# Patient Record
Sex: Female | Born: 1945 | Race: White | Hispanic: No | Smoking: Current every day smoker
Health system: Southern US, Community
[De-identification: ages and names within clinical notes are randomized; demographics above are authoritative.]

## PROBLEM LIST (undated history)

## (undated) DIAGNOSIS — I10 Essential (primary) hypertension: Secondary | ICD-10-CM

## (undated) DIAGNOSIS — Z860100 Personal history of colon polyps, unspecified: Secondary | ICD-10-CM

## (undated) DIAGNOSIS — F32A Depression, unspecified: Secondary | ICD-10-CM

## (undated) DIAGNOSIS — K219 Gastro-esophageal reflux disease without esophagitis: Secondary | ICD-10-CM

## (undated) DIAGNOSIS — K760 Fatty (change of) liver, not elsewhere classified: Secondary | ICD-10-CM

## (undated) DIAGNOSIS — J449 Chronic obstructive pulmonary disease, unspecified: Secondary | ICD-10-CM

## (undated) DIAGNOSIS — M069 Rheumatoid arthritis, unspecified: Secondary | ICD-10-CM

## (undated) DIAGNOSIS — K52832 Lymphocytic colitis: Secondary | ICD-10-CM

## (undated) DIAGNOSIS — E785 Hyperlipidemia, unspecified: Secondary | ICD-10-CM

## (undated) DIAGNOSIS — F329 Major depressive disorder, single episode, unspecified: Secondary | ICD-10-CM

## (undated) DIAGNOSIS — E039 Hypothyroidism, unspecified: Secondary | ICD-10-CM

## (undated) DIAGNOSIS — T8859XA Other complications of anesthesia, initial encounter: Secondary | ICD-10-CM

## (undated) HISTORY — PX: LEG SURGERY: SHX1003

## (undated) HISTORY — DX: Personal history of colonic polyps: Z86.010

## (undated) HISTORY — PX: GALLBLADDER SURGERY: SHX652

## (undated) HISTORY — PX: COLONOSCOPY: SHX174

## (undated) HISTORY — DX: Major depressive disorder, single episode, unspecified: F32.9

## (undated) HISTORY — DX: Hyperlipidemia, unspecified: E78.5

## (undated) HISTORY — DX: Lymphocytic colitis: K52.832

## (undated) HISTORY — DX: Depression, unspecified: F32.A

## (undated) HISTORY — PX: PARTIAL HYSTERECTOMY: SHX80

## (undated) HISTORY — DX: Gastro-esophageal reflux disease without esophagitis: K21.9

## (undated) HISTORY — DX: Hypothyroidism, unspecified: E03.9

## (undated) HISTORY — PX: OTHER SURGICAL HISTORY: SHX169

## (undated) HISTORY — DX: Personal history of colon polyps, unspecified: Z86.0100

## (undated) HISTORY — DX: Rheumatoid arthritis, unspecified: M06.9

---

## 2014-06-16 ENCOUNTER — Encounter: Payer: Self-pay | Admitting: Internal Medicine

## 2014-07-14 ENCOUNTER — Encounter: Payer: Self-pay | Admitting: Gastroenterology

## 2014-07-14 ENCOUNTER — Ambulatory Visit (INDEPENDENT_AMBULATORY_CARE_PROVIDER_SITE_OTHER): Payer: Medicare Other | Admitting: Gastroenterology

## 2014-07-14 VITALS — BP 116/70 | HR 73 | Temp 97.4°F | Ht 64.0 in | Wt 137.8 lb

## 2014-07-14 DIAGNOSIS — R1013 Epigastric pain: Secondary | ICD-10-CM

## 2014-07-14 DIAGNOSIS — R197 Diarrhea, unspecified: Secondary | ICD-10-CM | POA: Insufficient documentation

## 2014-07-14 DIAGNOSIS — R159 Full incontinence of feces: Secondary | ICD-10-CM | POA: Insufficient documentation

## 2014-07-14 DIAGNOSIS — R1030 Lower abdominal pain, unspecified: Secondary | ICD-10-CM

## 2014-07-14 DIAGNOSIS — K219 Gastro-esophageal reflux disease without esophagitis: Secondary | ICD-10-CM

## 2014-07-14 MED ORDER — COLESTIPOL HCL 1 G PO TABS: 1.0000 g | ORAL_TABLET | Freq: Two times a day (BID) | ORAL | Status: DC

## 2014-07-14 NOTE — Progress Notes (Signed)
Primary Care Physician:  HALL, ZACH, MD  Primary Gastroenterologist:  Michael Rourk, MD   Chief Complaint  Patient presents with  . Encopresis    HPI:  Jennifer [name] is a 68 y.o. female here at the request of Dr. Zach Hall for further evaluation of fecal incontinence and diarrhea. Patient recently had worsening of her baseline symptoms and therefore wanted to be evaluated again. She states she was given the diagnosis of chronic diarrhea but no etiology ever determined. Symptoms began back in 1989 around the time of her gallbladder surgery. States she had her first fecal incontinence episode while she was bathing in a bathtub. During her evaluation at that time she was found to have a gallstone and underwent cholecystectomy. 2 years after her gallbladder was removed she developed chronic diarrhea. At the worst time she was having up to 30 stools per day. Symptoms also associated with fecal incontinence. She reports being evaluated by Dr. Buccini, Dr. McKee, and someone at [facility] over the past couple of decades but not in the last 14 years. Reports having at least 6 colonoscopies and one endoscopy or possibly anorectal manometry versus biofeedback at [facility]. Seen only once at [facility] for the test. About 3 years ago she had her last colonoscopy in [place] but does not remember the name of the physician. She is going to try to get those records. States she has had colon polyps previously. States she's never had a CT scan. Denies any back injury or rectal surgery. No prior small bowel capsule study.   Patient reports having episodes of fecal incontinence while asleep. She will have the urge to have a bowel movement in before she even move she has stool without warning. She ended up going on permanent disability because of this. Wears depends at all times. She has tried multiple over-the-counter agents without relief. However if she takes decongestant Contact she gets constipation.    Currently the patient is having about 12 watery stools daily. Denies any blood in the stool. Continues to have nocturnal diarrhea. Symptoms seem to get worse when she fell out of a truck and landed on her right side. Reports that her heartburn is well-controlled. Denies dysphagia. No unintentional weight loss. No history of urinary incontinence. She has discomfort and the epigastrium and lower abdominal area. Seems to be worse with bowel movements.  Current Outpatient Prescriptions  Medication Sig Dispense Refill  . citalopram (CELEXA) 20 MG tablet Take 20 mg by mouth daily.     . levothyroxine (SYNTHROID, LEVOTHROID) 88 MCG tablet Take 88 mcg by mouth daily before breakfast.     . omeprazole (PRILOSEC) 40 MG capsule Take 40 mg by mouth daily.     . simvastatin (ZOCOR) 20 MG tablet Take 20 mg by mouth daily at 6 PM.      No current facility-administered medications for this visit.    Allergies as of 07/14/2014 - Review Complete 07/14/2014  Allergen Reaction Noted  . Desyrel [trazodone]  07/14/2014  . Keflex [cephalexin]  07/14/2014  . Klonopin [clonazepam]  07/14/2014  . Minocin [minocycline hcl]  07/14/2014  . Stelazine [trifluoperazine]  07/14/2014    Past Medical History  Diagnosis Date  . GERD (gastroesophageal reflux disease)   . Hyperlipidemia   . Hypothyroidism   . Depression     Past Surgical History  Procedure Laterality Date  . Gallbladder surgery    . Partial hysterectomy    . Leg surgery      right  .   Colonoscopy      last one was about 3 yrs in [place]    Family History  Problem Relation Age of Onset  . Colon cancer Neg Hx    patient does not know her family history very well. Not in contact with her family.  History   Social History  . Marital Status: Unknown    Spouse Name: N/A    Number of Children: 2  . Years of Education: N/A   Occupational History  . disability    Social History Main Topics  . Smoking status: Current Some Day Smoker  -- 0.50 packs/day    Types: Cigarettes  . Smokeless tobacco: Not on file  . Alcohol Use: No  . Drug Use: No  . Sexual Activity: Not on file   Other Topics Concern  . Not on file   Social History Narrative  . No narrative on file      ROS:  General: Negative for anorexia, weight loss, fever, chills, fatigue, weakness. Eyes: Negative for vision changes.  ENT: Negative for hoarseness, difficulty swallowing , nasal congestion. CV: Negative for chest pain, angina, palpitations, dyspnea on exertion, peripheral edema.  Respiratory: Negative for dyspnea at rest, dyspnea on exertion, cough, sputum, wheezing.  GI: See history of present illness. GU:  Negative for dysuria, hematuria, urinary incontinence, urinary frequency, nocturnal urination.  MS: Negative for joint pain, low back pain.  Derm: Negative for rash or itching.  Neuro: Negative for weakness, abnormal sensation, seizure, frequent headaches, memory loss, confusion.  Psych: Negative for anxiety, depression, suicidal ideation, hallucinations.  Endo: Negative for unusual weight change.  Heme: Negative for bruising or bleeding. Allergy: Negative for rash or hives.    Physical Examination:  BP 116/70 mmHg  Pulse 73  Temp(Src) 97.4 F (36.3 C) (Oral)  Ht 5' 4" (1.626 m)  Wt 137 lb 12.8 oz (62.506 kg)  BMI 23.64 kg/m2   General: Well-nourished, well-developed in no acute distress.  Head: Normocephalic, atraumatic.   Eyes: Conjunctiva pink, no icterus. Mouth: Oropharyngeal mucosa moist and pink , no lesions erythema or exudate. Neck: Supple without thyromegaly, masses, or lymphadenopathy.  Lungs: Clear to auscultation bilaterally.  Heart: Regular rate and rhythm, no murmurs rubs or gallops.  Abdomen: Bowel sounds are normal, mild tenderness in the epigastrium, lower abdomen, nondistended, no hepatosplenomegaly or masses, no abdominal bruits or    hernia , no rebound or guarding.   Rectal: Multiple skin tags noted  externally. Brown soft stool in the rectal vault, Hemoccult negative. Exam nontender. Anal rectal tone appears normal with digital insertion however when patient was asked to bear down or squeeze finger there was no notable change in squeeze Extremities: No lower extremity edema. No clubbing or deformities.  Neuro: Alert and oriented x 4 , grossly normal neurologically.  Skin: Warm and dry, no rash or jaundice.   Psych: Alert and cooperative, normal mood and affect.  

## 2014-07-14 NOTE — Assessment & Plan Note (Signed)
Chronic diarrhea associated with fecal incontinence dating back to 1989 with extensive evaluation as outlined. We have requested records although most occurred greater than 10 years ago and may not be available. She will get [place] a copy of her most recent colonoscopy which was done 3 years ago in [place], or at least get [place] the name of the physician. Differential quite broad including bile acid diarrhea, IBS, celiac, IBD, microscopic colitis, mesenteric ischemia, exocrine pancreatic insufficiency.  Labs and stool to be obtained. Retrieve records for review. Trial of Colestid 1g BID not to be taken within 2 hours of other medications. She may require endoscopic evaluation or CT based on labs and records.

## 2014-07-14 NOTE — Assessment & Plan Note (Signed)
Currently well controlled. Patient has had previous upper endoscopy. We have requested records although favor or older than 10 years they may not be available.

## 2014-07-14 NOTE — Progress Notes (Signed)
cc'ed to pcp °

## 2014-07-14 NOTE — Patient Instructions (Signed)
1. Please have your labs and stool test done. 2. Once you have submitted your stool test, you may start Colestid one capsule twice daily for diarrhea. DO NOT TAKE WITHIN 2 HOURS OF YOUR OTHER MEDICATIONS.  3. I will attempt to get your records. We may need to perform Cat scan of your abdomen or repeat a colonoscopy but we will wait for records, labs, stool test before deciding.

## 2014-07-15 LAB — COMPREHENSIVE METABOLIC PANEL
ALBUMIN: 3.7 g/dL (ref 3.5–5.2)
ALK PHOS: 100 U/L (ref 39–117)
ALT: 9 U/L (ref 0–35)
AST: 15 U/L (ref 0–37)
BUN: 8 mg/dL (ref 6–23)
CO2: 32 mEq/L (ref 19–32)
Calcium: 9.2 mg/dL (ref 8.4–10.5)
Chloride: 106 mEq/L (ref 96–112)
Creat: 0.7 mg/dL (ref 0.50–1.10)
GLUCOSE: 81 mg/dL (ref 70–99)
Potassium: 3.8 mEq/L (ref 3.5–5.3)
Sodium: 144 mEq/L (ref 135–145)
Total Bilirubin: 0.3 mg/dL (ref 0.2–1.2)
Total Protein: 6 g/dL (ref 6.0–8.3)

## 2014-07-15 LAB — CBC WITH DIFFERENTIAL/PLATELET
Basophils Absolute: 0 10*3/uL (ref 0.0–0.1)
Basophils Relative: 0 % (ref 0–1)
Eosinophils Absolute: 0.2 10*3/uL (ref 0.0–0.7)
Eosinophils Relative: 2 % (ref 0–5)
HCT: 43.1 % (ref 36.0–46.0)
HEMOGLOBIN: 14.4 g/dL (ref 12.0–15.0)
Lymphocytes Relative: 30 % (ref 12–46)
Lymphs Abs: 2.5 10*3/uL (ref 0.7–4.0)
MCH: 29.6 pg (ref 26.0–34.0)
MCHC: 33.4 g/dL (ref 30.0–36.0)
MCV: 88.7 fL (ref 78.0–100.0)
MONOS PCT: 8 % (ref 3–12)
MPV: 9.8 fL (ref 9.4–12.4)
Monocytes Absolute: 0.7 10*3/uL (ref 0.1–1.0)
NEUTROS PCT: 60 % (ref 43–77)
Neutro Abs: 5 10*3/uL (ref 1.7–7.7)
Platelets: 237 10*3/uL (ref 150–400)
RBC: 4.86 MIL/uL (ref 3.87–5.11)
RDW: 13.4 % (ref 11.5–15.5)
WBC: 8.3 10*3/uL (ref 4.0–10.5)

## 2014-07-15 LAB — IGA: IgA: 243 mg/dL (ref 69–380)

## 2014-07-15 LAB — PANCREATIC ELASTASE, FECAL

## 2014-07-15 LAB — TSH: TSH: 0.569 u[IU]/mL (ref 0.350–4.500)

## 2014-07-15 LAB — LIPASE: LIPASE: 31 U/L (ref 0–75)

## 2014-07-16 LAB — TISSUE TRANSGLUTAMINASE, IGA: TISSUE TRANSGLUTAMINASE AB, IGA: 6 U/mL — AB (ref <4)

## 2014-07-28 NOTE — Progress Notes (Signed)
Quick Note:  Please let the patient know her labs are all ok EXCEPT her celiac screen is positive. She needs EGD with RMR for SB biopsy. Encourage her to complete stool test. ______

## 2014-07-29 NOTE — Progress Notes (Signed)
Quick Note:  I called and informed pt. OK to schedule the EGD. She said she is having difficulty trying to get the stool sample. When she has go go she can hardly get to the bathroom in time . She is also living with another family and it makes it a little more difficult. Please advise! ______

## 2014-07-30 ENCOUNTER — Other Ambulatory Visit: Payer: Self-pay

## 2014-07-30 DIAGNOSIS — R109 Unspecified abdominal pain: Secondary | ICD-10-CM

## 2014-07-30 DIAGNOSIS — K900 Celiac disease: Secondary | ICD-10-CM

## 2014-07-30 DIAGNOSIS — R197 Diarrhea, unspecified: Secondary | ICD-10-CM

## 2014-08-06 NOTE — Progress Notes (Signed)
Quick Note:  Give her some recommendations for collection, ie using disposable plastic container to catch specimen, will make it quicker and easier. ______

## 2014-08-07 NOTE — Progress Notes (Signed)
Quick Note:  Patient unwilling/unable to obtain stool specimen. EGD as planned. ______

## 2014-08-07 NOTE — Progress Notes (Signed)
Quick Note:  I called pt to tell her to get the disposable container to catch the stool sample. She said that she has one but it is still not working. She said she always does it in her underwear before she makes it to the toiler. ______

## 2014-08-11 ENCOUNTER — Encounter (HOSPITAL_COMMUNITY)
Admission: RE | Disposition: A | Discharge status: Home / Self Care | Payer: Self-pay | Source: Ambulatory Visit | Attending: Internal Medicine

## 2014-08-11 ENCOUNTER — Ambulatory Visit (HOSPITAL_COMMUNITY)
Admission: RE | Admit: 2014-08-11 | Discharge: 2014-08-11 | Disposition: A | Discharge status: Home / Self Care | Payer: Medicare Other | Source: Ambulatory Visit | Attending: Internal Medicine | Admitting: Internal Medicine

## 2014-08-11 ENCOUNTER — Encounter (HOSPITAL_COMMUNITY): Payer: Self-pay | Admitting: *Deleted

## 2014-08-11 DIAGNOSIS — F329 Major depressive disorder, single episode, unspecified: Secondary | ICD-10-CM | POA: Diagnosis not present

## 2014-08-11 DIAGNOSIS — F17210 Nicotine dependence, cigarettes, uncomplicated: Secondary | ICD-10-CM | POA: Insufficient documentation

## 2014-08-11 DIAGNOSIS — Z800 Family history of malignant neoplasm of digestive organs: Secondary | ICD-10-CM | POA: Insufficient documentation

## 2014-08-11 DIAGNOSIS — R197 Diarrhea, unspecified: Secondary | ICD-10-CM

## 2014-08-11 DIAGNOSIS — Z86010 Personal history of colonic polyps: Secondary | ICD-10-CM | POA: Diagnosis not present

## 2014-08-11 DIAGNOSIS — K210 Gastro-esophageal reflux disease with esophagitis: Secondary | ICD-10-CM | POA: Insufficient documentation

## 2014-08-11 DIAGNOSIS — E039 Hypothyroidism, unspecified: Secondary | ICD-10-CM | POA: Diagnosis not present

## 2014-08-11 DIAGNOSIS — E785 Hyperlipidemia, unspecified: Secondary | ICD-10-CM | POA: Insufficient documentation

## 2014-08-11 DIAGNOSIS — K3189 Other diseases of stomach and duodenum: Secondary | ICD-10-CM | POA: Insufficient documentation

## 2014-08-11 DIAGNOSIS — K319 Disease of stomach and duodenum, unspecified: Secondary | ICD-10-CM | POA: Diagnosis not present

## 2014-08-11 DIAGNOSIS — K529 Noninfective gastroenteritis and colitis, unspecified: Secondary | ICD-10-CM | POA: Diagnosis present

## 2014-08-11 DIAGNOSIS — K449 Diaphragmatic hernia without obstruction or gangrene: Secondary | ICD-10-CM | POA: Insufficient documentation

## 2014-08-11 DIAGNOSIS — R109 Unspecified abdominal pain: Secondary | ICD-10-CM

## 2014-08-11 HISTORY — PX: ESOPHAGOGASTRODUODENOSCOPY: SHX5428

## 2014-08-11 SURGERY — EGD (ESOPHAGOGASTRODUODENOSCOPY)
Anesthesia: Moderate Sedation

## 2014-08-11 MED ORDER — MEPERIDINE HCL 100 MG/ML IJ SOLN
INTRAMUSCULAR | Status: AC
  Filled 2014-08-11: qty 2

## 2014-08-11 MED ORDER — LIDOCAINE VISCOUS 2 % MT SOLN
OROMUCOSAL | Status: AC
  Filled 2014-08-11: qty 15

## 2014-08-11 MED ORDER — MIDAZOLAM HCL 5 MG/5ML IJ SOLN
INTRAMUSCULAR | Status: AC
  Filled 2014-08-11: qty 10

## 2014-08-11 MED ORDER — MEPERIDINE HCL 100 MG/ML IJ SOLN
INTRAMUSCULAR | Status: DC | PRN
  Administered 2014-08-11: 50 mg via INTRAVENOUS
  Administered 2014-08-11: 25 mg via INTRAVENOUS

## 2014-08-11 MED ORDER — SODIUM CHLORIDE 0.9 % IV SOLN
INTRAVENOUS | Status: DC
  Administered 2014-08-11: 12:00:00 via INTRAVENOUS

## 2014-08-11 MED ORDER — ONDANSETRON HCL 4 MG/2ML IJ SOLN
INTRAMUSCULAR | Status: DC | PRN
  Administered 2014-08-11: 4 mg via INTRAVENOUS

## 2014-08-11 MED ORDER — MIDAZOLAM HCL 5 MG/5ML IJ SOLN
INTRAMUSCULAR | Status: DC | PRN
  Administered 2014-08-11 (×2): 2 mg via INTRAVENOUS

## 2014-08-11 MED ORDER — ONDANSETRON HCL 4 MG/2ML IJ SOLN
INTRAMUSCULAR | Status: AC
  Filled 2014-08-11: qty 2

## 2014-08-11 MED ORDER — STERILE WATER FOR IRRIGATION IR SOLN
Status: DC | PRN
  Administered 2014-08-11: 14:00:00

## 2014-08-11 MED ORDER — LIDOCAINE VISCOUS 2 % MT SOLN
OROMUCOSAL | Status: DC | PRN
  Administered 2014-08-11: 3 mL via OROMUCOSAL

## 2014-08-11 NOTE — Discharge Instructions (Signed)
EGD Discharge instructions Please read the instructions outlined below and refer to this sheet in the next few weeks. These discharge instructions provide you with general information on caring for yourself after you leave the hospital. Your doctor may also give you specific instructions. While your treatment has been planned according to the most current medical practices available, unavoidable complications occasionally occur. If you have any problems or questions after discharge, please call your doctor. ACTIVITY  You may resume your regular activity but move at a slower pace for the next 24 hours.   Take frequent rest periods for the next 24 hours.   Walking will help expel (get rid of) the air and reduce the bloated feeling in your abdomen.   No driving for 24 hours (because of the anesthesia (medicine) used during the test).   You may shower.   Do not sign any important legal documents or operate any machinery for 24 hours (because of the anesthesia used during the test).  NUTRITION  Drink plenty of fluids.   You may resume your normal diet.   Begin with a light meal and progress to your normal diet.   Avoid alcoholic beverages for 24 hours or as instructed by your caregiver.  MEDICATIONS  You may resume your normal medications unless your caregiver tells you otherwise.  WHAT YOU CAN EXPECT TODAY  You may experience abdominal discomfort such as a feeling of fullness or gas pains.  FOLLOW-UP  Your doctor will discuss the results of your test with you.  SEEK IMMEDIATE MEDICAL ATTENTION IF ANY OF THE FOLLOWING OCCUR:  Excessive nausea (feeling sick to your stomach) and/or vomiting.   Severe abdominal pain and distention (swelling).   Trouble swallowing.   Temperature over 101 F (37.8 C).   Rectal bleeding or vomiting of blood.     GERD information provided  Continue Nexium 40 mg daily  Further recommendations to follow pending review of pathology  report   Gastroesophageal Reflux Disease, Adult Gastroesophageal reflux disease (GERD) happens when acid from your stomach flows up into the esophagus. When acid comes in contact with the esophagus, the acid causes soreness (inflammation) in the esophagus. Over time, GERD may create small holes (ulcers) in the lining of the esophagus. CAUSES   Increased body weight. This puts pressure on the stomach, making acid rise from the stomach into the esophagus.  Smoking. This increases acid production in the stomach.  Drinking alcohol. This causes decreased pressure in the lower esophageal sphincter (valve or ring of muscle between the esophagus and stomach), allowing acid from the stomach into the esophagus.  Late evening meals and a full stomach. This increases pressure and acid production in the stomach.  A malformed lower esophageal sphincter. Sometimes, no cause is found. SYMPTOMS   Burning pain in the lower part of the mid-chest behind the breastbone and in the mid-stomach area. This may occur twice a week or more often.  Trouble swallowing.  Sore throat.  Dry cough.  Asthma-like symptoms including chest tightness, shortness of breath, or wheezing. DIAGNOSIS  Your caregiver may be able to diagnose GERD based on your symptoms. In some cases, X-rays and other tests may be done to check for complications or to check the condition of your stomach and esophagus. TREATMENT  Your caregiver may recommend over-the-counter or prescription medicines to help decrease acid production. Ask your caregiver before starting or adding any new medicines.  HOME CARE INSTRUCTIONS   Change the factors that you can control. Ask your  caregiver for guidance concerning weight loss, quitting smoking, and alcohol consumption.  Avoid foods and drinks that make your symptoms worse, such as:  Caffeine or alcoholic drinks.  Chocolate.  Peppermint or mint flavorings.  Garlic and onions.  Spicy  foods.  Citrus fruits, such as oranges, lemons, or limes.  Tomato-based foods such as sauce, chili, salsa, and pizza.  Fried and fatty foods.  Avoid lying down for the 3 hours prior to your bedtime or prior to taking a nap.  Eat small, frequent meals instead of large meals.  Wear loose-fitting clothing. Do not wear anything tight around your waist that causes pressure on your stomach.  Raise the head of your bed 6 to 8 inches with wood blocks to help you sleep. Extra pillows will not help.  Only take over-the-counter or prescription medicines for pain, discomfort, or fever as directed by your caregiver.  Do not take aspirin, ibuprofen, or other nonsteroidal anti-inflammatory drugs (NSAIDs). SEEK IMMEDIATE MEDICAL CARE IF:   You have pain in your arms, neck, jaw, teeth, or back.  Your pain increases or changes in intensity or duration.  You develop nausea, vomiting, or sweating (diaphoresis).  You develop shortness of breath, or you faint.  Your vomit is green, yellow, black, or looks like coffee grounds or blood.  Your stool is red, bloody, or black. These symptoms could be signs of other problems, such as heart disease, gastric bleeding, or esophageal bleeding. MAKE SURE YOU:   Understand these instructions.  Will watch your condition.  Will get help right away if you are not doing well or get worse. Document Released: 04/27/2005 Document Revised: 10/10/2011 Document Reviewed: 02/04/2011 [facility] Patient Information 2015 [place], [place]. This information is not intended to replace advice given to you by your health care provider. Make sure you discuss any questions you have with your health care provider.

## 2014-08-11 NOTE — Interval H&P Note (Signed)
History and Physical Interval Note:  08/11/2014 1:23 PM  Jennifer [name]  has presented today for surgery, with the diagnosis of diarrhea, abd pain, celiac screen postive  The various methods of treatment have been discussed with the patient and family. After consideration of risks, benefits and other options for treatment, the patient has consented to  Procedure(s): ESOPHAGOGASTRODUODENOSCOPY (EGD) (N/A) as a surgical intervention .  The patient's history has been reviewed, patient examined, no change in status, stable for surgery.  I have reviewed the patient's chart and labs.  Questions were answered to the patient's satisfaction.     Zetha Kuhar  Positive transglutaminase antibody.  No change.   Plan for EGD with duodenal biopsy. The risks, benefits, limitations, alternatives and imponderables have been reviewed with the patient. Potential for esophageal dilation, biopsy, etc. have also been reviewed.  Questions have been answered. All parties agreeable.

## 2014-08-11 NOTE — Op Note (Signed)
[facility] [address] [place], [address]   ENDOSCOPY PROCEDURE REPORT  PATIENT: Jennifer, [name]  MR#: [id number] BIRTHDATE: 05/02/1946 , 59  yrs. old GENDER: female ENDOSCOPIST: R.  Garfield Cornea, MD FACP FACG REFERRED BY:  Delphina Cahill, M.D. PROCEDURE DATE:  08/28/2014 PROCEDURE:  EGD w/ biopsy INDICATIONS:  Chronic diarrhea; positive TTG antibody. MEDICATIONS: Versed 4 mg IV and Demerol 75 mg IV in divided doses Xylocaine gel orally.  Zofran 4 mg IV. ASA CLASS:      Class II  CONSENT: The risks, benefits, limitations, alternatives and imponderables have been discussed.  The potential for biopsy, esophogeal dilation, etc. have also been reviewed.  Questions have been answered.  All parties agreeable.  Please see the history and physical in the medical record for more information.  DESCRIPTION OF PROCEDURE: After the risks benefits and alternatives of the procedure were thoroughly explained, informed consent was obtained.  The [id number] ([id number]) endoscope was introduced through the mouth and advanced to the second portion of the duodenum , limited by Without limitations. The instrument was slowly withdrawn as the mucosa was fully examined.    Couple of tiny distal esophageal erosions.  No Barrett's esophagus or other abnormality.  Stomach - intense patchy erythema in the antrum of uncertain significance.  No ulcer or infiltrating process.  Small hiatal hernia.  Patent pylorus.  Second and third portion of the duodenum appear normal.  Bulb appeared to be somewhat granular.  The abnormal antrum was biopsied.  Also, biopsies of bulb second and third portion of the duodenum were taken for histologic study. Retroflexed views revealed as previously described.     The scope was then withdrawn from the patient and the procedure completed.  COMPLICATIONS: There were no immediate complications.  ENDOSCOPIC IMPRESSION: Mild erosive reflux esophagitis. Small hiatal  hernia. Abnormal gastric mucosa uncertain significance. Subtly abnormal duodenal (bulbar) mucosa  -  status post multiple biopsies.  RECOMMENDATIONS: Continue Nexium 40 mg daily. Follow-up pathology.  REPEAT EXAM:  eSigned:  R. Garfield Cornea, MD Rosalita Chessman [facility] 08-28-2014 1:58 PM    CC:  CPT CODES: ICD CODES:  The ICD and CPT codes recommended by this software are interpretations from the data that the clinical staff has captured with the software.  The verification of the translation of this report to the ICD and CPT codes and modifiers is the sole responsibility of the health care institution and practicing physician where this report was generated.  [employer]. will not be held responsible for the validity of the ICD and CPT codes included on this report.  AMA assumes no liability for data contained or not contained herein. CPT is a Designer, television/film set of the [employer].  PATIENT NAME:  Jennifer, [name] MR#: [id number]

## 2014-08-11 NOTE — H&P (View-Only) (Signed)
Primary Care Physician:  Delphina Cahill, MD  Primary Gastroenterologist:  Garfield Cornea, MD   Chief Complaint  Patient presents with  . Encopresis    HPI:  Jennifer [name] is a 69 y.o. female here at the request of Dr. Delphina Cahill for further evaluation of fecal incontinence and diarrhea. Patient recently had worsening of her baseline symptoms and therefore wanted to be evaluated again. She states she was given the diagnosis of chronic diarrhea but no etiology ever determined. Symptoms began back in 1989 around the time of her gallbladder surgery. States she had her first fecal incontinence episode while she was bathing in a bathtub. During her evaluation at that time she was found to have a gallstone and underwent cholecystectomy. 2 years after her gallbladder was removed she developed chronic diarrhea. At the worst time she was having up to 30 stools per day. Symptoms also associated with fecal incontinence. She reports being evaluated by Dr. Cristina Gong, Dr. Glory Buff, and someone at [facility] over the past couple of decades but not in the last 14 years. Reports having at least 6 colonoscopies and one endoscopy or possibly anorectal manometry versus biofeedback at [facility]. Seen only once at [facility] for the test. About 3 years ago she had her last colonoscopy in [facility] but does not remember the name of the physician. She is going to try to get those records. States she has had colon polyps previously. States she's never had a CT scan. Denies any back injury or rectal surgery. No prior small bowel capsule study.   Patient reports having episodes of fecal incontinence while asleep. She will have the urge to have a bowel movement in before she even move she has stool without warning. She ended up going on permanent disability because of this. Wears depends at all times. She has tried multiple over-the-counter agents without relief. However if she takes decongestant Contact she gets constipation.    Currently the patient is having about 12 watery stools daily. Denies any blood in the stool. Continues to have nocturnal diarrhea. Symptoms seem to get worse when she fell out of a truck and landed on her right side. Reports that her heartburn is well-controlled. Denies dysphagia. No unintentional weight loss. No history of urinary incontinence. She has discomfort and the epigastrium and lower abdominal area. Seems to be worse with bowel movements.  Current Outpatient Prescriptions  Medication Sig Dispense Refill  . citalopram (CELEXA) 20 MG tablet Take 20 mg by mouth daily.     Marland Kitchen levothyroxine (SYNTHROID, LEVOTHROID) 88 MCG tablet Take 88 mcg by mouth daily before breakfast.     . omeprazole (PRILOSEC) 40 MG capsule Take 40 mg by mouth daily.     . simvastatin (ZOCOR) 20 MG tablet Take 20 mg by mouth daily at 6 PM.      No current facility-administered medications for this visit.    Allergies as of 07/14/2014 - Review Complete 07/14/2014  Allergen Reaction Noted  . Desyrel [trazodone]  07/14/2014  . Keflex [cephalexin]  07/14/2014  . Klonopin [clonazepam]  07/14/2014  . Minocin [minocycline hcl]  07/14/2014  . Stelazine [trifluoperazine]  07/14/2014    Past Medical History  Diagnosis Date  . GERD (gastroesophageal reflux disease)   . Hyperlipidemia   . Hypothyroidism   . Depression     Past Surgical History  Procedure Laterality Date  . Gallbladder surgery    . Partial hysterectomy    . Leg surgery      right  .  Colonoscopy      last one was about 3 yrs in [facility]    Family History  Problem Relation Age of Onset  . Colon cancer Neg Hx    patient does not know her family history very well. Not in contact with her family.  History   Social History  . Marital Status: Unknown    Spouse Name: N/A    Number of Children: 2  . Years of Education: N/A   Occupational History  . disability    Social History Main Topics  . Smoking status: Current Some Day Smoker  -- 0.50 packs/day    Types: Cigarettes  . Smokeless tobacco: Not on file  . Alcohol Use: No  . Drug Use: No  . Sexual Activity: Not on file   Other Topics Concern  . Not on file   Social History Narrative  . No narrative on file      ROS:  General: Negative for anorexia, weight loss, fever, chills, fatigue, weakness. Eyes: Negative for vision changes.  ENT: Negative for hoarseness, difficulty swallowing , nasal congestion. CV: Negative for chest pain, angina, palpitations, dyspnea on exertion, peripheral edema.  Respiratory: Negative for dyspnea at rest, dyspnea on exertion, cough, sputum, wheezing.  GI: See history of present illness. GU:  Negative for dysuria, hematuria, urinary incontinence, urinary frequency, nocturnal urination.  MS: Negative for joint pain, low back pain.  Derm: Negative for rash or itching.  Neuro: Negative for weakness, abnormal sensation, seizure, frequent headaches, memory loss, confusion.  Psych: Negative for anxiety, depression, suicidal ideation, hallucinations.  Endo: Negative for unusual weight change.  Heme: Negative for bruising or bleeding. Allergy: Negative for rash or hives.    Physical Examination:  BP 116/70 mmHg  Pulse 73  Temp(Src) 97.4 F (36.3 C) (Oral)  Ht 5\' 4"  (1.626 m)  Wt 137 lb 12.8 oz (62.506 kg)  BMI 23.64 kg/m2   General: Well-nourished, well-developed in no acute distress.  Head: Normocephalic, atraumatic.   Eyes: Conjunctiva pink, no icterus. Mouth: Oropharyngeal mucosa moist and pink , no lesions erythema or exudate. Neck: Supple without thyromegaly, masses, or lymphadenopathy.  Lungs: Clear to auscultation bilaterally.  Heart: Regular rate and rhythm, no murmurs rubs or gallops.  Abdomen: Bowel sounds are normal, mild tenderness in the epigastrium, lower abdomen, nondistended, no hepatosplenomegaly or masses, no abdominal bruits or    hernia , no rebound or guarding.   Rectal: Multiple skin tags noted  externally. Brown soft stool in the rectal vault, Hemoccult negative. Exam nontender. Anal rectal tone appears normal with digital insertion however when patient was asked to bear down or squeeze finger there was no notable change in squeeze Extremities: No lower extremity edema. No clubbing or deformities.  Neuro: Alert and oriented x 4 , grossly normal neurologically.  Skin: Warm and dry, no rash or jaundice.   Psych: Alert and cooperative, normal mood and affect.

## 2014-08-12 ENCOUNTER — Encounter (HOSPITAL_COMMUNITY): Payer: Self-pay | Admitting: Internal Medicine

## 2014-08-14 LAB — GIARDIA/CRYPTOSPORIDIUM (EIA)
CRYPTOSPORIDIUM SCREEN (EIA) (SOL): NEGATIVE
Giardia Screen (EIA): NEGATIVE

## 2014-08-14 LAB — CLOSTRIDIUM DIFFICILE BY PCR: CDIFFPCR: NOT DETECTED

## 2014-08-15 ENCOUNTER — Encounter: Payer: Self-pay | Admitting: Internal Medicine

## 2014-08-15 NOTE — Progress Notes (Signed)
Quick Note:  C. difficile, Giardia/Cryptosporidium negative.  Stool culture and stool elastase are pending.  ______

## 2014-08-17 LAB — STOOL CULTURE

## 2014-08-20 LAB — PANCREATIC ELASTASE, FECAL: Pancreatic Elastase-1, Stool: >500 mcg/g

## 2014-08-27 ENCOUNTER — Telehealth: Payer: Self-pay | Admitting: Internal Medicine

## 2014-08-27 MED ORDER — COLESTIPOL HCL 1 G PO TABS: 1.0000 g | ORAL_TABLET | Freq: Two times a day (BID) | ORAL | Status: DC

## 2014-08-27 NOTE — Telephone Encounter (Signed)
Noted  

## 2014-08-27 NOTE — Progress Notes (Signed)
Quick Note:  Where is the pancreatic elastase results? ______

## 2014-08-27 NOTE — Telephone Encounter (Signed)
rx re-sent to [facility]. Cancelled rx at [facility].

## 2014-08-27 NOTE — Telephone Encounter (Signed)
Patient has wanted to get her binding prescription called into [facility] in [place] and it was called into [facility] by mistake  Please advise

## 2014-11-14 ENCOUNTER — Ambulatory Visit: Payer: Medicare Other | Admitting: Gastroenterology

## 2014-12-02 ENCOUNTER — Ambulatory Visit (INDEPENDENT_AMBULATORY_CARE_PROVIDER_SITE_OTHER): Payer: Medicare Other | Admitting: Nurse Practitioner

## 2014-12-02 ENCOUNTER — Encounter: Payer: Self-pay | Admitting: Nurse Practitioner

## 2014-12-02 VITALS — BP 112/76 | HR 88 | Temp 97.6°F | Ht 64.0 in | Wt 149.4 lb

## 2014-12-02 DIAGNOSIS — R1013 Epigastric pain: Secondary | ICD-10-CM | POA: Diagnosis not present

## 2014-12-02 DIAGNOSIS — R197 Diarrhea, unspecified: Secondary | ICD-10-CM

## 2014-12-02 MED ORDER — ELUXADOLINE 75 MG PO TABS: 75.0000 mg | ORAL_TABLET | Freq: Two times a day (BID) | ORAL | Status: DC

## 2014-12-02 NOTE — Assessment & Plan Note (Addendum)
69 year old female comes in for follow-up on abdominal pain and diarrhea. Her symptoms have been long-standing for a couple decades, however she has had a worsening of her symptoms in the past 6 months. Still waiting for results of last colonoscopy 3 years ago in [facility], however the patient states it was normal when she was recommended for a 10 year follow-up colonoscopy. Her symptoms include abdominal pain which is described as crampy and lower abdomen which is relieved with a bowel movement. Has been under increased stress that she is staying with friends, sleeping on a sofa, and often awaken multiple times during night. She does note when she was separating from her husband N/A argued she would we have to use the bathroom pretty soon thereafter. Stool cultures negative, intestinal biopsy negative for celiac disease. Gluten-free diet has not helped. Is already avoiding dairy. At this point her symptoms could be attributable to irritable bowel syndrome. I will trial her on Viberzi for 2 weeks to see if a call has an improvement in her symptoms as Colestid has not improved her situation at all. We will give her 75 mg twice a day, she does not have a gallbladder. No history of pancreatitis or alcohol use/abuse. Return for follow-up in 3 months.

## 2014-12-02 NOTE — Assessment & Plan Note (Addendum)
69 year old female comes in for follow-up on abdominal pain and diarrhea. Her symptoms have been long-standing for a couple decades, however she has had a worsening of her symptoms in the past 6 months. Still waiting for results of last colonoscopy 3 years ago in [facility], however the patient states it was normal when she was recommended for a 10 year follow-up colonoscopy. Her symptoms include abdominal pain which is described as crampy and lower abdomen which is relieved with a bowel movement. Has been under increased stress that she is staying with friends, sleeping on a sofa, and often awaken multiple times during night. She does note when she was separating from her husband N/A argued she would we have to use the bathroom pretty soon thereafter. Stool cultures negative, intestinal biopsy negative for celiac disease. Gluten-free diet has not helped. Is already avoiding dairy. At this point her symptoms could be attributable to irritable bowel syndrome. I will trial her on Viberzi for 2 weeks to see if a call has an improvement in her symptoms as Colestid has not improved her situation at all. We will give her 75 mg twice a day, she does not have a gallbladder. No history of pancreatitis or alcohol use/abuse. Return for follow-up in 3 months.

## 2014-12-02 NOTE — Progress Notes (Signed)
cc'ed to pcp °

## 2014-12-02 NOTE — Patient Instructions (Signed)
1. We will give the samples of Viberzi the last 2 weeks. Take 75 mg twice a day. 2. Call us in 1-2 weeks and let us know how this is working for you. 3. If it is effective we can call in a longer-term prescription. 4. Return for follow-up in 3 months.

## 2014-12-02 NOTE — Progress Notes (Signed)
Referring Provider: Delphina Cahill, MD Primary Care Physician:  Delphina Cahill, MD Primary GI: Dr. Gala Romney  Chief Complaint  Patient presents with  . Follow-up    HPI:   69 year old female presents for follow-up on EGD, diarrhea, and fecal incontinence. She has a history of chronic diarrhea beginning back in 1989 and we saw her on 07/14/2014 she began to have worsening of her baseline symptoms. Has had symptoms ever since her cholecystectomy. At last visit she was having up to 12 watery stools daily without blood. This included nocturnal diarrhea. At that time she was set up for an EGD, trial of Colestid 1 g twice a day. Tissue transglutaminase IgA came back positive the patient was subsequently sent for EGD which found mild erosive reflux esophagitis, small hiatal hernia, abnormal gastric mucosa of uncertain significance, subtly abnormal duodenal bulbar mucosa status post multiple biopsies. Duodenum biopsy showed benign small bowel mucosa without villous blunting or increase in intraepithelial lymphocytes, stomach biopsy showed mild reactive gastropathy negative for H. pylori. The patient was recommended to continue Nexium 40 mg daily.  Today she states the Colestid has helped a little but not substantially. States she had a large nocturnal bowel movement after eating ice cream. Has a lot of sas after eating milk. Has since switched to lactose free mild. States gluten free diet did not help and subsequently stopped this. At this point the most concerning symptom is daytime fecal incontinence. Last colonoscopy was 3 years ago in [place] and no polyps found (per patient) and recommended 10 year repeat. Denies hematochezia and melena. Has lower abdominal pain which is described as crampy and is typically relieved after a bowel movement. States she feels a "knott" in her gluteal area which she can only feel when using the bathroom. Patient has noticed worsening symptoms with increased stress lately. Also  notes when she was separating from her husband, any time they argued she would have to go to the bathroom shortly thereafter. Denies any other upper or lower GI symptoms.   Past Medical History  Diagnosis Date  . GERD (gastroesophageal reflux disease)   . Hyperlipidemia   . Hypothyroidism   . Depression     Past Surgical History  Procedure Laterality Date  . Gallbladder surgery    . Partial hysterectomy    . Leg surgery      right  . Colonoscopy      last one was about 3 yrs in [place]  . Esophagogastroduodenoscopy N/A 08/11/2014    RMR: MIld erosive  reflux esophagitis. small hiatal hernia. Abnormal gastric muocsa uncertain significance. Subtly abnormal duodeanl (bulbar) mucosa- status post multiple biopsies.     Current Outpatient Prescriptions  Medication Sig Dispense Refill  . albuterol (PROVENTIL HFA;VENTOLIN HFA) 108 (90 BASE) MCG/ACT inhaler Inhale 1-2 puffs into the lungs every 6 (six) hours as needed for wheezing or shortness of breath.    Marland Kitchen aspirin 325 MG tablet Take 325 mg by mouth daily.    . cholecalciferol (VITAMIN D) 1000 UNITS tablet Take 1,000 Units by mouth daily.    . citalopram (CELEXA) 20 MG tablet Take 20 mg by mouth daily.     . colestipol (COLESTID) 1 G tablet Take 1 tablet (1 g total) by mouth 2 (two) times daily. Do not take within 2 hours of other medications. 60 tablet 3  . Fish Oil-Krill Oil (KRILL OIL PLUS PO) Take by mouth.    . fluticasone (FLONASE) 50 MCG/ACT nasal spray Place 2 sprays into both  nostrils 2 (two) times daily as needed.  1  . levothyroxine (SYNTHROID, LEVOTHROID) 88 MCG tablet Take 88 mcg by mouth daily before breakfast.     . omeprazole (PRILOSEC) 40 MG capsule Take 40 mg by mouth daily.     . simvastatin (ZOCOR) 20 MG tablet Take 20 mg by mouth daily at 6 PM.      No current facility-administered medications for this visit.    Allergies as of 12/02/2014 - Review Complete 08/11/2014  Allergen Reaction Noted  . Desyrel  [trazodone]  07/14/2014  . Keflex [cephalexin]  07/14/2014  . Klonopin [clonazepam]  07/14/2014  . Minocin [minocycline hcl]  07/14/2014  . Stelazine [trifluoperazine]  07/14/2014    Family History  Problem Relation Age of Onset  . Colon cancer Neg Hx     History   Social History  . Marital Status: Legally Separated    Spouse Name: N/A  . Number of Children: 2  . Years of Education: N/A   Occupational History  . disability    Social History Main Topics  . Smoking status: Current Some Day Smoker -- 0.50 packs/day    Types: Cigarettes  . Smokeless tobacco: Not on file  . Alcohol Use: No  . Drug Use: No  . Sexual Activity: Not on file   Other Topics Concern  . None   Social History Narrative    Review of Systems: General: Negative for anorexia, weight loss, fatigue, weakness. Eyes: Negative for vision changes.  ENT: Negative for hoarseness, difficulty swallowing. CV: Negative for chest pain, angina, palpitations, peripheral edema.  Respiratory: Negative for dyspnea at rest, cough, sputum, wheezing.  GI: See history of present illness. MS: Negative for joint pain, low back pain.  Derm: Negative for rash or itching.  Neuro: Negative for weakness, seizure, memory loss, confusion.  Psych: Negative for anxiety. Endo: Denies usual weight change.  Heme: Negative for bruising or bleeding. Allergy: Negative for rash or hives.   Physical Exam: BP 112/76 mmHg  Pulse 88  Temp(Src) 97.6 F (36.4 C) (Oral)  Ht 5\' 4"  (1.626 m)  Wt 149 lb 6.4 oz (67.767 kg)  BMI 25.63 kg/m2 General:   Alert and oriented. No distress noted. Pleasant and cooperative.  Head:  Normocephalic and atraumatic. Eyes:  Conjuctiva clear without scleral icterus. Lungs:  Clear to auscultation bilaterally. No wheezes, rales, or rhonchi. No distress.  Heart:  S1, S2 present without murmurs, rubs, or gallops. Regular rate and rhythm. Abdomen:  +BS, soft, and non-distended. Patient with TTP lower  abdomen. No rebound or guarding. No HSM or masses noted. Msk:  Symmetrical without gross deformities. Normal posture. Extremities:  Without edema. Neurologic:  Alert and  oriented x4;  grossly normal neurologically. Skin:  Intact without significant lesions or rashes. Psych:  Alert and cooperative. Normal mood and affect.    12/02/2014 9:17 AM

## 2014-12-03 ENCOUNTER — Telehealth: Payer: Self-pay | Admitting: Internal Medicine

## 2014-12-03 NOTE — Telephone Encounter (Signed)
Pt was told to not take anymore of the viberzi

## 2014-12-03 NOTE — Telephone Encounter (Signed)
Noted and agree. 

## 2014-12-03 NOTE — Telephone Encounter (Signed)
Pt called first thing this morning to let us know that she had seen EG in the office yesterday and she took one pill (Viberzi) at 9pm and had her doubled over in pain and made her sick. She said she can not take this prescription and would need something else. Please advise and call her at [phone]

## 2014-12-03 NOTE — Telephone Encounter (Signed)
I spoke with the pt. She said she took one pill at 9:00 last night and by 9:30 she started having abd pain that started under her ribs and moved to her back and then she had pain all over. She said her face went numb and she finally was able to get some sleep last night. This morning she vomited x1. No fever. She said her mouth is dry, her nose is dry, she has only urinated 1 time today, but no diarrhea. She feels like it dried up her entire body. She wants to know if there is something else she can try.

## 2014-12-04 ENCOUNTER — Telehealth: Payer: Self-pay | Admitting: Internal Medicine

## 2014-12-04 NOTE — Telephone Encounter (Signed)
PATIENT URINE STARTED LOOKING ORANGE AND WANTS TO KNOW IF VIBERZI CAN CAUSE THAT PLEASE ADVISE [phone]

## 2014-12-04 NOTE — Telephone Encounter (Signed)
Randall Hiss, do you know if this is a possible side effect? I looked it up and I didn't see it on the viberzi website.   Pt also wanted to know if there was anything else she could take for the diarrhea?

## 2014-12-19 NOTE — Telephone Encounter (Signed)
Let's get an update on patient. I don't foresee that Viberzi would cause this. Route back to [place] once we get update.

## 2014-12-22 NOTE — Telephone Encounter (Signed)
Spoke with the pt- she is doing ok but she still has diarrhea 5+ times a day. Very watery and unable to hold it most of the time. She is wearing depends.

## 2014-12-26 ENCOUNTER — Other Ambulatory Visit: Payer: Self-pay | Admitting: Nurse Practitioner

## 2014-12-26 MED ORDER — DICYCLOMINE HCL 10 MG PO CAPS: 10.0000 mg | ORAL_CAPSULE | Freq: Three times a day (TID) | ORAL | Status: DC | PRN

## 2014-12-26 NOTE — Telephone Encounter (Signed)
We can try bentyl to help her symptoms until her follow-up at which point we can explore further options. Have her call us with an update in a couple weeks.

## 2014-12-30 NOTE — Telephone Encounter (Signed)
Pt is aware. She will call in a couple of weeks and let us know how she is doing.

## 2015-03-09 ENCOUNTER — Encounter: Payer: Self-pay | Admitting: Nurse Practitioner

## 2015-03-09 ENCOUNTER — Ambulatory Visit: Payer: Medicare Other | Admitting: Nurse Practitioner

## 2015-03-09 ENCOUNTER — Telehealth: Payer: Self-pay | Admitting: Internal Medicine

## 2015-03-09 NOTE — Telephone Encounter (Signed)
PATIENT WAS A NO SHOW AND LETTER SENT  °

## 2015-03-10 NOTE — Telephone Encounter (Signed)
Noted  

## 2015-03-31 ENCOUNTER — Encounter: Payer: Self-pay | Admitting: Nurse Practitioner

## 2015-03-31 ENCOUNTER — Ambulatory Visit (INDEPENDENT_AMBULATORY_CARE_PROVIDER_SITE_OTHER): Payer: Medicare Other | Admitting: Nurse Practitioner

## 2015-03-31 VITALS — BP 117/76 | HR 75 | Temp 97.3°F | Ht 64.0 in | Wt 157.2 lb

## 2015-03-31 DIAGNOSIS — R1030 Lower abdominal pain, unspecified: Secondary | ICD-10-CM

## 2015-03-31 DIAGNOSIS — R197 Diarrhea, unspecified: Secondary | ICD-10-CM | POA: Diagnosis not present

## 2015-03-31 NOTE — Patient Instructions (Signed)
1. We will have he signed a release so we can request labs from Dr. Nevada Crane. 2. We will also have he signed a release so we can request her colonoscopy report from [facility] 3. Return for follow-up in 3 months. 4. Continue taking your medication.

## 2015-03-31 NOTE — Progress Notes (Signed)
Referring Provider: Delphina Cahill, MD Primary Care Physician:  Delphina Cahill, MD Primary GI:  Dr. Gala Romney  Chief Complaint  Patient presents with  . Follow-up    HPI:   69 year old female presents for follow-up on abdominal pain and diarrhea. On her previous note on 12/02/2014 it was noted Colestid has not improved, gluten-free diet has not improved her symptoms. She is already avoiding dairy. Stool cultures were negative, intestinal biopsy negative for celiac disease. We have yet to receive the results of her previous colonoscopy despite multiple requests per patient it was normal and recommended 10 year repeat follow-up (2023). At last visit she was trialed on Viberzi which she had immediate side effects of "doubled over" severe abdominal pain, nausea, facial numbness. She was advised to stop taking the medication and was given a trial of Bentyl until follow-up visit.  Today she states bentyl which was sent to her pharmacy did not help her symptoms. Her PCP started her on Ibuprofen and Dicyclomine and told her "I'm low on protein which is causing my symptoms." Her pain is improved somewhat, but still present. Still having diarrhea as well. Is having 5+ bowel movements daily. Denies hematochezia, melena. Her abdominal pain is currently limited to her groin/leg area. Previously was generalized and much more severe. Denies N/V, unintentional weight loss, unexplained fevers. Denies chest pain, worsening dyspnea (has baseline COPD), dizziness, lightheadedness, syncope, near syncope. Denies any other upper or lower GI symptoms. She states the last GI she saw in 1990s told her to eat as much fatty food as possible because at the time she was malnourished. Has continued this since then, is s/p cholecystectomy.  Past Medical History  Diagnosis Date  . GERD (gastroesophageal reflux disease)   . Hyperlipidemia   . Hypothyroidism   . Depression     Past Surgical History  Procedure Laterality Date  .  Gallbladder surgery    . Partial hysterectomy    . Leg surgery      right  . Colonoscopy      last one was about 3 yrs in [place]  . Esophagogastroduodenoscopy N/A 08/11/2014    RMR: MIld erosive  reflux esophagitis. small hiatal hernia. Abnormal gastric muocsa uncertain significance. Subtly abnormal duodeanl (bulbar) mucosa- status post multiple biopsies.     Current Outpatient Prescriptions  Medication Sig Dispense Refill  . albuterol (PROVENTIL HFA;VENTOLIN HFA) 108 (90 BASE) MCG/ACT inhaler Inhale 1-2 puffs into the lungs every 6 (six) hours as needed for wheezing or shortness of breath.    Marland Kitchen aspirin 325 MG tablet Take 325 mg by mouth daily.    . cholecalciferol (VITAMIN D) 1000 UNITS tablet Take 1,000 Units by mouth daily.    . citalopram (CELEXA) 20 MG tablet Take 20 mg by mouth daily.     Marland Kitchen dicyclomine (BENTYL) 10 MG capsule Take 1 capsule (10 mg total) by mouth 3 (three) times daily as needed. 90 capsule 0  . dicyclomine (BENTYL) 20 MG tablet   0  . Fish Oil-Krill Oil (KRILL OIL PLUS PO) Take by mouth.    . fluticasone (FLONASE) 50 MCG/ACT nasal spray Place 2 sprays into both nostrils 2 (two) times daily as needed.  1  . ibuprofen (ADVIL,MOTRIN) 800 MG tablet   0  . levothyroxine (SYNTHROID, LEVOTHROID) 88 MCG tablet Take 88 mcg by mouth daily before breakfast.     . omeprazole (PRILOSEC) 40 MG capsule Take 40 mg by mouth daily.     . simvastatin (ZOCOR) 20 MG  tablet Take 20 mg by mouth daily at 6 PM.     . colestipol (COLESTID) 1 G tablet Take 1 tablet (1 g total) by mouth 2 (two) times daily. Do not take within 2 hours of other medications. (Patient not taking: Reported on 03/31/2015) 60 tablet 3  . Eluxadoline (VIBERZI) 75 MG TABS Take 75 mg by mouth 2 (two) times daily. (Patient not taking: Reported on 03/31/2015) 28 tablet 0   No current facility-administered medications for this visit.    Allergies as of 03/31/2015 - Review Complete 03/31/2015  Allergen Reaction Noted    . Desyrel [trazodone]  07/14/2014  . Keflex [cephalexin]  07/14/2014  . Klonopin [clonazepam]  07/14/2014  . Minocin [minocycline hcl]  07/14/2014  . Stelazine [trifluoperazine]  07/14/2014  . Viberzi [eluxadoline]  03/31/2015    Family History  Problem Relation Age of Onset  . Colon cancer Neg Hx     Social History   Social History  . Marital Status: Legally Separated    Spouse Name: N/A  . Number of Children: 2  . Years of Education: N/A   Occupational History  . disability    Social History Main Topics  . Smoking status: Current Some Day Smoker -- 0.50 packs/day    Types: Cigarettes  . Smokeless tobacco: None  . Alcohol Use: No  . Drug Use: No  . Sexual Activity: Not Asked   Other Topics Concern  . None   Social History Narrative    Review of Systems: General: Negative for anorexia, weight loss, fever, chills, fatigue, weakness. CV: Negative for chest pain, angina, palpitations, dyspnea on exertion, peripheral edema.  Respiratory: Negative for worsening dyspnea, cough, sputum, wheezing.  GI: See history of present illness. Derm: Negative for rash or itching.  Endo: Negative for unusual weight change.  Heme: Negative for bruising or bleeding.   Physical Exam: BP 117/76 mmHg  Pulse 75  Temp(Src) 97.3 F (36.3 C)  Ht 5\' 4"  (1.626 m)  Wt 157 lb 3.2 oz (71.305 kg)  BMI 26.97 kg/m2 General:   Alert and oriented. Pleasant and cooperative. Well-nourished and well-developed.  Head:  Normocephalic and atraumatic. Cardiovascular:  S1, S2 present without murmurs appreciated. Normal pulses noted. Extremities without clubbing or edema. Respiratory:  Clear to auscultation bilaterally. No wheezes, rales, or rhonchi. No distress.  Gastrointestinal:  +BS, soft, non-tender and non-distended. No HSM noted. No guarding or rebound. No masses appreciated.  Rectal:  Deferred  Psych:  Alert and cooperative. Normal mood and affect. Heme/Lymph/Immune: No excessive bruising  noted.    03/31/2015 9:46 AM

## 2015-04-02 NOTE — Progress Notes (Signed)
CC'ED TO PCP 

## 2015-04-02 NOTE — Assessment & Plan Note (Signed)
Patient continues with diarrhea including 5 to stools a day. Viberzi had severe abdominal pain side effect and was stopped. She has been trialed on Bentyl. Workup thus far has been negative. We are re-requesting her colonoscopy report from [facility] which she states was normal and recommended 10 year follow-up. She has had her gallbladder out and continues to eat greasy high-fat foods that she was advised in the 80s. It is very possible her persistent diarrhea is related to high fat diet without gallbladder. We will also request labs which Dr. Nevada Crane drew approximately 2 weeks ago. Return for follow-up in 3 months.

## 2015-04-02 NOTE — Assessment & Plan Note (Addendum)
Patient abdominal pain has improved with dicyclomine. Also with persistent diarrhea as per above. We will re-request her colonoscopy from [facility], and her labs drawn by her PCP 2 weeks ago. Return for follow-up in 3 months. Workup thus far has been negative. Given the fact she has had her gallbladder removed recommend she decrease the fat content of her diet as this may be causing some portion of her symptoms.

## 2015-05-18 ENCOUNTER — Encounter: Payer: Self-pay | Admitting: Internal Medicine

## 2015-06-30 ENCOUNTER — Telehealth: Payer: Self-pay | Admitting: Internal Medicine

## 2015-06-30 ENCOUNTER — Ambulatory Visit: Payer: Medicare Other | Admitting: Internal Medicine

## 2015-06-30 ENCOUNTER — Encounter: Payer: Self-pay | Admitting: Internal Medicine

## 2015-06-30 NOTE — Telephone Encounter (Signed)
PATIENT WAS A NO SHOW AND LETTER SENT  °

## 2015-08-04 DIAGNOSIS — J209 Acute bronchitis, unspecified: Secondary | ICD-10-CM | POA: Diagnosis not present

## 2015-08-04 DIAGNOSIS — J019 Acute sinusitis, unspecified: Secondary | ICD-10-CM | POA: Diagnosis not present

## 2015-09-02 DIAGNOSIS — E039 Hypothyroidism, unspecified: Secondary | ICD-10-CM | POA: Diagnosis not present

## 2015-09-02 DIAGNOSIS — E782 Mixed hyperlipidemia: Secondary | ICD-10-CM | POA: Diagnosis not present

## 2015-09-04 DIAGNOSIS — J449 Chronic obstructive pulmonary disease, unspecified: Secondary | ICD-10-CM | POA: Diagnosis not present

## 2015-09-04 DIAGNOSIS — R69 Illness, unspecified: Secondary | ICD-10-CM | POA: Diagnosis not present

## 2015-09-04 DIAGNOSIS — E782 Mixed hyperlipidemia: Secondary | ICD-10-CM | POA: Diagnosis not present

## 2015-09-04 DIAGNOSIS — E46 Unspecified protein-calorie malnutrition: Secondary | ICD-10-CM | POA: Diagnosis not present

## 2015-09-04 DIAGNOSIS — K591 Functional diarrhea: Secondary | ICD-10-CM | POA: Diagnosis not present

## 2015-09-04 DIAGNOSIS — Z23 Encounter for immunization: Secondary | ICD-10-CM | POA: Diagnosis not present

## 2015-09-04 DIAGNOSIS — E039 Hypothyroidism, unspecified: Secondary | ICD-10-CM | POA: Diagnosis not present

## 2015-09-18 DIAGNOSIS — Z0100 Encounter for examination of eyes and vision without abnormal findings: Secondary | ICD-10-CM | POA: Diagnosis not present

## 2015-09-18 DIAGNOSIS — H524 Presbyopia: Secondary | ICD-10-CM | POA: Diagnosis not present

## 2015-09-28 ENCOUNTER — Ambulatory Visit (INDEPENDENT_AMBULATORY_CARE_PROVIDER_SITE_OTHER): Payer: Medicare HMO | Admitting: Gastroenterology

## 2015-09-28 ENCOUNTER — Other Ambulatory Visit: Payer: Self-pay

## 2015-09-28 ENCOUNTER — Encounter: Payer: Self-pay | Admitting: Gastroenterology

## 2015-09-28 VITALS — BP 124/72 | HR 88 | Temp 97.5°F | Ht 64.0 in | Wt 154.2 lb

## 2015-09-28 DIAGNOSIS — Z1211 Encounter for screening for malignant neoplasm of colon: Secondary | ICD-10-CM | POA: Diagnosis not present

## 2015-09-28 DIAGNOSIS — R197 Diarrhea, unspecified: Secondary | ICD-10-CM

## 2015-09-28 MED ORDER — PEG 3350-KCL-NA BICARB-NACL 420 G PO SOLR: 4000.0000 mL | ORAL | Status: DC

## 2015-09-28 NOTE — Progress Notes (Signed)
Referring Provider: Celene Squibb, MD Primary Care Physician:  Wende Neighbors, MD  Primary GI: Dr. Gala Romney   Chief Complaint  Patient presents with  . set up TCS    HPI:   Jennifer [name] is a 70 y.o. female presenting today with a history of abdominal pain and chronic diarrhea.  Diarrhea is chronic since 1989. States she sees her medication in her stool. Bentyl without improvement. Had significant pain with Viberzi. Abdomen is always uncomfortable in the upper region, cramping. Doubles her over times. Not related to eating/drinking. Just pops up whenever. Unsure what triggers it. Protein drinks tend to bind her up. Loose stools 5+ per day. Sometimes can't make it to the bathroom. Has tried multiple over the counter agents. Sometimes will happen in her sleep. No weight loss. Drinks lots of sprite. Fiber worsens symptoms. Not many good food choices due to where she lives. Her last colonoscopy was in [place], [place], at least 3 years ago. We have tried to no avail to obtain these records. She has a positive TTg, IgA antibody, but an EGD in Jan 2016 revealed on evidence of celiac disease.   Past Medical History  Diagnosis Date  . GERD (gastroesophageal reflux disease)   . Hyperlipidemia   . Hypothyroidism   . Depression     Past Surgical History  Procedure Laterality Date  . Gallbladder surgery    . Partial hysterectomy    . Leg surgery      right  . Colonoscopy      last one was about 3 yrs in [place]  . Esophagogastroduodenoscopy N/A 08/11/2014    RMR: MIld erosive  reflux esophagitis. small hiatal hernia. Abnormal gastric muocsa uncertain significance. Subtly abnormal duodeanl (bulbar) mucosa- status post multiple biopsies. BENIGN small bowel mucosa, no evidence of villous blulnting, mild reactive gastropathy on stomach biopsy     Current Outpatient Prescriptions  Medication Sig Dispense Refill  . albuterol (PROVENTIL HFA;VENTOLIN HFA) 108 (90 BASE) MCG/ACT inhaler Inhale 1-2 puffs  into the lungs every 6 (six) hours as needed for wheezing or shortness of breath.    Marland Kitchen aspirin 325 MG tablet Take 325 mg by mouth daily.    . cholecalciferol (VITAMIN D) 1000 UNITS tablet Take 1,000 Units by mouth daily.    . citalopram (CELEXA) 20 MG tablet Take 20 mg by mouth daily.     . Coenzyme Q10 (CO Q 10 PO) Take by mouth daily.    . fluticasone (FLONASE) 50 MCG/ACT nasal spray Place 2 sprays into both nostrils 2 (two) times daily as needed.  1  . levothyroxine (SYNTHROID, LEVOTHROID) 88 MCG tablet Take 88 mcg by mouth daily before breakfast.     . LOTEMAX 0.5 % GEL     . omeprazole (PRILOSEC) 40 MG capsule Take 40 mg by mouth daily.     . simvastatin (ZOCOR) 20 MG tablet Take 20 mg by mouth daily at 6 PM.     . triamcinolone cream (KENALOG) 0.1 %     . dicyclomine (BENTYL) 20 MG tablet Reported on 09/28/2015  0  . Fish Oil-Krill Oil (KRILL OIL PLUS PO) Take by mouth. Reported on 09/28/2015    . ibuprofen (ADVIL,MOTRIN) 800 MG tablet Reported on 09/28/2015  0   No current facility-administered medications for this visit.    Allergies as of 09/28/2015 - Review Complete 09/28/2015  Allergen Reaction Noted  . Desyrel [trazodone]  07/14/2014  . Keflex [cephalexin]  07/14/2014  . Klonopin [clonazepam]  07/14/2014  .  Minocin [minocycline hcl]  07/14/2014  . Stelazine [trifluoperazine]  07/14/2014  . Viberzi [eluxadoline]  03/31/2015    Family History  Problem Relation Age of Onset  . Colon cancer Neg Hx     Social History   Social History  . Marital Status: Legally Separated    Spouse Name: N/A  . Number of Children: 2  . Years of Education: N/A   Occupational History  . disability    Social History Main Topics  . Smoking status: Current Some Day Smoker -- 0.50 packs/day    Types: Cigarettes  . Smokeless tobacco: Never Used  . Alcohol Use: No  . Drug Use: No  . Sexual Activity: Not Asked   Other Topics Concern  . None   Social History Narrative    Review of  Systems: Negative unless mentioned in HPI   Physical Exam: BP 124/72 mmHg  Pulse 88  Temp(Src) 97.5 F (36.4 C)  Ht 5\' 4"  (1.626 m)  Wt 154 lb 3.2 oz (69.945 kg)  BMI 26.46 kg/m2 General:   Alert and oriented. No distress noted. Pleasant and cooperative.  Head:  Normocephalic and atraumatic. Eyes:  Conjuctiva clear without scleral icterus. Mouth:  Oral mucosa pink and moist.  Heart:  S1, S2 present without murmurs, rubs, or gallops. Regular rate and rhythm. Abdomen:  +BS, soft, non-tender and non-distended. No rebound or guarding. No HSM or masses noted. Msk:  Symmetrical without gross deformities. Normal posture. Extremities:  Without edema. Neurologic:  Alert and  oriented x4;  grossly normal neurologically. Psych:  Alert and cooperative. Normal mood and affect.

## 2015-09-28 NOTE — Patient Instructions (Signed)
I have given you samples of pancreatic enzymes. Take 1 capsule with meals (3 a day). If you notice improvement in this, then we can send in a prescription.  I have also scheduled you for a screening colonoscopy. Further recommendations to follow!

## 2015-10-07 NOTE — Progress Notes (Signed)
CC'D TO PCP °

## 2015-10-07 NOTE — Assessment & Plan Note (Signed)
70 year old female with chronic diarrhea, noting no improvement with Bentyl and had severe abdominal pain with Viberzi. Last colonoscopy in [place] per patient a few years ago, but we have been unable to retrieve these procedure notes. Differentials including IBS, bile salt diarrhea, pancreatic insufficiency, microscopic colitis, less likely malignancy. Celiac serologies positive in 2016 but EGD without small bowel biopsy negative. Favor proceeding with colonoscopy now with random colonic biopsies and adding Zenpep 40,000 units with each meal to see if she notes any improvement. She is to call for a prescription if this is helpful; samples provided.   Proceed with TCS with Dr. Gala Romney in near future: the risks, benefits, and alternatives have been discussed with the patient in detail. The patient states understanding and desires to proceed.

## 2015-10-14 ENCOUNTER — Telehealth: Payer: Self-pay

## 2015-10-14 NOTE — Telephone Encounter (Signed)
Pt is calling to cancel her TCS because she feels like she can not do it right now. I told her that she would have to come back into the office before we could reschedule her and she understood that.

## 2015-10-15 NOTE — Telephone Encounter (Signed)
Noted  

## 2015-10-22 DIAGNOSIS — Z961 Presence of intraocular lens: Secondary | ICD-10-CM | POA: Diagnosis not present

## 2015-10-22 DIAGNOSIS — H26491 Other secondary cataract, right eye: Secondary | ICD-10-CM | POA: Diagnosis not present

## 2015-10-22 DIAGNOSIS — H26493 Other secondary cataract, bilateral: Secondary | ICD-10-CM | POA: Diagnosis not present

## 2015-10-28 ENCOUNTER — Other Ambulatory Visit: Payer: Self-pay

## 2015-10-28 ENCOUNTER — Ambulatory Visit (INDEPENDENT_AMBULATORY_CARE_PROVIDER_SITE_OTHER): Payer: Medicare HMO | Admitting: Gastroenterology

## 2015-10-28 ENCOUNTER — Encounter: Payer: Self-pay | Admitting: Gastroenterology

## 2015-10-28 VITALS — BP 115/73 | HR 75 | Temp 98.1°F | Ht 64.0 in | Wt 156.0 lb

## 2015-10-28 DIAGNOSIS — R197 Diarrhea, unspecified: Secondary | ICD-10-CM

## 2015-10-28 MED ORDER — COLESTIPOL HCL 1 G PO TABS: 2.0000 g | ORAL_TABLET | Freq: Two times a day (BID) | ORAL | Status: DC

## 2015-10-28 NOTE — Progress Notes (Signed)
Referring Provider: Celene Squibb, MD Primary Care Physician:  Wende Neighbors, MD  Primary GI: Dr. Gala Romney   Chief Complaint  Patient presents with  . Colonoscopy  . Diarrhea    since 1989    HPI:   Jennifer [name] is a 70 y.o. female presenting today with a history of abdominal pain and chronic diarrhea.  Diarrhea is chronic since 1989. States she sees her medication in her stool. Bentyl without improvement. Had significant pain with Viberzi. Abdomen is always uncomfortable in the upper region, cramping. Doubles her over times. Not related to eating/drinking. Just pops up whenever. Unsure what triggers it. Protein drinks tend to bind her up. Loose stools 5+ per day. Her last colonoscopy was in [place], [place], at least 3 years ago. We have tried to no avail to obtain these records. She has a positive TTg, IgA antibody, but an EGD in Jan 2016 revealed no evidence of celiac disease. Was scheduled for colonoscopy but had to cancel due to scheduling reasons. She was prescribed Zenpep 40,000 units with each meal at last visit to see if any improvement.   Zenpep worsened acid reflux. Still had diarrhea. Has had no improvement with gluten-free diet historically. If diarrhea is "super bad" then will have abdominal discomfort but this is improved now.   Past Medical History  Diagnosis Date  . GERD (gastroesophageal reflux disease)   . Hyperlipidemia   . Hypothyroidism   . Depression     Past Surgical History  Procedure Laterality Date  . Gallbladder surgery    . Partial hysterectomy    . Leg surgery      right  . Colonoscopy      last one was about 3 yrs in [place]  . Esophagogastroduodenoscopy N/A 08/11/2014    RMR: MIld erosive  reflux esophagitis. small hiatal hernia. Abnormal gastric muocsa uncertain significance. Subtly abnormal duodeanl (bulbar) mucosa- status post multiple biopsies. BENIGN small bowel mucosa, no evidence of villous blulnting, mild reactive gastropathy on stomach  biopsy     Current Outpatient Prescriptions  Medication Sig Dispense Refill  . albuterol (PROVENTIL HFA;VENTOLIN HFA) 108 (90 BASE) MCG/ACT inhaler Inhale 1-2 puffs into the lungs every 6 (six) hours as needed for wheezing or shortness of breath.    Marland Kitchen aspirin 325 MG tablet Take 325 mg by mouth daily.    . cholecalciferol (VITAMIN D) 1000 UNITS tablet Take 1,000 Units by mouth daily.    . citalopram (CELEXA) 20 MG tablet Take 20 mg by mouth daily.     . Coenzyme Q10 (CO Q 10 PO) Take by mouth daily.    . fluticasone (FLONASE) 50 MCG/ACT nasal spray Place 2 sprays into both nostrils 2 (two) times daily as needed.  1  . levothyroxine (SYNTHROID, LEVOTHROID) 88 MCG tablet Take 88 mcg by mouth daily before breakfast.     . LOTEMAX 0.5 % GEL     . omeprazole (PRILOSEC) 40 MG capsule Take 40 mg by mouth daily.     . simvastatin (ZOCOR) 20 MG tablet Take 20 mg by mouth daily at 6 PM.     . triamcinolone cream (KENALOG) 0.1 %      No current facility-administered medications for this visit.    Allergies as of 10/28/2015 - Review Complete 10/28/2015  Allergen Reaction Noted  . Desyrel [trazodone]  07/14/2014  . Keflex [cephalexin]  07/14/2014  . Klonopin [clonazepam]  07/14/2014  . Minocin [minocycline hcl]  07/14/2014  . Stelazine [trifluoperazine]  07/14/2014  .  Viberzi [eluxadoline]  03/31/2015    Family History  Problem Relation Age of Onset  . Colon cancer Neg Hx     Social History   Social History  . Marital Status: Legally Separated    Spouse Name: N/A  . Number of Children: 2  . Years of Education: N/A   Occupational History  . disability    Social History Main Topics  . Smoking status: Current Some Day Smoker -- 0.50 packs/day    Types: Cigarettes  . Smokeless tobacco: Never Used  . Alcohol Use: No  . Drug Use: No  . Sexual Activity: Not Asked   Other Topics Concern  . None   Social History Narrative    Review of Systems: Negative unless mentioned in HPI.     Physical Exam: BP 115/73 mmHg  Pulse 75  Temp(Src) 98.1 F (36.7 C) (Oral)  Ht 5\' 4"  (1.626 m)  Wt 156 lb (70.761 kg)  BMI 26.76 kg/m2 General:   Alert and oriented. No distress noted. Pleasant and cooperative.  Head:  Normocephalic and atraumatic. Eyes:  Conjuctiva clear without scleral icterus. Mouth:  Oral mucosa pink and moist. Good dentition. No lesions. Heart:  S1, S2 present without murmurs, rubs, or gallops. Regular rate and rhythm. Abdomen:  +BS, soft, non-tender and non-distended. No rebound or guarding. No HSM or masses noted. Msk:  Symmetrical without gross deformities. Normal posture. Extremities:  Without edema. Neurologic:  Alert and  oriented x4;  grossly normal neurologically. Psych:  Alert and cooperative. Normal mood and affect.   TSH normal 07/2015

## 2015-10-28 NOTE — Patient Instructions (Signed)
I would like to recheck the celiac panel by blood work.   I have started you on Colestid to see if this helps your diarrhea. Take 2 tablets twice a day. Do not take other medications with this. You will need to take other medications an hour before or 4 hours after taking Colestid.   We have scheduled you for a colonoscopy with Dr. Gala Romney. If your blood work is significantly abnormal, we may want to do a repeat look at your upper GI tract with biopsies of small intestine.

## 2015-10-28 NOTE — Assessment & Plan Note (Signed)
70 year old female with chronic diarrhea dating back to the 80s/90s, without improvement with Bentyl or Viberzi (actually having abdominal pain with Viberzi). Last colonoscopy in [place] several years ago but unable to obtain these reports despite multiple attempts. Positive TTg, IgA antibody last year but EGD noting negative small bowel biopsy. Could have been a false positive, which is rare but could happen. To be thorough, I'm ordering a complete celiac panel. Differentials including bile salt diarrhea, pancreatic insufficiency, microscopic colitis, IBS, gluten sensitivity, less likely malignancy. She had no improvement with pancreatic enzymes.   Proceed with TCS and random colonic biopsies with Dr. Gala Romney in near future: the risks, benefits, and alternatives have been discussed with the patient in detail. The patient states understanding and desires to proceed.  Possible EGD if grossly abnormal celiac panel Empiric trial of Colestid 2 g po BID in interim Try to obtain outside labs from Dr. Nevada Crane

## 2015-10-29 ENCOUNTER — Encounter (HOSPITAL_COMMUNITY): Admission: RE | Payer: Self-pay | Source: Ambulatory Visit

## 2015-10-29 ENCOUNTER — Ambulatory Visit (HOSPITAL_COMMUNITY): Admission: RE | Admit: 2015-10-29 | Payer: Medicare HMO | Source: Ambulatory Visit | Admitting: Internal Medicine

## 2015-10-29 LAB — TISSUE TRANSGLUTAMINASE, IGA: TISSUE TRANSGLUTAMINASE AB, IGA: 1 U/mL (ref <4)

## 2015-10-29 LAB — GLIADIN ANTIBODIES, SERUM
GLIADIN IGA: 7 U (ref <20)
GLIADIN IGG: 2 U (ref <20)

## 2015-10-29 SURGERY — COLONOSCOPY
Anesthesia: Moderate Sedation

## 2015-10-29 NOTE — Progress Notes (Signed)
CC'D TO PCP °

## 2015-10-30 LAB — RETICULIN ANTIBODIES, IGA W TITER: Reticulin Ab, IgA: NEGATIVE

## 2015-10-30 NOTE — Progress Notes (Signed)
Outside labs obtained from Feb 2017. This notes BUN 6, Cr 0.84, Tbili 0.5, Alk Phos 97, AST 19, ALT 13, Albumin low at 3.3, Hgb 14.5,

## 2015-11-03 NOTE — Progress Notes (Signed)
Quick Note:  Celiac serologies negative. Proceed with colonoscopy as planned. ______

## 2015-11-09 ENCOUNTER — Ambulatory Visit (HOSPITAL_COMMUNITY): Admission: RE | Admit: 2015-11-09 | Payer: Medicare HMO | Source: Ambulatory Visit | Admitting: Internal Medicine

## 2015-11-09 ENCOUNTER — Encounter (HOSPITAL_COMMUNITY): Admission: RE | Payer: Self-pay | Source: Ambulatory Visit

## 2015-11-09 ENCOUNTER — Telehealth: Payer: Self-pay | Admitting: Internal Medicine

## 2015-11-09 SURGERY — COLONOSCOPY
Anesthesia: Moderate Sedation

## 2015-11-09 NOTE — Telephone Encounter (Signed)
Pt called this morning to let us know that she has been sick all weekend and needs to cancel her procedure today.

## 2015-11-09 NOTE — Telephone Encounter (Signed)
Noted pt is taken off schedule

## 2015-11-09 NOTE — Telephone Encounter (Signed)
Noted  

## 2015-11-09 NOTE — Telephone Encounter (Signed)
Routing to RMR to make him aware

## 2015-11-11 DIAGNOSIS — J0100 Acute maxillary sinusitis, unspecified: Secondary | ICD-10-CM | POA: Diagnosis not present

## 2015-11-11 DIAGNOSIS — R05 Cough: Secondary | ICD-10-CM | POA: Diagnosis not present

## 2015-11-11 DIAGNOSIS — R69 Illness, unspecified: Secondary | ICD-10-CM | POA: Diagnosis not present

## 2015-11-18 ENCOUNTER — Encounter: Payer: Self-pay | Admitting: Internal Medicine

## 2015-12-24 DIAGNOSIS — H26492 Other secondary cataract, left eye: Secondary | ICD-10-CM | POA: Diagnosis not present

## 2015-12-25 ENCOUNTER — Ambulatory Visit (INDEPENDENT_AMBULATORY_CARE_PROVIDER_SITE_OTHER): Payer: Medicare HMO | Admitting: Gastroenterology

## 2015-12-25 ENCOUNTER — Encounter: Payer: Self-pay | Admitting: Gastroenterology

## 2015-12-25 VITALS — BP 114/72 | HR 78 | Temp 97.5°F | Ht 64.0 in | Wt 155.0 lb

## 2015-12-25 DIAGNOSIS — R197 Diarrhea, unspecified: Secondary | ICD-10-CM

## 2015-12-25 MED ORDER — HYDROCORTISONE 2.5 % RE CREA: 1.0000 "application " | TOPICAL_CREAM | Freq: Two times a day (BID) | RECTAL | Status: DC

## 2015-12-25 NOTE — Assessment & Plan Note (Addendum)
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 She has noted improvement with Colestid 1 g po BID. Last colonoscopy in [place] close to 4 years, but we have had difficulty in attempting these reports. Most recent celiac serologies negative. EGD last year negative small biopsy. Discussion of colonoscopy raised, which I feel would be most prudent as we have never completed one here. She would like to hold off for a few weeks/months, as she is doing better. Will obtain an ifobt, and we discussed need for colonoscopy at some point in the near future but not urgently. Her sensation of fullness in rectum likely secondary to multiple hemorrhoid tags/non-thrombosed external hemorrhoids. Anusol cream provided. Internal rectal exam declined today, with last completed in Dec 2015. Return in 4 weeks for close follow-up, review hemoccult status, and likely arrange colonoscopy.   As of note, she is NOT taking Viberzi, and she understands that she is not to take this at all in the future due to post-cholecystectomy state and recent FDA cautions/contraindication warnings.

## 2015-12-25 NOTE — Progress Notes (Signed)
Referring Provider: Celene Squibb, MD Primary Care Physician:  Wende Neighbors, MD  Chief Complaint  Patient presents with  . Follow-up    viberzi    HPI:   Jennifer [name] is a 70 y.o. female presenting today with a history of abdominal pain and chronic diarrhea.  Diarrhea is chronic since 1989. States she sees her medication in her stool. Bentyl without improvement. Had significant pain with Viberzi. Abdomen is always uncomfortable in the upper region, cramping. Doubles her over times. Not related to eating/drinking. Just pops up whenever. Unsure what triggers it. Protein drinks tend to bind her up. Historically has had loose stools 5+ per day. Her last colonoscopy was in [place], [place], at least 3 years ago. We have tried to no avail to obtain these records. She has a positive TTg, IgA antibody, but an EGD in Jan 2016 revealed no evidence of celiac disease. Was scheduled for colonoscopy but had to cancel due to scheduling reasons. She was also scheduled again in April 2017 but cancelled again due to a severe sinus infection. She was prescribed Zenpep 40,000 units with each meal to see if any improvement, but this worsened reflux per her report. Recent celiac serologies rechecked, which were negative. I prescribed Colestid 2 g po BID at last visit for empiric trial. States she feels like her stool comes out more on the right side than the left.   She is here today, as the FDA recently warned of increased risk of pancreatitis with Viberzi in patients post-cholecystectomy; however, she has not taken this since prior to March. Yet, she received a letter to return to discuss management at no charge.   Here to discuss need for repeat colonoscopy for colonic biopsies. Since starting the Colestid, noted some improvement. Had a stressful moment and noted worsening of diarrhea this morning. Since starting the Colestid, almost feeling more constipated. States she feels a bulging in her rectum with stool passage,  then resolves. Last complete rectal exam in 07/2014. At that time, no mass noted. Multiple external hemorrhoid tags appreciated. She desires to wait on internal rectal exam today but is agreeable to an external exam, due to fear of precipitating diarrhea with rectal stimulation.   Past Medical History  Diagnosis Date  . GERD (gastroesophageal reflux disease)   . Hyperlipidemia   . Hypothyroidism   . Depression     Past Surgical History  Procedure Laterality Date  . Gallbladder surgery    . Partial hysterectomy    . Leg surgery      right  . Colonoscopy      last one was about 3 yrs in [place]  . Esophagogastroduodenoscopy N/A 08/11/2014    RMR: MIld erosive  reflux esophagitis. small hiatal hernia. Abnormal gastric muocsa uncertain significance. Subtly abnormal duodeanl (bulbar) mucosa- status post multiple biopsies. BENIGN small bowel mucosa, no evidence of villous blulnting, mild reactive gastropathy on stomach biopsy     Current Outpatient Prescriptions  Medication Sig Dispense Refill  . albuterol (PROVENTIL HFA;VENTOLIN HFA) 108 (90 BASE) MCG/ACT inhaler Inhale 1-2 puffs into the lungs every 6 (six) hours as needed for wheezing or shortness of breath.    Marland Kitchen aspirin 325 MG tablet Take 325 mg by mouth daily.    . citalopram (CELEXA) 20 MG tablet Take 20 mg by mouth daily.     . Coenzyme Q10 (CO Q 10 PO) Take by mouth daily.    . colestipol (COLESTID) 1 g tablet Take 2 tablets (2 g  total) by mouth 2 (two) times daily. 120 tablet 3  . fluticasone (FLONASE) 50 MCG/ACT nasal spray Place 2 sprays into both nostrils 2 (two) times daily as needed.  1  . hydrochlorothiazide (HYDRODIURIL) 25 MG tablet Take 25 mg by mouth daily.    Marland Kitchen KRILL OIL OMEGA-3 PO Take by mouth.    . levothyroxine (SYNTHROID, LEVOTHROID) 88 MCG tablet Take 88 mcg by mouth daily before breakfast.     . LOTEMAX 0.5 % GEL     . omeprazole (PRILOSEC) 40 MG capsule Take 40 mg by mouth daily.     . simvastatin (ZOCOR)  20 MG tablet Take 20 mg by mouth daily at 6 PM.     . triamcinolone cream (KENALOG) 0.1 %     . cholecalciferol (VITAMIN D) 1000 UNITS tablet Take 1,000 Units by mouth daily. Reported on 12/25/2015     No current facility-administered medications for this visit.    Allergies as of 12/25/2015 - Review Complete 12/25/2015  Allergen Reaction Noted  . Desyrel [trazodone]  07/14/2014  . Keflex [cephalexin]  07/14/2014  . Klonopin [clonazepam]  07/14/2014  . Minocin [minocycline hcl]  07/14/2014  . Stelazine [trifluoperazine]  07/14/2014  . Viberzi [eluxadoline]  03/31/2015    Family History  Problem Relation Age of Onset  . Colon cancer Neg Hx     Social History   Social History  . Marital Status: Legally Separated    Spouse Name: N/A  . Number of Children: 2  . Years of Education: N/A   Occupational History  . disability    Social History Main Topics  . Smoking status: Current Some Day Smoker -- 0.50 packs/day    Types: Cigarettes  . Smokeless tobacco: Never Used  . Alcohol Use: No  . Drug Use: No  . Sexual Activity: Not Asked   Other Topics Concern  . None   Social History Narrative    Review of Systems: As mentioned in HPI.   Physical Exam: BP 114/72 mmHg  Pulse 78  Temp(Src) 97.5 F (36.4 C) (Oral)  Ht 5\' 4"  (1.626 m)  Wt 155 lb (70.308 kg)  BMI 26.59 kg/m2 General:   Alert and oriented. No distress noted. Pleasant and cooperative.  Head:  Normocephalic and atraumatic. Eyes:  Conjuctiva clear without scleral icterus. Abdomen:  +BS, soft, non-tender and non-distended. No rebound or guarding. No HSM or masses noted Rectal: EXTERNAL EXAM ONLY per patient's request. Multiple external hemorrhoid tags with non-thrombosed external hemorrhoids appreciated. No obvious fissure or concerning abnormalities.  Msk:  Symmetrical without gross deformities. Normal posture. Extremities:  Without edema. Neurologic:  Alert and  oriented x4;  grossly normal  neurologically. Psych:  Alert and cooperative. Normal mood and affect.

## 2015-12-25 NOTE — Patient Instructions (Addendum)
Decrease Colestid to 1 tablet twice a day. I have have sent in a cream to use per your rectum twice a day for 7 days.   Please complete the stool sample right before you come back in 4 weeks, so we can result this in the office.   We will decide at that time if you need a colonoscopy.  I am glad you are doing better!

## 2015-12-29 NOTE — Progress Notes (Signed)
cc'ed to pcp °

## 2016-01-05 ENCOUNTER — Ambulatory Visit (INDEPENDENT_AMBULATORY_CARE_PROVIDER_SITE_OTHER): Payer: Medicare HMO

## 2016-01-05 DIAGNOSIS — R197 Diarrhea, unspecified: Secondary | ICD-10-CM | POA: Diagnosis not present

## 2016-01-05 LAB — IFOBT (OCCULT BLOOD): IMMUNOLOGICAL FECAL OCCULT BLOOD TEST: NEGATIVE

## 2016-01-05 NOTE — Progress Notes (Signed)
IFOBT negative 

## 2016-01-13 NOTE — Progress Notes (Signed)
Quick Note:  Heme negative. Will discuss +/- colonoscopy at next office visit. ______

## 2016-01-28 ENCOUNTER — Encounter: Payer: Self-pay | Admitting: Gastroenterology

## 2016-01-28 ENCOUNTER — Ambulatory Visit (INDEPENDENT_AMBULATORY_CARE_PROVIDER_SITE_OTHER): Payer: Medicare HMO | Admitting: Gastroenterology

## 2016-01-28 ENCOUNTER — Other Ambulatory Visit: Payer: Self-pay

## 2016-01-28 VITALS — BP 113/71 | HR 85 | Temp 98.3°F | Ht 64.0 in | Wt 155.2 lb

## 2016-01-28 DIAGNOSIS — H1011 Acute atopic conjunctivitis, right eye: Secondary | ICD-10-CM | POA: Diagnosis not present

## 2016-01-28 DIAGNOSIS — Z86010 Personal history of colonic polyps: Secondary | ICD-10-CM | POA: Diagnosis not present

## 2016-01-28 DIAGNOSIS — R197 Diarrhea, unspecified: Secondary | ICD-10-CM | POA: Diagnosis not present

## 2016-01-28 NOTE — Progress Notes (Signed)
cc'ed to pcp °

## 2016-01-28 NOTE — Patient Instructions (Signed)
We have scheduled you for a colonoscopy with Dr. Gala Romney in the near future.   I am so glad you are doing well!! Good luck on the move!

## 2016-01-28 NOTE — Progress Notes (Signed)
Referring Provider: Celene Squibb, MD Primary Care Physician:  Wende Neighbors, MD Primary GI: Dr. Gala Romney   Chief Complaint  Patient presents with  . Follow-up    Doing well    HPI:   Jennifer [name] is a 70 y.o. female presenting today with a history of chronic diarrhea dating back to the 80s/90s, with improvement taking Colestid that was started recently. Last colonoscopy in [place] close to 4 years ago, but we have had difficulty obtaining any records. EGD last year negative small bowel biopsy and celiac serologies negative. ifobt negative. I have discussed a colonoscopy as she has not had one here, has a history of polyps, and it's really unclear the date of last colonoscopy. She declined at last appt, and she is here to discuss proceeding now. Her weight is stable.   If takes 2 pills in the morning, she is fine the rest of the day. Has a normal BM once a day this way. No rectal bleeding. No abdominal pain. No sensation of fullness in rectum, with improvement in this after taking Anusol cream.   Past Medical History  Diagnosis Date  . GERD (gastroesophageal reflux disease)   . Hyperlipidemia   . Hypothyroidism   . Depression   . History of colon polyps     remote past, unable to retrieve records     Past Surgical History  Procedure Laterality Date  . Gallbladder surgery    . Partial hysterectomy    . Leg surgery      right  . Colonoscopy      last one was about 3 yrs in [place]  . Esophagogastroduodenoscopy N/A 08/11/2014    RMR: MIld erosive  reflux esophagitis. small hiatal hernia. Abnormal gastric muocsa uncertain significance. Subtly abnormal duodeanl (bulbar) mucosa- status post multiple biopsies. BENIGN small bowel mucosa, no evidence of villous blulnting, mild reactive gastropathy on stomach biopsy     Current Outpatient Prescriptions  Medication Sig Dispense Refill  . albuterol (PROVENTIL HFA;VENTOLIN HFA) 108 (90 BASE) MCG/ACT inhaler Inhale 1-2 puffs into the  lungs every 6 (six) hours as needed for wheezing or shortness of breath.    Marland Kitchen aspirin 325 MG tablet Take 325 mg by mouth daily.    . citalopram (CELEXA) 20 MG tablet Take 20 mg by mouth daily.     . Coenzyme Q10 (CO Q 10 PO) Take by mouth daily.    . colestipol (COLESTID) 1 g tablet Take 2 tablets (2 g total) by mouth 2 (two) times daily. 120 tablet 3  . fluticasone (FLONASE) 50 MCG/ACT nasal spray Place 2 sprays into both nostrils 2 (two) times daily as needed.  1  . hydrocortisone (ANUSOL-HC) 2.5 % rectal cream Place 1 application rectally 2 (two) times daily. 30 g 1  . levothyroxine (SYNTHROID, LEVOTHROID) 88 MCG tablet Take 88 mcg by mouth daily before breakfast.     . LOTEMAX 0.5 % GEL Reported on 01/28/2016    . omeprazole (PRILOSEC) 40 MG capsule Take 40 mg by mouth daily.     . simvastatin (ZOCOR) 20 MG tablet Take 20 mg by mouth daily at 6 PM.     . triamcinolone cream (KENALOG) 0.1 %      No current facility-administered medications for this visit.    Allergies as of 01/28/2016 - Review Complete 01/28/2016  Allergen Reaction Noted  . Desyrel [trazodone]  07/14/2014  . Keflex [cephalexin]  07/14/2014  . Klonopin [clonazepam]  07/14/2014  . Minocin [minocycline hcl]  07/14/2014  . Stelazine [trifluoperazine]  07/14/2014  . Viberzi [eluxadoline]  03/31/2015    Family History  Problem Relation Age of Onset  . Colon cancer Neg Hx     Social History   Social History  . Marital Status: Legally Separated    Spouse Name: N/A  . Number of Children: 2  . Years of Education: N/A   Occupational History  . disability    Social History Main Topics  . Smoking status: Current Some Day Smoker -- 0.50 packs/day    Types: Cigarettes  . Smokeless tobacco: Never Used     Comment: almost a pack a day  . Alcohol Use: No  . Drug Use: No  . Sexual Activity: Not Asked   Other Topics Concern  . None   Social History Narrative    Review of Systems: As mentioned in HPI    Physical Exam: BP 113/71 mmHg  Pulse 85  Temp(Src) 98.3 F (36.8 C) (Oral)  Ht 5\' 4"  (1.626 m)  Wt 155 lb 3.2 oz (70.398 kg)  BMI 26.63 kg/m2 General:   Alert and oriented. No distress noted. Pleasant and cooperative.  Head:  Normocephalic and atraumatic. Eyes:  Conjuctiva clear without scleral icterus. Heart:  S1, S2 present without murmurs, rubs, or gallops. Regular rate and rhythm. Abdomen:  +BS, soft, non-tender and non-distended. No rebound or guarding. No HSM or masses noted. Msk:  Symmetrical without gross deformities. Normal posture. Extremities:  Without edema. Neurologic:  Alert and  oriented x4;  grossly normal neurologically. Psych:  Alert and cooperative. Normal mood and affect.

## 2016-01-28 NOTE — Assessment & Plan Note (Signed)
Doing quite well with Colestid. Continue current plan. Colonoscopy for polyp surveillance.

## 2016-01-28 NOTE — Assessment & Plan Note (Signed)
70 year old female with a remote history of colon polyps, last colonoscopy in [place] at least 4 years ago per patient, with inability to obtain these records despite multiple requests. No rectal bleeding and recently heme negative. History of chronic diarrhea for 30+ years and symptomatic improvement with Colestid. Colonoscopy now to be arranged for surveillance purposes due to history of polyps, as we have never performed one here, and it is quite unclear how long ago her last one actually was nor what the findings were.   Proceed with TCS with Dr. Gala Romney in near future: the risks, benefits, and alternatives have been discussed with the patient in detail. The patient states understanding and desires to proceed.

## 2016-01-29 ENCOUNTER — Ambulatory Visit: Payer: Medicare HMO | Admitting: Gastroenterology

## 2016-02-03 DIAGNOSIS — L2389 Allergic contact dermatitis due to other agents: Secondary | ICD-10-CM | POA: Diagnosis not present

## 2016-02-03 DIAGNOSIS — H26492 Other secondary cataract, left eye: Secondary | ICD-10-CM | POA: Diagnosis not present

## 2016-02-13 ENCOUNTER — Other Ambulatory Visit: Payer: Self-pay | Admitting: Gastroenterology

## 2016-02-19 ENCOUNTER — Encounter (HOSPITAL_COMMUNITY)
Admission: RE | Disposition: A | Discharge status: Home / Self Care | Payer: Self-pay | Source: Ambulatory Visit | Attending: Internal Medicine

## 2016-02-19 ENCOUNTER — Encounter (HOSPITAL_COMMUNITY): Payer: Self-pay | Admitting: *Deleted

## 2016-02-19 ENCOUNTER — Ambulatory Visit (HOSPITAL_COMMUNITY)
Admission: RE | Admit: 2016-02-19 | Discharge: 2016-02-19 | Disposition: A | Discharge status: Home / Self Care | Payer: Medicare HMO | Source: Ambulatory Visit | Attending: Internal Medicine | Admitting: Internal Medicine

## 2016-02-19 DIAGNOSIS — Z1211 Encounter for screening for malignant neoplasm of colon: Secondary | ICD-10-CM | POA: Diagnosis not present

## 2016-02-19 DIAGNOSIS — Z7982 Long term (current) use of aspirin: Secondary | ICD-10-CM | POA: Insufficient documentation

## 2016-02-19 DIAGNOSIS — F17210 Nicotine dependence, cigarettes, uncomplicated: Secondary | ICD-10-CM | POA: Diagnosis not present

## 2016-02-19 DIAGNOSIS — E039 Hypothyroidism, unspecified: Secondary | ICD-10-CM | POA: Diagnosis not present

## 2016-02-19 DIAGNOSIS — R69 Illness, unspecified: Secondary | ICD-10-CM | POA: Diagnosis not present

## 2016-02-19 DIAGNOSIS — Z86010 Personal history of colonic polyps: Secondary | ICD-10-CM | POA: Diagnosis not present

## 2016-02-19 DIAGNOSIS — E785 Hyperlipidemia, unspecified: Secondary | ICD-10-CM | POA: Diagnosis not present

## 2016-02-19 DIAGNOSIS — D122 Benign neoplasm of ascending colon: Secondary | ICD-10-CM

## 2016-02-19 DIAGNOSIS — K529 Noninfective gastroenteritis and colitis, unspecified: Secondary | ICD-10-CM | POA: Diagnosis not present

## 2016-02-19 DIAGNOSIS — K5730 Diverticulosis of large intestine without perforation or abscess without bleeding: Secondary | ICD-10-CM | POA: Insufficient documentation

## 2016-02-19 HISTORY — PX: COLONOSCOPY: SHX5424

## 2016-02-19 SURGERY — COLONOSCOPY
Anesthesia: Moderate Sedation

## 2016-02-19 MED ORDER — LIDOCAINE HCL 2 % EX GEL
CUTANEOUS | Status: AC
  Filled 2016-02-19: qty 30

## 2016-02-19 MED ORDER — MEPERIDINE HCL 100 MG/ML IJ SOLN
INTRAMUSCULAR | Status: AC
  Filled 2016-02-19: qty 2

## 2016-02-19 MED ORDER — MIDAZOLAM HCL 5 MG/5ML IJ SOLN
INTRAMUSCULAR | Status: DC | PRN
  Administered 2016-02-19: 2 mg via INTRAVENOUS
  Administered 2016-02-19 (×3): 1 mg via INTRAVENOUS

## 2016-02-19 MED ORDER — SODIUM CHLORIDE 0.9 % IV SOLN
INTRAVENOUS | Status: DC
  Administered 2016-02-19: 08:00:00 via INTRAVENOUS

## 2016-02-19 MED ORDER — MIDAZOLAM HCL 5 MG/5ML IJ SOLN
INTRAMUSCULAR | Status: AC
  Filled 2016-02-19: qty 10

## 2016-02-19 MED ORDER — ONDANSETRON HCL 4 MG/2ML IJ SOLN
INTRAMUSCULAR | Status: DC | PRN
Start: 2016-02-19 — End: 2016-02-19
  Administered 2016-02-19: 4 mg via INTRAVENOUS

## 2016-02-19 MED ORDER — STERILE WATER FOR IRRIGATION IR SOLN
Status: DC | PRN
  Administered 2016-02-19: 09:00:00

## 2016-02-19 MED ORDER — ONDANSETRON HCL 4 MG/2ML IJ SOLN
INTRAMUSCULAR | Status: AC
  Filled 2016-02-19: qty 2

## 2016-02-19 MED ORDER — MEPERIDINE HCL 100 MG/ML IJ SOLN
INTRAMUSCULAR | Status: DC | PRN
  Administered 2016-02-19: 25 mg via INTRAVENOUS
  Administered 2016-02-19: 50 mg via INTRAVENOUS

## 2016-02-19 NOTE — Interval H&P Note (Signed)
History and Physical Interval Note:  02/19/2016 8:43 AM  Jennifer [name]  has presented today for surgery, with the diagnosis of colon polyps  The various methods of treatment have been discussed with the patient and family. After consideration of risks, benefits and other options for treatment, the patient has consented to  Procedure(s) with comments: COLONOSCOPY (N/A) - 845 as a surgical intervention .  The patient's history has been reviewed, patient examined, no change in status, stable for surgery.  I have reviewed the patient's chart and labs.  Questions were answered to the patient's satisfaction.     Jennifer [name]  No change. Diagnostic colonoscopy were planned.  The risks, benefits, limitations, alternatives and imponderables have been reviewed with the patient. Questions have been answered. All parties are agreeable.

## 2016-02-19 NOTE — Discharge Instructions (Signed)
°  Colonoscopy Discharge Instructions  Read the instructions outlined below and refer to this sheet in the next few weeks. These discharge instructions provide you with general information on caring for yourself after you leave the hospital. Your doctor may also give you specific instructions. While your treatment has been planned according to the most current medical practices available, unavoidable complications occasionally occur. If you have any problems or questions after discharge, call Dr. Gala Romney at [phone]. ACTIVITY  You may resume your regular activity, but move at a slower pace for the next 24 hours.   Take frequent rest periods for the next 24 hours.   Walking will help get rid of the air and reduce the bloated feeling in your belly (abdomen).   No driving for 24 hours (because of the medicine (anesthesia) used during the test).    Do not sign any important legal documents or operate any machinery for 24 hours (because of the anesthesia used during the test).  NUTRITION  Drink plenty of fluids.   You may resume your normal diet as instructed by your doctor.   Begin with a light meal and progress to your normal diet. Heavy or fried foods are harder to digest and may make you feel sick to your stomach (nauseated).   Avoid alcoholic beverages for 24 hours or as instructed.  MEDICATIONS  You may resume your normal medications unless your doctor tells you otherwise.  WHAT YOU CAN EXPECT TODAY  Some feelings of bloating in the abdomen.   Passage of more gas than usual.   Spotting of blood in your stool or on the toilet paper.  IF YOU HAD POLYPS REMOVED DURING THE COLONOSCOPY:  No aspirin products for 7 days or as instructed.   No alcohol for 7 days or as instructed.   Eat a soft diet for the next 24 hours.  FINDING OUT THE RESULTS OF YOUR TEST Not all test results are available during your visit. If your test results are not back during the visit, make an appointment  with your caregiver to find out the results. Do not assume everything is normal if you have not heard from your caregiver or the medical facility. It is important for you to follow up on all of your test results.  SEEK IMMEDIATE MEDICAL ATTENTION IF:  You have more than a spotting of blood in your stool.   Your belly is swollen (abdominal distention).   You are nauseated or vomiting.   You have a temperature over 101.   You have abdominal pain or discomfort that is severe or gets worse throughout the day.    Diverticulosis of colon polyp information provided  Further recommendations to follow pending review of pathology report

## 2016-02-19 NOTE — Op Note (Signed)
[facility] Patient Name: Jennifer [name] Procedure Date: 02/19/2016 8:28 AM MRN: [id number] Date of Birth: April 05, 1946 Attending MD: Norvel Richards , MD CSN: [id number] Age: 70 Admit Type: Outpatient Procedure:                Ileo-colonoscopy multiple snare polypectomies Indications:              High risk colon cancer surveillance: Personal                            history of colonic polyps Providers:                Norvel Richards, MD Referring MD:              Medicines:                Meperidine 75 mg IV, Midazolam 5 mg IV, Ondansetron                            4 mg IV Complications:            No immediate complications. Estimated Blood Loss:     Estimated blood loss was minimal. Procedure:                Pre-Anesthesia Assessment:                           - Prior to the procedure, a History and Physical                            was performed, and patient medications and                            allergies were reviewed. The patient's tolerance of                            previous anesthesia was also reviewed. The risks                            and benefits of the procedure and the sedation                            options and risks were discussed with the patient.                            All questions were answered, and informed consent                            was obtained. Prior Anticoagulants: The patient has                            taken no previous anticoagulant or antiplatelet                            agents. ASA Grade Assessment: II - A patient with  mild systemic disease. After reviewing the risks                            and benefits, the patient was deemed in                            satisfactory condition to undergo the procedure.                           - Prior to the procedure, a History and Physical                            was performed, and patient medications and   allergies were reviewed. The patient's tolerance of                            previous anesthesia was also reviewed. The risks                            and benefits of the procedure and the sedation                            options and risks were discussed with the patient.                            All questions were answered, and informed consent                            was obtained. Prior Anticoagulants: The patient has                            taken no previous anticoagulant or antiplatelet                            agents. ASA Grade Assessment: II - A patient with                            mild systemic disease. After reviewing the risks                            and benefits, the patient was deemed in                            satisfactory condition to undergo the procedure.                           After obtaining informed consent, the colonoscope                            was passed under direct vision. Throughout the                            procedure, the patient's blood pressure, pulse, and  oxygen saturations were monitored continuously. The                            colonoscopy was performed without difficulty. The                            patient tolerated the procedure well. The quality                            of the bowel preparation was adequate. The                            ileocecal valve, appendiceal orifice, and rectum                            were photographed. The patient tolerated the                            procedure well. The quality of the bowel                            preparation was adequate. The [id number])                            was introduced through the and advanced to the 5 cm                            into the ileum. Scope In: 8:58:21 AM Scope Out: 9:19:36 AM Scope Withdrawal Time: 0 hours 17 minutes 7 seconds  Total Procedure Duration: 0 hours 21 minutes 15 seconds  Findings:       The perianal and digital rectal examinations were normal.      Multiple medium-mouthed diverticula were found in the sigmoid colon.      Three semi-pedunculated polyps were found in the ascending colon. The       polyps were 3 to 6 mm in size. These polyps were removed with a cold       snare. Resection and retrieval were complete. Estimated blood loss was       minimal.      The exam was otherwise without abnormality on direct and retroflexion       views. segmental biopsies of the ascending and descending/sigmoid       segments to taken to evaluate for microscopic colitis Impression:               status post segmental biopsy.                           - Diverticulosis in the sigmoid colon.                           - Three 3 to 6 mm polyps in the ascending colon,                            removed with a cold snare. Resected and retrieved.                           -  The examination was otherwise normal on direct                            and retroflexion views. Moderate Sedation:      Moderate (conscious) sedation was administered by the endoscopy nurse       and supervised by the endoscopist. The following parameters were       monitored: oxygen saturation, heart rate, blood pressure, respiratory       rate, EKG, adequacy of pulmonary ventilation, and response to care.       Total physician intraservice time was 33 minutes. Recommendation:           - Patient has a contact number available for                            emergencies. The signs and symptoms of potential                            delayed complications were discussed with the                            patient. Return to normal activities tomorrow.                            Written discharge instructions were provided to the                            patient.                           - Advance diet as tolerated.                           - Continue present medications.                           - Repeat colonoscopy  date to be determined after                            pending pathology results are reviewed for                            surveillance based on pathology results.                           - Return to GI clinic after studies are complete. Procedure Code(s):        --- Professional ---                           [phone], Moderate sedation services provided by the                            same physician or other qualified health care                            professional performing the diagnostic or  therapeutic service that the sedation supports,                            requiring the presence of an independent trained                            observer to assist in the monitoring of the                            patient's level of consciousness and physiological                            status; initial 15 minutes of intraservice time,                            patient age 65 years or older                           [phone], Moderate sedation services; each additional                            15 minutes intraservice time Diagnosis Code(s):        --- Professional ---                           Z86.010, Personal history of colonic polyps                           D12.2, Benign neoplasm of ascending colon                           K57.30, Diverticulosis of large intestine without                            perforation or abscess without bleeding CPT copyright 2016 [employer]. All rights reserved. The codes documented in this report are preliminary and upon coder review may  be revised to meet current compliance requirements. Cristopher Estimable. [place] Pettengill, MD Norvel Richards, MD 02/19/2016 9:32:13 AM This report has been signed electronically. Number of Addenda: 0

## 2016-02-19 NOTE — H&P (View-Only) (Signed)
Referring Provider: Celene Squibb, MD Primary Care Physician:  Wende Neighbors, MD Primary GI: Dr. Gala Romney   Chief Complaint  Patient presents with  . Follow-up    Doing well    HPI:   Jennifer [name] is a 70 y.o. female presenting today with a history of chronic diarrhea dating back to the 80s/90s, with improvement taking Colestid that was started recently. Last colonoscopy in [place] close to 4 years ago, but we have had difficulty obtaining any records. EGD last year negative small bowel biopsy and celiac serologies negative. ifobt negative. I have discussed a colonoscopy as she has not had one here, has a history of polyps, and it's really unclear the date of last colonoscopy. She declined at last appt, and she is here to discuss proceeding now. Her weight is stable.   If takes 2 pills in the morning, she is fine the rest of the day. Has a normal BM once a day this way. No rectal bleeding. No abdominal pain. No sensation of fullness in rectum, with improvement in this after taking Anusol cream.   Past Medical History  Diagnosis Date  . GERD (gastroesophageal reflux disease)   . Hyperlipidemia   . Hypothyroidism   . Depression   . History of colon polyps     remote past, unable to retrieve records     Past Surgical History  Procedure Laterality Date  . Gallbladder surgery    . Partial hysterectomy    . Leg surgery      right  . Colonoscopy      last one was about 3 yrs in [place]  . Esophagogastroduodenoscopy N/A 08/11/2014    RMR: MIld erosive  reflux esophagitis. small hiatal hernia. Abnormal gastric muocsa uncertain significance. Subtly abnormal duodeanl (bulbar) mucosa- status post multiple biopsies. BENIGN small bowel mucosa, no evidence of villous blulnting, mild reactive gastropathy on stomach biopsy     Current Outpatient Prescriptions  Medication Sig Dispense Refill  . albuterol (PROVENTIL HFA;VENTOLIN HFA) 108 (90 BASE) MCG/ACT inhaler Inhale 1-2 puffs into the  lungs every 6 (six) hours as needed for wheezing or shortness of breath.    Marland Kitchen aspirin 325 MG tablet Take 325 mg by mouth daily.    . citalopram (CELEXA) 20 MG tablet Take 20 mg by mouth daily.     . Coenzyme Q10 (CO Q 10 PO) Take by mouth daily.    . colestipol (COLESTID) 1 g tablet Take 2 tablets (2 g total) by mouth 2 (two) times daily. 120 tablet 3  . fluticasone (FLONASE) 50 MCG/ACT nasal spray Place 2 sprays into both nostrils 2 (two) times daily as needed.  1  . hydrocortisone (ANUSOL-HC) 2.5 % rectal cream Place 1 application rectally 2 (two) times daily. 30 g 1  . levothyroxine (SYNTHROID, LEVOTHROID) 88 MCG tablet Take 88 mcg by mouth daily before breakfast.     . LOTEMAX 0.5 % GEL Reported on 01/28/2016    . omeprazole (PRILOSEC) 40 MG capsule Take 40 mg by mouth daily.     . simvastatin (ZOCOR) 20 MG tablet Take 20 mg by mouth daily at 6 PM.     . triamcinolone cream (KENALOG) 0.1 %      No current facility-administered medications for this visit.    Allergies as of 01/28/2016 - Review Complete 01/28/2016  Allergen Reaction Noted  . Desyrel [trazodone]  07/14/2014  . Keflex [cephalexin]  07/14/2014  . Klonopin [clonazepam]  07/14/2014  . Minocin [minocycline hcl]  07/14/2014  . Stelazine [trifluoperazine]  07/14/2014  . Viberzi [eluxadoline]  03/31/2015    Family History  Problem Relation Age of Onset  . Colon cancer Neg Hx     Social History   Social History  . Marital Status: Legally Separated    Spouse Name: N/A  . Number of Children: 2  . Years of Education: N/A   Occupational History  . disability    Social History Main Topics  . Smoking status: Current Some Day Smoker -- 0.50 packs/day    Types: Cigarettes  . Smokeless tobacco: Never Used     Comment: almost a pack a day  . Alcohol Use: No  . Drug Use: No  . Sexual Activity: Not Asked   Other Topics Concern  . None   Social History Narrative    Review of Systems: As mentioned in HPI    Physical Exam: BP 113/71 mmHg  Pulse 85  Temp(Src) 98.3 F (36.8 C) (Oral)  Ht 5\' 4"  (1.626 m)  Wt 155 lb 3.2 oz (70.398 kg)  BMI 26.63 kg/m2 General:   Alert and oriented. No distress noted. Pleasant and cooperative.  Head:  Normocephalic and atraumatic. Eyes:  Conjuctiva clear without scleral icterus. Heart:  S1, S2 present without murmurs, rubs, or gallops. Regular rate and rhythm. Abdomen:  +BS, soft, non-tender and non-distended. No rebound or guarding. No HSM or masses noted. Msk:  Symmetrical without gross deformities. Normal posture. Extremities:  Without edema. Neurologic:  Alert and  oriented x4;  grossly normal neurologically. Psych:  Alert and cooperative. Normal mood and affect.

## 2016-02-22 ENCOUNTER — Encounter: Payer: Self-pay | Admitting: Internal Medicine

## 2016-02-22 DIAGNOSIS — E785 Hyperlipidemia, unspecified: Secondary | ICD-10-CM | POA: Diagnosis not present

## 2016-02-22 DIAGNOSIS — J449 Chronic obstructive pulmonary disease, unspecified: Secondary | ICD-10-CM | POA: Diagnosis not present

## 2016-02-22 DIAGNOSIS — R69 Illness, unspecified: Secondary | ICD-10-CM | POA: Diagnosis not present

## 2016-02-22 DIAGNOSIS — Z0000 Encounter for general adult medical examination without abnormal findings: Secondary | ICD-10-CM | POA: Diagnosis not present

## 2016-02-22 DIAGNOSIS — K219 Gastro-esophageal reflux disease without esophagitis: Secondary | ICD-10-CM | POA: Diagnosis not present

## 2016-02-22 DIAGNOSIS — E039 Hypothyroidism, unspecified: Secondary | ICD-10-CM | POA: Diagnosis not present

## 2016-02-23 ENCOUNTER — Encounter: Payer: Self-pay | Admitting: Internal Medicine

## 2016-02-23 ENCOUNTER — Telehealth: Payer: Self-pay

## 2016-02-23 NOTE — Telephone Encounter (Signed)
Letter mailed to the pt. 

## 2016-02-23 NOTE — Telephone Encounter (Signed)
Per RMR- Send letter to patient.  Send copy of letter with path to referring provider and PCP. Needs RX for entocort - 9 mg daily x6 weeks ow ov w AS in 6 weeks to orchestrate taper. No refills until re-assessed

## 2016-02-23 NOTE — Telephone Encounter (Signed)
APPT MADE AND LETTER SENT  °

## 2016-02-24 ENCOUNTER — Encounter (HOSPITAL_COMMUNITY): Payer: Self-pay | Admitting: Internal Medicine

## 2016-02-25 DIAGNOSIS — H02032 Senile entropion of right lower eyelid: Secondary | ICD-10-CM | POA: Diagnosis not present

## 2016-02-29 DIAGNOSIS — E039 Hypothyroidism, unspecified: Secondary | ICD-10-CM | POA: Diagnosis not present

## 2016-02-29 DIAGNOSIS — E782 Mixed hyperlipidemia: Secondary | ICD-10-CM | POA: Diagnosis not present

## 2016-02-29 MED ORDER — BUDESONIDE 3 MG PO CPEP: 9.0000 mg | ORAL_CAPSULE | Freq: Every day | ORAL | 0 refills | Status: DC

## 2016-02-29 NOTE — Telephone Encounter (Signed)
Pt is aware. rx for entocort has been sent to the pharmacy. She is aware that she has to come in for an office visit and be tapered off the medication. Letter has been mailed to the pt about her ov.

## 2016-03-04 DIAGNOSIS — E441 Mild protein-calorie malnutrition: Secondary | ICD-10-CM | POA: Diagnosis not present

## 2016-03-04 DIAGNOSIS — Z0000 Encounter for general adult medical examination without abnormal findings: Secondary | ICD-10-CM | POA: Diagnosis not present

## 2016-03-04 DIAGNOSIS — J449 Chronic obstructive pulmonary disease, unspecified: Secondary | ICD-10-CM | POA: Diagnosis not present

## 2016-03-04 DIAGNOSIS — E782 Mixed hyperlipidemia: Secondary | ICD-10-CM | POA: Diagnosis not present

## 2016-03-04 DIAGNOSIS — K210 Gastro-esophageal reflux disease with esophagitis: Secondary | ICD-10-CM | POA: Diagnosis not present

## 2016-03-04 DIAGNOSIS — E039 Hypothyroidism, unspecified: Secondary | ICD-10-CM | POA: Diagnosis not present

## 2016-03-04 DIAGNOSIS — E785 Hyperlipidemia, unspecified: Secondary | ICD-10-CM | POA: Diagnosis not present

## 2016-03-04 DIAGNOSIS — I10 Essential (primary) hypertension: Secondary | ICD-10-CM | POA: Diagnosis not present

## 2016-03-24 DIAGNOSIS — H02032 Senile entropion of right lower eyelid: Secondary | ICD-10-CM | POA: Diagnosis not present

## 2016-04-13 ENCOUNTER — Encounter: Payer: Self-pay | Admitting: Gastroenterology

## 2016-04-13 ENCOUNTER — Ambulatory Visit (INDEPENDENT_AMBULATORY_CARE_PROVIDER_SITE_OTHER): Payer: Medicare HMO | Admitting: Gastroenterology

## 2016-04-13 DIAGNOSIS — K52832 Lymphocytic colitis: Secondary | ICD-10-CM

## 2016-04-13 MED ORDER — BUDESONIDE 3 MG PO CPEP: ORAL_CAPSULE | ORAL | 0 refills | Status: DC

## 2016-04-13 NOTE — Assessment & Plan Note (Signed)
Improved with entocort. Taper provided over next 2 weeks. Will have her return in 6 months. As of note, next colonoscopy in 3 years due to multiple polyps on recent colonoscopy.

## 2016-04-13 NOTE — Progress Notes (Signed)
Referring Provider: Celene Squibb, MD Primary Care Physician:  Wende Neighbors, MD  Primary GI: Dr. Gala Romney   Chief Complaint  Patient presents with  . Follow-up    HPI:   Jennifer [name] is a 70 y.o. 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 Here for routine follow-up. Will need surveillance colonoscopy in 3 years.   Still on entocort, has about a few days left. Diarrhea much improved. Lasagna flared up diarrhea. No rectal bleeding. No abdominal pain. Not taking Colestid but only took it when she had diarrhea with lasagna and only then took one tablet. Actually had some constipation one day.    Past Medical History:  Diagnosis Date  . Depression   . GERD (gastroesophageal reflux disease)   . History of colon polyps    remote past, unable to retrieve records   . Hyperlipidemia   . Hypothyroidism     Past Surgical History:  Procedure Laterality Date  . COLONOSCOPY     last one was about 3 yrs in [place]  . COLONOSCOPY N/A 02/19/2016   Dr. Gala Romney: three 3-6 mm polyps (tubular adenomas) in ascending colon, segmental biopsies consistent with lymphocytic colitis. Prescribed entocort.   . ESOPHAGOGASTRODUODENOSCOPY N/A 08/11/2014   RMR: MIld erosive  reflux esophagitis. small hiatal hernia. Abnormal gastric muocsa uncertain significance. Subtly abnormal duodeanl (bulbar) mucosa- status post multiple biopsies. BENIGN small bowel mucosa, no evidence of villous blulnting, mild reactive gastropathy on stomach biopsy   . GALLBLADDER SURGERY    . LEG SURGERY     right  . PARTIAL HYSTERECTOMY      Current Outpatient Prescriptions  Medication Sig Dispense Refill  . albuterol (PROVENTIL HFA;VENTOLIN HFA) 108 (90 BASE) MCG/ACT inhaler Inhale 1-2 puffs into the lungs every 6 (six) hours as needed for wheezing or shortness of breath.    Marland Kitchen  aspirin 325 MG tablet Take 325 mg by mouth at bedtime.     . budesonide (ENTOCORT EC) 3 MG 24 hr capsule Take 3 capsules (9 mg total) by mouth daily. For 6 weeks. 126 capsule 0  . citalopram (CELEXA) 20 MG tablet Take 20 mg by mouth daily.     . fluticasone (FLONASE) 50 MCG/ACT nasal spray Place 2 sprays into both nostrils 2 (two) times daily as needed for allergies.   1  . hydrocortisone (ANUSOL-HC) 2.5 % rectal cream Place 1 application rectally 2 (two) times daily. 30 g 1  . levothyroxine (SYNTHROID, LEVOTHROID) 88 MCG tablet Take 88 mcg by mouth daily before breakfast.     . omeprazole (PRILOSEC) 40 MG capsule Take 40 mg by mouth daily.     . simvastatin (ZOCOR) 20 MG tablet Take 20 mg by mouth daily at 6 PM.     . triamcinolone cream (KENALOG) 0.1 % Apply 1 application topically 2 (two) times daily as needed (rash).     . budesonide (ENTOCORT EC) 3 MG 24 hr capsule Take 2 capsules daily for 1 week then 1 capsule daily for 1 week then stop. 21 capsule 0   No current facility-administered medications for this visit.     Allergies as of 04/13/2016 - Review Complete 02/19/2016  Allergen Reaction Noted  . Desyrel [trazodone]  07/14/2014  . Keflex [cephalexin]  07/14/2014  . Klonopin [clonazepam]  07/14/2014  . Minocin [minocycline hcl]  07/14/2014  . Stelazine [trifluoperazine]  07/14/2014  . Viberzi [eluxadoline]  03/31/2015  Family History  Problem Relation Age of Onset  . Colon cancer Neg Hx     Social History   Social History  . Marital status: Legally Separated    Spouse name: N/A  . Number of children: 2  . Years of education: N/A   Occupational History  . disability    Social History Main Topics  . Smoking status: Current Some Day Smoker    Packs/day: 0.50    Types: Cigarettes  . Smokeless tobacco: Never Used     Comment: almost a pack a day  . Alcohol use No  . Drug use: No  . Sexual activity: Not Asked   Other Topics Concern  . None   Social History  Narrative  . None    Review of Systems: As mentioned in HPI   Physical Exam: BP 113/72   Pulse 90   Temp 97.8 F (36.6 C) (Oral)   Ht 5\' 3"  (1.6 m)   Wt 154 lb (69.9 kg)   BMI 27.28 kg/m  General:   Alert and oriented. No distress noted. Pleasant and cooperative.  Head:  Normocephalic and atraumatic. Eyes:  Conjuctiva clear without scleral icterus. Mouth:  Oral mucosa pink and moist. Good dentition. No lesions. Abdomen:  +BS, soft, non-tender and non-distended. No rebound or guarding. No HSM or masses noted. Msk:  Symmetrical without gross deformities. Normal posture. Extremities:  Without edema. Neurologic:  Alert and  oriented x4;  grossly normal neurologically. Psych:  Alert and cooperative. Normal mood and affect.

## 2016-04-13 NOTE — Patient Instructions (Signed)
I am glad you are doing so well!   For the entocort: continue the bottle you have, then pick up the next prescription. You will take 2 capsules daily for 1 week then 1 capsule daily for 1 week then stop.  We will see you in 6 months! Call me if you have any significant recurrent diarrhea!

## 2016-04-13 NOTE — Progress Notes (Signed)
CC'ED TO PCP 

## 2016-05-04 DIAGNOSIS — R609 Edema, unspecified: Secondary | ICD-10-CM | POA: Diagnosis not present

## 2016-05-04 DIAGNOSIS — H538 Other visual disturbances: Secondary | ICD-10-CM | POA: Diagnosis not present

## 2016-05-18 DIAGNOSIS — Z6826 Body mass index (BMI) 26.0-26.9, adult: Secondary | ICD-10-CM | POA: Diagnosis not present

## 2016-05-18 DIAGNOSIS — R197 Diarrhea, unspecified: Secondary | ICD-10-CM | POA: Diagnosis not present

## 2016-05-18 DIAGNOSIS — R600 Localized edema: Secondary | ICD-10-CM | POA: Diagnosis not present

## 2016-05-27 ENCOUNTER — Other Ambulatory Visit (HOSPITAL_COMMUNITY): Payer: Self-pay | Admitting: Internal Medicine

## 2016-05-27 DIAGNOSIS — Z1231 Encounter for screening mammogram for malignant neoplasm of breast: Secondary | ICD-10-CM

## 2016-06-03 ENCOUNTER — Ambulatory Visit (HOSPITAL_COMMUNITY)
Admission: RE | Admit: 2016-06-03 | Discharge: 2016-06-03 | Disposition: A | Discharge status: Home / Self Care | Payer: Medicare HMO | Source: Ambulatory Visit | Attending: Internal Medicine | Admitting: Internal Medicine

## 2016-06-03 DIAGNOSIS — Z1231 Encounter for screening mammogram for malignant neoplasm of breast: Secondary | ICD-10-CM | POA: Diagnosis not present

## 2016-07-05 DIAGNOSIS — Z6827 Body mass index (BMI) 27.0-27.9, adult: Secondary | ICD-10-CM | POA: Diagnosis not present

## 2016-07-05 DIAGNOSIS — J019 Acute sinusitis, unspecified: Secondary | ICD-10-CM | POA: Diagnosis not present

## 2016-07-05 DIAGNOSIS — J060 Acute laryngopharyngitis: Secondary | ICD-10-CM | POA: Diagnosis not present

## 2016-07-05 DIAGNOSIS — R05 Cough: Secondary | ICD-10-CM | POA: Diagnosis not present

## 2016-07-19 ENCOUNTER — Telehealth: Payer: Self-pay | Admitting: Internal Medicine

## 2016-07-19 DIAGNOSIS — R197 Diarrhea, unspecified: Secondary | ICD-10-CM

## 2016-07-19 NOTE — Telephone Encounter (Signed)
[phone]  PLEASE CALL PATIENT, SHE CAN NOT GET HER DIARRHEA TO STOP AND SHE IS AFRAID IT IS GOING TO MESS UP HER HOLIDAY

## 2016-07-20 ENCOUNTER — Other Ambulatory Visit: Payer: Self-pay

## 2016-07-20 DIAGNOSIS — R197 Diarrhea, unspecified: Secondary | ICD-10-CM

## 2016-07-20 NOTE — Telephone Encounter (Signed)
Pt is aware. Lab order done and container is at the front desk. She said if she can get by here, she will pick up a container at the lab.

## 2016-07-20 NOTE — Addendum Note (Signed)
Addended by: Claudina Lick on: 07/20/2016 02:58 PM   Modules accepted: Orders

## 2016-07-20 NOTE — Telephone Encounter (Signed)
Spoke with the pt, she said after she took entocort she was doing well, then she had some swelling in her feet and her pcp gave her a fluid pill and potassium and she started have diarrhea again, several times a day. She has been on an abx about a week ago for a sinus infection but she said the diarrhea started again when she started the fluid pills and potassium which was prior to the abx. Pt said she missed seeing her family at Thanksgiving d/t the diarrhea and she is afraid she will miss Christmas too.  Pt wants to know if she can take another round of entocort?

## 2016-07-20 NOTE — Telephone Encounter (Signed)
Check Cdiff PCR now, and if it's negative, I will give her another round.

## 2016-08-12 DIAGNOSIS — Z6827 Body mass index (BMI) 27.0-27.9, adult: Secondary | ICD-10-CM | POA: Diagnosis not present

## 2016-08-12 DIAGNOSIS — J449 Chronic obstructive pulmonary disease, unspecified: Secondary | ICD-10-CM | POA: Diagnosis not present

## 2016-09-02 DIAGNOSIS — E039 Hypothyroidism, unspecified: Secondary | ICD-10-CM | POA: Diagnosis not present

## 2016-09-02 DIAGNOSIS — E782 Mixed hyperlipidemia: Secondary | ICD-10-CM | POA: Diagnosis not present

## 2016-09-06 DIAGNOSIS — E46 Unspecified protein-calorie malnutrition: Secondary | ICD-10-CM | POA: Diagnosis not present

## 2016-09-06 DIAGNOSIS — R600 Localized edema: Secondary | ICD-10-CM | POA: Diagnosis not present

## 2016-09-06 DIAGNOSIS — E782 Mixed hyperlipidemia: Secondary | ICD-10-CM | POA: Diagnosis not present

## 2016-09-06 DIAGNOSIS — Z6827 Body mass index (BMI) 27.0-27.9, adult: Secondary | ICD-10-CM | POA: Diagnosis not present

## 2016-09-06 DIAGNOSIS — K219 Gastro-esophageal reflux disease without esophagitis: Secondary | ICD-10-CM | POA: Diagnosis not present

## 2016-09-06 DIAGNOSIS — R0602 Shortness of breath: Secondary | ICD-10-CM | POA: Diagnosis not present

## 2016-09-06 DIAGNOSIS — J449 Chronic obstructive pulmonary disease, unspecified: Secondary | ICD-10-CM | POA: Diagnosis not present

## 2016-09-06 DIAGNOSIS — Z0000 Encounter for general adult medical examination without abnormal findings: Secondary | ICD-10-CM | POA: Diagnosis not present

## 2016-09-06 DIAGNOSIS — E039 Hypothyroidism, unspecified: Secondary | ICD-10-CM | POA: Diagnosis not present

## 2016-09-06 DIAGNOSIS — R69 Illness, unspecified: Secondary | ICD-10-CM | POA: Diagnosis not present

## 2016-09-07 ENCOUNTER — Other Ambulatory Visit (HOSPITAL_COMMUNITY): Payer: Self-pay | Admitting: Internal Medicine

## 2016-09-07 DIAGNOSIS — I509 Heart failure, unspecified: Secondary | ICD-10-CM

## 2016-09-12 ENCOUNTER — Ambulatory Visit (HOSPITAL_COMMUNITY)
Admission: RE | Admit: 2016-09-12 | Discharge: 2016-09-12 | Disposition: A | Discharge status: Home / Self Care | Payer: Medicare HMO | Source: Ambulatory Visit | Attending: Internal Medicine | Admitting: Internal Medicine

## 2016-09-12 DIAGNOSIS — K219 Gastro-esophageal reflux disease without esophagitis: Secondary | ICD-10-CM | POA: Diagnosis not present

## 2016-09-12 DIAGNOSIS — Z8679 Personal history of other diseases of the circulatory system: Secondary | ICD-10-CM | POA: Insufficient documentation

## 2016-09-12 DIAGNOSIS — I071 Rheumatic tricuspid insufficiency: Secondary | ICD-10-CM | POA: Diagnosis not present

## 2016-09-12 DIAGNOSIS — I509 Heart failure, unspecified: Secondary | ICD-10-CM

## 2016-09-12 DIAGNOSIS — R0602 Shortness of breath: Secondary | ICD-10-CM | POA: Insufficient documentation

## 2016-09-12 DIAGNOSIS — R600 Localized edema: Secondary | ICD-10-CM | POA: Insufficient documentation

## 2016-09-12 LAB — ECHOCARDIOGRAM COMPLETE
AO mean calculated velocity dopler: 74.8 cm/s
AOPV: 0.69 m/s
AV Area VTI index: 1.09 cm2/m2
AV Mean grad: 3 mmHg
AV peak Index: 0.98
AV pk vel: 122 cm/s
AVAREAMEANV: 1.78 cm2
AVAREAMEANVIN: 1 cm2/m2
AVAREAVTI: 1.75 cm2
AVCELMEANRAT: 0.7
AVPG: 6 mmHg
CHL CUP AV VALUE AREA INDEX: 1.09
CHL CUP AV VEL: 1.94
CHL CUP MV DEC (S): 282
CHL CUP RV SYS PRESS: 16 mmHg
CHL CUP STROKE VOLUME: 26 mL
CHL CUP TV REG PEAK VELOCITY: 178 cm/s
E decel time: 282 msec
E/e' ratio: 7.85
FS: 41 % (ref 28–44)
IV/PV OW: 1.01
LA ID, A-P, ES: 25 mm
LA vol A4C: 17.2 ml
LADIAMINDEX: 1.4 cm/m2
LAVOL: 18.3 mL
LAVOLIN: 10.3 mL/m2
LDCA: 2.54 cm2
LEFT ATRIUM END SYS DIAM: 25 mm
LV E/e' medial: 7.85
LV E/e'average: 7.85
LV SIMPSON'S DISK: 60
LV TDI E'LATERAL: 6.85
LV dias vol index: 24 mL/m2
LV dias vol: 43 mL — AB (ref 46–106)
LV e' LATERAL: 6.85 cm/s
LV sys vol: 17 mL (ref 14–42)
LVOT SV: 45 mL
LVOT VTI: 17.8 cm
LVOT peak VTI: 0.76 cm
LVOT peak grad rest: 3 mmHg
LVOTD: 18 mm
LVOTPV: 84.1 cm/s
LVSYSVOLIN: 10 mL/m2
Lateral S' vel: 9.79 cm/s
MVPKAVEL: 81.9 m/s
MVPKEVEL: 53.8 m/s
PW: 9.52 mm — AB (ref 0.6–1.1)
TAPSE: 18.4 mm
TDI e' medial: 6.85
TRMAXVEL: 178 cm/s
VTI: 23.3 cm
Valve area: 1.94 cm2

## 2016-09-12 NOTE — Progress Notes (Signed)
*  PRELIMINARY RESULTS* Echocardiogram 2D Echocardiogram has been performed.  Samuel Germany 09/12/2016, 12:18 PM

## 2016-09-28 DIAGNOSIS — E785 Hyperlipidemia, unspecified: Secondary | ICD-10-CM | POA: Diagnosis not present

## 2016-09-28 DIAGNOSIS — K219 Gastro-esophageal reflux disease without esophagitis: Secondary | ICD-10-CM | POA: Diagnosis not present

## 2016-09-28 DIAGNOSIS — E039 Hypothyroidism, unspecified: Secondary | ICD-10-CM | POA: Diagnosis not present

## 2016-09-28 DIAGNOSIS — Z972 Presence of dental prosthetic device (complete) (partial): Secondary | ICD-10-CM | POA: Diagnosis not present

## 2016-09-28 DIAGNOSIS — Z0000 Encounter for general adult medical examination without abnormal findings: Secondary | ICD-10-CM | POA: Diagnosis not present

## 2016-09-28 DIAGNOSIS — R197 Diarrhea, unspecified: Secondary | ICD-10-CM | POA: Diagnosis not present

## 2016-09-28 DIAGNOSIS — J449 Chronic obstructive pulmonary disease, unspecified: Secondary | ICD-10-CM | POA: Diagnosis not present

## 2016-09-28 DIAGNOSIS — J309 Allergic rhinitis, unspecified: Secondary | ICD-10-CM | POA: Diagnosis not present

## 2016-09-28 DIAGNOSIS — E876 Hypokalemia: Secondary | ICD-10-CM | POA: Diagnosis not present

## 2016-09-28 DIAGNOSIS — R69 Illness, unspecified: Secondary | ICD-10-CM | POA: Diagnosis not present

## 2016-09-28 DIAGNOSIS — Z7951 Long term (current) use of inhaled steroids: Secondary | ICD-10-CM | POA: Diagnosis not present

## 2016-10-04 DIAGNOSIS — R0602 Shortness of breath: Secondary | ICD-10-CM | POA: Diagnosis not present

## 2016-10-04 DIAGNOSIS — R600 Localized edema: Secondary | ICD-10-CM | POA: Diagnosis not present

## 2016-10-11 ENCOUNTER — Ambulatory Visit: Payer: Medicare HMO | Admitting: Gastroenterology

## 2016-10-24 DIAGNOSIS — G459 Transient cerebral ischemic attack, unspecified: Secondary | ICD-10-CM | POA: Diagnosis not present

## 2016-10-24 DIAGNOSIS — E785 Hyperlipidemia, unspecified: Secondary | ICD-10-CM | POA: Diagnosis not present

## 2016-10-24 DIAGNOSIS — E119 Type 2 diabetes mellitus without complications: Secondary | ICD-10-CM | POA: Diagnosis not present

## 2016-10-24 DIAGNOSIS — I10 Essential (primary) hypertension: Secondary | ICD-10-CM | POA: Diagnosis not present

## 2016-10-31 DIAGNOSIS — Z6827 Body mass index (BMI) 27.0-27.9, adult: Secondary | ICD-10-CM | POA: Diagnosis not present

## 2016-10-31 DIAGNOSIS — N939 Abnormal uterine and vaginal bleeding, unspecified: Secondary | ICD-10-CM | POA: Diagnosis not present

## 2016-11-04 ENCOUNTER — Telehealth: Payer: Self-pay | Admitting: General Practice

## 2016-11-04 ENCOUNTER — Ambulatory Visit (INDEPENDENT_AMBULATORY_CARE_PROVIDER_SITE_OTHER): Payer: Medicare HMO | Admitting: Gastroenterology

## 2016-11-04 VITALS — BP 103/63 | HR 76 | Temp 97.9°F | Ht 64.0 in | Wt 155.2 lb

## 2016-11-04 DIAGNOSIS — K529 Noninfective gastroenteritis and colitis, unspecified: Secondary | ICD-10-CM | POA: Diagnosis not present

## 2016-11-04 NOTE — Patient Instructions (Signed)
I recommend stopping the 325 mg aspirin and smoking. Avoid Ibuprofen, Advil, Motrin, Aleve, etc.   Please complete the urine collection and stool. If the stool is negative, I will repeat the round of entocort.   I will see you in 2-3 months!  Call me if you need anything!

## 2016-11-04 NOTE — Assessment & Plan Note (Signed)
Lymphocytic colitis on biopsies in July 2017, improving with course of entocort. Now with recurrent diarrhea and exposure to antibiotics several months ago. Check Cdiff now. Will also check urinary 5-HIAA although I highly doubt this will be positive; however, she does have a chronic history of diarrhea and has had rather an extensive evaluation thus far. She notes facial flushing at times but attributes to housework. 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 However, will be thorough and check this to have on file. If stool studies are negative, will give another round of entocort. I have asked her to stop the 325 mg ASA (not prescribed, she was taking this on her own), and to STOP smoking due to known history of microscopic colitis. Return in 2-3 months.

## 2016-11-04 NOTE — Telephone Encounter (Signed)
I called the provider line for [facility] and was told this patient doesn't require an authorization for speciality visits.

## 2016-11-04 NOTE — Progress Notes (Signed)
Referring Provider: Celene Squibb, MD Primary Care Physician:  Wende Neighbors, MD  Primary GI: Dr. Gala Romney   Chief Complaint  Patient presents with  . Diarrhea    HPI:   Jennifer [name] is a 71 y.o. 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 Did well with entocort. Last seen in Sept 2017. Here for 6 month follow-up.   Sometimes diarrhea is really bad, sometimes constipated. Didn't take any medication and ended up being constipated. Predominantly with diarrhea. Continues to smoke. No NSAIDs. Weight is stable. Missed Thanksgiving because she had diarrhea so bad. Flared at Christmas, too. Took 8 Colestid to get things under control. Diarrhea hit her as soon as she got home. If has a good week, diarrhea will come back the next week. Occasional lower abdominal pain. Sometimes facial flushing noted, but she states it is related to exertion.  Taking ASA 325 daily. Exposure to antibiotics around Thanksgiving/Christmas, but did not complete Cdiff stool studies as requested.     Past Medical History:  Diagnosis Date  . Depression   . GERD (gastroesophageal reflux disease)   . History of colon polyps    remote past, unable to retrieve records   . Hyperlipidemia   . Hypothyroidism     Past Surgical History:  Procedure Laterality Date  . COLONOSCOPY     last one was about 3 yrs in [place]  . COLONOSCOPY N/A 02/19/2016   Dr. Gala Romney: three 3-6 mm polyps (tubular adenomas) in ascending colon, segmental biopsies consistent with lymphocytic colitis. Prescribed entocort.   . ESOPHAGOGASTRODUODENOSCOPY N/A 08/11/2014   RMR: MIld erosive  reflux esophagitis. small hiatal hernia. Abnormal gastric muocsa uncertain significance. Subtly abnormal duodeanl (bulbar) mucosa- status post multiple biopsies. BENIGN small bowel mucosa, no evidence of villous blulnting, mild reactive  gastropathy on stomach biopsy   . GALLBLADDER SURGERY    . LEG SURGERY     right  . PARTIAL HYSTERECTOMY      Current Outpatient Prescriptions  Medication Sig Dispense Refill  . albuterol (PROVENTIL HFA;VENTOLIN HFA) 108 (90 BASE) MCG/ACT inhaler Inhale 1-2 puffs into the lungs every 6 (six) hours as needed for wheezing or shortness of breath.    Marland Kitchen aspirin 325 MG tablet Take 325 mg by mouth at bedtime.     . budesonide (ENTOCORT EC) 3 MG 24 hr capsule Take 3 capsules (9 mg total) by mouth daily. For 6 weeks. 126 capsule 0  . citalopram (CELEXA) 20 MG tablet Take 20 mg by mouth daily.     . fluticasone (FLONASE) 50 MCG/ACT nasal spray Place 2 sprays into both nostrils 2 (two) times daily as needed for allergies.   1  . levothyroxine (SYNTHROID, LEVOTHROID) 88 MCG tablet Take 88 mcg by mouth daily before breakfast.     . omeprazole (PRILOSEC) 40 MG capsule Take 40 mg by mouth daily.     . simvastatin (ZOCOR) 20 MG tablet Take 20 mg by mouth daily at 6 PM.     . torsemide (DEMADEX) 20 MG tablet Take 20 mg by mouth.     . triamcinolone cream (KENALOG) 0.1 % Apply 1 application topically 2 (two) times daily as needed (rash).     . budesonide (ENTOCORT EC) 3 MG 24 hr capsule Take 2 capsules daily for 1 week then 1 capsule daily for 1 week then stop. (Patient not taking: Reported on 11/04/2016) 21 capsule 0  .  hydrocortisone (ANUSOL-HC) 2.5 % rectal cream Place 1 application rectally 2 (two) times daily. (Patient not taking: Reported on 11/04/2016) 30 g 1   No current facility-administered medications for this visit.     Allergies as of 11/04/2016 - Review Complete 11/04/2016  Allergen Reaction Noted  . Desyrel [trazodone]  07/14/2014  . Keflex [cephalexin]  07/14/2014  . Klonopin [clonazepam]  07/14/2014  . Minocin [minocycline hcl]  07/14/2014  . Stelazine [trifluoperazine]  07/14/2014  . Viberzi [eluxadoline]  03/31/2015    Family History  Problem Relation Age of Onset  . Colon cancer  Neg Hx     Social History   Social History  . Marital status: Legally Separated    Spouse name: N/A  . Number of children: 2  . Years of education: N/A   Occupational History  . disability    Social History Main Topics  . Smoking status: Current Some Day Smoker    Packs/day: 0.50    Types: Cigarettes  . Smokeless tobacco: Never Used     Comment: almost a pack a day  . Alcohol use No  . Drug use: No  . Sexual activity: Not on file   Other Topics Concern  . Not on file   Social History Narrative  . No narrative on file    Review of Systems: As mentioned in HPI   Physical Exam: BP 103/63   Pulse 76   Temp 97.9 F (36.6 C) (Oral)   Ht 5\' 4"  (1.626 m)   Wt 155 lb 3.2 oz (70.4 kg)   BMI 26.64 kg/m  General:   Alert and oriented. No distress noted. Pleasant and cooperative.  Head:  Normocephalic and atraumatic. Eyes:  Conjuctiva clear without scleral icterus. Mouth:  Oral mucosa pink and moist.  Abdomen:  +BS, soft, non-tender and non-distended. No rebound or guarding. No HSM or masses noted. Msk:  Symmetrical without gross deformities. Normal posture. Extremities:  Without edema. Neurologic:  Alert and  oriented x4;  grossly normal neurologically. Psych:  Alert and cooperative. Normal mood and affect.

## 2016-11-07 NOTE — Progress Notes (Signed)
CC'D TO PCP °

## 2016-11-16 DIAGNOSIS — H524 Presbyopia: Secondary | ICD-10-CM | POA: Diagnosis not present

## 2016-11-16 DIAGNOSIS — Z0100 Encounter for examination of eyes and vision without abnormal findings: Secondary | ICD-10-CM | POA: Diagnosis not present

## 2016-12-05 ENCOUNTER — Encounter: Payer: Self-pay | Admitting: Gastroenterology

## 2016-12-09 DIAGNOSIS — Z720 Tobacco use: Secondary | ICD-10-CM | POA: Diagnosis not present

## 2016-12-09 DIAGNOSIS — R609 Edema, unspecified: Secondary | ICD-10-CM | POA: Diagnosis not present

## 2016-12-09 DIAGNOSIS — R0602 Shortness of breath: Secondary | ICD-10-CM | POA: Diagnosis not present

## 2016-12-09 DIAGNOSIS — J449 Chronic obstructive pulmonary disease, unspecified: Secondary | ICD-10-CM | POA: Diagnosis not present

## 2016-12-09 DIAGNOSIS — Z6827 Body mass index (BMI) 27.0-27.9, adult: Secondary | ICD-10-CM | POA: Diagnosis not present

## 2016-12-15 DIAGNOSIS — R609 Edema, unspecified: Secondary | ICD-10-CM | POA: Diagnosis not present

## 2016-12-15 DIAGNOSIS — R69 Illness, unspecified: Secondary | ICD-10-CM | POA: Diagnosis not present

## 2016-12-15 DIAGNOSIS — M25569 Pain in unspecified knee: Secondary | ICD-10-CM | POA: Diagnosis not present

## 2016-12-15 DIAGNOSIS — J449 Chronic obstructive pulmonary disease, unspecified: Secondary | ICD-10-CM | POA: Diagnosis not present

## 2016-12-15 DIAGNOSIS — R0602 Shortness of breath: Secondary | ICD-10-CM | POA: Diagnosis not present

## 2016-12-15 DIAGNOSIS — R600 Localized edema: Secondary | ICD-10-CM | POA: Diagnosis not present

## 2016-12-21 DIAGNOSIS — K529 Noninfective gastroenteritis and colitis, unspecified: Secondary | ICD-10-CM | POA: Diagnosis not present

## 2016-12-22 LAB — C. DIFFICILE GDH AND TOXIN A/B
C. DIFF TOXIN A/B: NOT DETECTED
C. difficile GDH: NOT DETECTED

## 2016-12-23 LAB — CLOSTRIDIUM DIFFICILE BY PCR: CDIFFPCR: NOT DETECTED

## 2016-12-30 ENCOUNTER — Telehealth: Payer: Self-pay | Admitting: Gastroenterology

## 2016-12-30 MED ORDER — BUDESONIDE 3 MG PO CPEP: 9.0000 mg | ORAL_CAPSULE | Freq: Every day | ORAL | 3 refills | Status: DC

## 2016-12-30 NOTE — Progress Notes (Signed)
Cdiff negative. Urinary results from 5-HIAA still pending. Will treat with another round of entocort X 6 weeks. Stacey: Please have her come and see me in the next 4-6 weeks. Will need taper.

## 2016-12-30 NOTE — Progress Notes (Signed)
We can keep that appt.

## 2016-12-30 NOTE — Telephone Encounter (Signed)
Entocort sent in to pharmacy.

## 2016-12-31 LAB — 5 HIAA, QUANTITATIVE, URINE, 24 HOUR: 5-HIAA, 24 Hr Urine: 0.9 mg/24 h (ref <=6.0)

## 2017-01-18 ENCOUNTER — Ambulatory Visit: Payer: Medicare HMO | Admitting: Gastroenterology

## 2017-01-19 DIAGNOSIS — R252 Cramp and spasm: Secondary | ICD-10-CM | POA: Diagnosis not present

## 2017-01-23 ENCOUNTER — Encounter: Payer: Self-pay | Admitting: Gastroenterology

## 2017-01-23 ENCOUNTER — Ambulatory Visit (INDEPENDENT_AMBULATORY_CARE_PROVIDER_SITE_OTHER): Payer: Medicare HMO | Admitting: Gastroenterology

## 2017-01-23 VITALS — BP 119/75 | HR 76 | Temp 97.4°F | Ht 65.0 in | Wt 153.6 lb

## 2017-01-23 DIAGNOSIS — K52832 Lymphocytic colitis: Secondary | ICD-10-CM

## 2017-01-23 MED ORDER — BUDESONIDE 3 MG PO CPEP: ORAL_CAPSULE | ORAL | 0 refills | Status: DC

## 2017-01-23 NOTE — Progress Notes (Signed)
Referring Provider: Celene Squibb, MD Primary Care Physician:  Celene Squibb, MD Primary GI: Dr. Gala Romney   Chief Complaint  Patient presents with  . Diarrhea    HPI:   Jennifer [name] is a 71 y.o. female presenting today with a history of chronic diarrhea, colonoscopy in July 2017 with multiple tubular adenomas and segmental biopsies revealing lymphocytic colitis. She did well with entocort at that time. Returned in April 2018 with recurrent predominant diarrhea. Stool studies negative. Was also taking 325 mg aspirin, which she was asked to stop. She was restarted on another round of entocort June 1st for 6 weeks. Needs taper in near future.  Had a bout with cramping awhile ago that went from her feet to her back. Finally calmed down. She states they believe her potassium dropped. Dr. [place] Crane will be checking her potassium this Friday. States she had pedal edema during this time. Followed by PCP. Has a BM about twice per day, formed. Symptoms improved on second dose of entocort. No rectal bleeding.   Past Medical History:  Diagnosis Date  . Depression   . GERD (gastroesophageal reflux disease)   . History of colon polyps    remote past, unable to retrieve records   . Hyperlipidemia   . Hypothyroidism     Past Surgical History:  Procedure Laterality Date  . COLONOSCOPY     last one was about 3 yrs in [place]  . COLONOSCOPY N/A 02/19/2016   Dr. Gala Romney: three 3-6 mm polyps (tubular adenomas) in ascending colon, segmental biopsies consistent with lymphocytic colitis. Prescribed entocort.   . ESOPHAGOGASTRODUODENOSCOPY N/A 08/11/2014   RMR: MIld erosive  reflux esophagitis. small hiatal hernia. Abnormal gastric muocsa uncertain significance. Subtly abnormal duodeanl (bulbar) mucosa- status post multiple biopsies. BENIGN small bowel mucosa, no evidence of villous blulnting, mild reactive gastropathy on stomach biopsy   . GALLBLADDER SURGERY    . LEG SURGERY     right  . PARTIAL  HYSTERECTOMY      Current Outpatient Prescriptions  Medication Sig Dispense Refill  . albuterol (PROVENTIL HFA;VENTOLIN HFA) 108 (90 BASE) MCG/ACT inhaler Inhale 1-2 puffs into the lungs every 6 (six) hours as needed for wheezing or shortness of breath.    . budesonide (ENTOCORT EC) 3 MG 24 hr capsule Take 3 capsules (9 mg total) by mouth daily. 90 capsule 3  . citalopram (CELEXA) 20 MG tablet Take 20 mg by mouth daily.     . fluticasone (FLONASE) 50 MCG/ACT nasal spray Place 2 sprays into both nostrils 2 (two) times daily as needed for allergies.   1  . levothyroxine (SYNTHROID, LEVOTHROID) 88 MCG tablet Take 88 mcg by mouth daily before breakfast.     . omeprazole (PRILOSEC) 40 MG capsule Take 40 mg by mouth daily.     . simvastatin (ZOCOR) 20 MG tablet Take 20 mg by mouth daily at 6 PM.     . torsemide (DEMADEX) 20 MG tablet Take 20 mg by mouth.     . triamcinolone cream (KENALOG) 0.1 % Apply 1 application topically 2 (two) times daily as needed (rash).     . budesonide (ENTOCORT EC) 3 MG 24 hr capsule For taper: take 2 capsules daily for 2 weeks then 1 capsule daily for 2 weeks then stop. 42 capsule 0   No current facility-administered medications for this visit.     Allergies as of 01/23/2017 - Review Complete 01/23/2017  Allergen Reaction Noted  . Desyrel [trazodone]  07/14/2014  .  Keflex [cephalexin]  07/14/2014  . Klonopin [clonazepam]  07/14/2014  . Minocin [minocycline hcl]  07/14/2014  . Stelazine [trifluoperazine]  07/14/2014  . Viberzi [eluxadoline]  03/31/2015    Family History  Problem Relation Age of Onset  . Colon cancer Neg Hx     Social History   Social History  . Marital status: Legally Separated    Spouse name: N/A  . Number of children: 2  . Years of education: N/A   Occupational History  . disability    Social History Main Topics  . Smoking status: Current Some Day Smoker    Packs/day: 0.50    Types: Cigarettes  . Smokeless tobacco: Never Used       Comment: almost a pack a day  . Alcohol use No  . Drug use: No  . Sexual activity: Not Asked   Other Topics Concern  . None   Social History Narrative  . None    Review of Systems: As mentioned in HPI    Physical Exam: BP 119/75   Pulse 76   Temp 97.4 F (36.3 C) (Oral)   Ht 5\' 5"  (1.651 m)   Wt 153 lb 9.6 oz (69.7 kg)   BMI 25.56 kg/m  General:   Alert and oriented. No distress noted. Pleasant and cooperative.  Head:  Normocephalic and atraumatic. Eyes:  Conjuctiva clear without scleral icterus. Mouth:  Oral mucosa pink and moist. Good dentition. No lesions. Abdomen:  +BS, soft, non-tender and non-distended. No rebound or guarding. No HSM or masses noted. Msk:  Symmetrical without gross deformities. Normal posture. Extremities:  Without edema. Neurologic:  Alert and  oriented x4;  grossly normal neurologically. Psych:  Alert and cooperative. Normal mood and affect.

## 2017-01-23 NOTE — Patient Instructions (Signed)
Complete the bottle of entocort that you have.   Then, fill the next prescription. You will take 2 capsules a day for 2 weeks then 1 capsule a day for 2 weeks then stop.  I will see you in 3 months. Let me know if diarrhea recurs during the taper time!

## 2017-01-24 NOTE — Assessment & Plan Note (Signed)
Doing well on additional round of entocort; will provide taper now over next 4 weeks. Patient to call if recurrence of symptoms. Return in 3 months. Next colonoscopy in 2020.

## 2017-01-25 NOTE — Progress Notes (Signed)
CC'D TO PCP °

## 2017-01-30 ENCOUNTER — Telehealth: Payer: Self-pay | Admitting: Internal Medicine

## 2017-01-30 DIAGNOSIS — E039 Hypothyroidism, unspecified: Secondary | ICD-10-CM | POA: Diagnosis not present

## 2017-01-30 DIAGNOSIS — J449 Chronic obstructive pulmonary disease, unspecified: Secondary | ICD-10-CM | POA: Diagnosis not present

## 2017-01-30 DIAGNOSIS — R7301 Impaired fasting glucose: Secondary | ICD-10-CM | POA: Diagnosis not present

## 2017-01-30 DIAGNOSIS — R600 Localized edema: Secondary | ICD-10-CM | POA: Diagnosis not present

## 2017-01-30 DIAGNOSIS — M62838 Other muscle spasm: Secondary | ICD-10-CM | POA: Diagnosis not present

## 2017-01-30 DIAGNOSIS — E782 Mixed hyperlipidemia: Secondary | ICD-10-CM | POA: Diagnosis not present

## 2017-01-30 MED ORDER — BUDESONIDE 3 MG PO CPEP: ORAL_CAPSULE | ORAL | 0 refills | Status: DC

## 2017-01-30 NOTE — Telephone Encounter (Signed)
I have cancelled the first entocort and I have sent in a new rx to [facility] per rx that AB sent in.

## 2017-01-30 NOTE — Telephone Encounter (Signed)
Thank you :)

## 2017-01-30 NOTE — Telephone Encounter (Signed)
Pt called to say that she had seen AB on 6/25 and her prescription of Entocort needed to be called in [place] and not [employer]

## 2017-02-02 DIAGNOSIS — E039 Hypothyroidism, unspecified: Secondary | ICD-10-CM | POA: Diagnosis not present

## 2017-02-02 DIAGNOSIS — R7301 Impaired fasting glucose: Secondary | ICD-10-CM | POA: Diagnosis not present

## 2017-02-06 DIAGNOSIS — J449 Chronic obstructive pulmonary disease, unspecified: Secondary | ICD-10-CM | POA: Diagnosis not present

## 2017-02-06 DIAGNOSIS — M62838 Other muscle spasm: Secondary | ICD-10-CM | POA: Diagnosis not present

## 2017-02-06 DIAGNOSIS — R609 Edema, unspecified: Secondary | ICD-10-CM | POA: Diagnosis not present

## 2017-02-06 DIAGNOSIS — E039 Hypothyroidism, unspecified: Secondary | ICD-10-CM | POA: Diagnosis not present

## 2017-02-14 ENCOUNTER — Telehealth: Payer: Self-pay | Admitting: Internal Medicine

## 2017-02-14 MED ORDER — NYSTATIN 100000 UNIT/ML MT SUSP: OROMUCOSAL | 0 refills | Status: DC

## 2017-02-14 NOTE — Telephone Encounter (Signed)
Pt called to say that she thinks the medicine she is taking has given her thrust and she wants to speak to nurse. Please call [phone]

## 2017-02-14 NOTE — Telephone Encounter (Signed)
Sounds like she has developed oral candidiasis. I would recommend trying to complete the entocort unless she sees tongue or mouth swelling.   Let's try nystatin for the oral candidiasis (thrush). rx sent.

## 2017-02-14 NOTE — Telephone Encounter (Signed)
LMOM to call.

## 2017-02-14 NOTE — Telephone Encounter (Signed)
PT is aware.

## 2017-02-14 NOTE — Addendum Note (Signed)
Addended by: Mahala Menghini on: 02/14/2017 01:30 PM   Modules accepted: Orders

## 2017-02-14 NOTE — Telephone Encounter (Signed)
Pt said her throat was a little sore over the week-end with white bumps on the side. Then yesterday her tongue was white, a little swollen and had cracks. She was wondering if it could be coming from the Entocort. Please advise! Forwarding to Neil Crouch, PA in Anna's absence from the office.

## 2017-03-03 DIAGNOSIS — M79672 Pain in left foot: Secondary | ICD-10-CM | POA: Diagnosis not present

## 2017-03-03 DIAGNOSIS — M216X2 Other acquired deformities of left foot: Secondary | ICD-10-CM | POA: Diagnosis not present

## 2017-03-03 DIAGNOSIS — M2042 Other hammer toe(s) (acquired), left foot: Secondary | ICD-10-CM | POA: Diagnosis not present

## 2017-03-03 DIAGNOSIS — M2012 Hallux valgus (acquired), left foot: Secondary | ICD-10-CM | POA: Diagnosis not present

## 2017-03-03 DIAGNOSIS — L851 Acquired keratosis [keratoderma] palmaris et plantaris: Secondary | ICD-10-CM | POA: Diagnosis not present

## 2017-04-06 DIAGNOSIS — J0100 Acute maxillary sinusitis, unspecified: Secondary | ICD-10-CM | POA: Diagnosis not present

## 2017-04-06 DIAGNOSIS — B379 Candidiasis, unspecified: Secondary | ICD-10-CM | POA: Diagnosis not present

## 2017-04-13 DIAGNOSIS — J0100 Acute maxillary sinusitis, unspecified: Secondary | ICD-10-CM | POA: Diagnosis not present

## 2017-04-13 DIAGNOSIS — B379 Candidiasis, unspecified: Secondary | ICD-10-CM | POA: Diagnosis not present

## 2017-04-24 ENCOUNTER — Other Ambulatory Visit (HOSPITAL_COMMUNITY): Payer: Self-pay | Admitting: Internal Medicine

## 2017-04-24 DIAGNOSIS — Z1231 Encounter for screening mammogram for malignant neoplasm of breast: Secondary | ICD-10-CM

## 2017-04-25 ENCOUNTER — Ambulatory Visit (INDEPENDENT_AMBULATORY_CARE_PROVIDER_SITE_OTHER): Payer: Medicare HMO | Admitting: Gastroenterology

## 2017-04-25 ENCOUNTER — Encounter: Payer: Self-pay | Admitting: Gastroenterology

## 2017-04-25 ENCOUNTER — Encounter: Payer: Self-pay | Admitting: Internal Medicine

## 2017-04-25 VITALS — BP 116/76 | HR 79 | Temp 97.0°F | Ht 64.0 in | Wt 160.6 lb

## 2017-04-25 DIAGNOSIS — K52832 Lymphocytic colitis: Secondary | ICD-10-CM | POA: Diagnosis not present

## 2017-04-25 MED ORDER — HYOSCYAMINE SULFATE 0.125 MG SL SUBL: 0.1250 mg | SUBLINGUAL_TABLET | SUBLINGUAL | 0 refills | Status: DC | PRN

## 2017-04-25 NOTE — Progress Notes (Signed)
Referring Provider: Celene Squibb, MD Primary Care Physician:  Celene Squibb, MD Primary GI: Dr. Gala Romney   Chief Complaint  Patient presents with  . Diarrhea    f/u, has been better past few weeks    HPI:   Jennifer [name] is a 71 y.o. female presenting today with a history of chronic diarrhea, colonoscopy in July 2017 with multiple tubular adenomas and segmental biopsies revealing lymphocytic colitis. She did well with entocort at that time. Returned in April 2018 with recurrent predominant diarrhea. Stool studies negative. Was also taking 325 mg aspirin, which she was asked to stop. She was restarted on another round of entocort June 1st for 6 weeks.   Here for follow-up, last seen June 2018. Had been on antibiotics and diarrhea flared up. Hemorrhoids were hanging out. Much improved now. Used rectal cream with relief that she had on hand. No further rectal discomfort.   Bowel habits depend on what you eat. Can eat pizza one day and then "kill me next week". Stool is still watery but much improved with frequency. A good day will be three times a day. A "really good day" is none. Bentyl without improvement historically. Has muscle cramps intermittently. Not feeling hungry but gaining weight. Eats once a day.   Past Medical History:  Diagnosis Date  . Depression   . GERD (gastroesophageal reflux disease)   . History of colon polyps    remote past, unable to retrieve records   . Hyperlipidemia   . Hypothyroidism     Past Surgical History:  Procedure Laterality Date  . COLONOSCOPY     last one was about 3 yrs in [place]  . COLONOSCOPY N/A 02/19/2016   Dr. Gala Romney: three 3-6 mm polyps (tubular adenomas) in ascending colon, segmental biopsies consistent with lymphocytic colitis. Prescribed entocort.   . ESOPHAGOGASTRODUODENOSCOPY N/A 08/11/2014   RMR: MIld erosive  reflux esophagitis. small hiatal hernia. Abnormal gastric muocsa uncertain significance. Subtly abnormal duodeanl (bulbar)  mucosa- status post multiple biopsies. BENIGN small bowel mucosa, no evidence of villous blulnting, mild reactive gastropathy on stomach biopsy   . GALLBLADDER SURGERY    . LEG SURGERY     right  . PARTIAL HYSTERECTOMY      Current Outpatient Prescriptions  Medication Sig Dispense Refill  . ANORO ELLIPTA 62.5-25 MCG/INH AEPB Inhale 1 puff into the lungs daily.    . citalopram (CELEXA) 20 MG tablet Take 20 mg by mouth daily.     . fluticasone (FLONASE) 50 MCG/ACT nasal spray Place 2 sprays into both nostrils 2 (two) times daily as needed for allergies.   1  . levothyroxine (SYNTHROID, LEVOTHROID) 88 MCG tablet Take 88 mcg by mouth daily before breakfast.     . nystatin (MYCOSTATIN) 100000 UNIT/ML suspension 5 mL by mouth (retain in the mouth as long as possible and then swallow) 4 times daily for 14 days. 280 mL 0  . omeprazole (PRILOSEC) 40 MG capsule Take 40 mg by mouth daily.     . simvastatin (ZOCOR) 20 MG tablet Take 20 mg by mouth daily at 6 PM.      No current facility-administered medications for this visit.     Allergies as of 04/25/2017 - Review Complete 04/25/2017  Allergen Reaction Noted  . Desyrel [trazodone]  07/14/2014  . Keflex [cephalexin]  07/14/2014  . Klonopin [clonazepam]  07/14/2014  . Minocin [minocycline hcl]  07/14/2014  . Stelazine [trifluoperazine]  07/14/2014  . Viberzi [eluxadoline]  03/31/2015  Family History  Problem Relation Age of Onset  . Colon cancer Neg Hx     Social History   Social History  . Marital status: Legally Separated    Spouse name: N/A  . Number of children: 2  . Years of education: N/A   Occupational History  . disability    Social History Main Topics  . Smoking status: Current Some Day Smoker    Packs/day: 0.50    Types: Cigarettes  . Smokeless tobacco: Never Used     Comment: almost a pack a day  . Alcohol use No  . Drug use: No  . Sexual activity: Not Asked   Other Topics Concern  . None   Social History  Narrative  . None    Review of Systems: Gen: Denies fever, chills, anorexia. Denies fatigue, weakness, weight loss.  CV: Denies chest pain, palpitations, syncope, peripheral edema, and claudication. Resp: Denies dyspnea at rest, cough, wheezing, coughing up blood, and pleurisy. GI: see HPI  Derm: Denies rash, itching, dry skin Psych: Denies depression, anxiety, memory loss, confusion. No homicidal or suicidal ideation.  Heme: Denies bruising, bleeding, and enlarged lymph nodes.  Physical Exam: BP 116/76   Pulse 79   Temp (!) 97 F (36.1 C) (Oral)   Ht 5\' 4"  (1.626 m)   Wt 160 lb 9.6 oz (72.8 kg)   BMI 27.57 kg/m  General:   Alert and oriented. No distress noted. Pleasant and cooperative.  Head:  Normocephalic and atraumatic. Eyes:  Conjuctiva clear without scleral icterus. Mouth:  Oral mucosa pink and moist. Good dentition. No lesions. Abdomen:  +BS, soft, non-tender and non-distended. No rebound or guarding. No HSM or masses noted. Msk:  Symmetrical without gross deformities. Normal posture. Extremities:  Without edema. Neurologic:  Alert and  oriented x4 Psych:  Alert and cooperative. Normal mood and affect.

## 2017-04-25 NOTE — Patient Instructions (Signed)
I have sent in Levsin to the pharmacy to take for abdominal cramping and loose stool every 4 hours as needed. Call if this is too expensive or not covered.  We will see you in 6 months!

## 2017-04-26 NOTE — Progress Notes (Signed)
cc'ed to pcp °

## 2017-04-26 NOTE — Assessment & Plan Note (Signed)
71 year old female with history of lymphocytic colitis, completing a round of Entocort over the summer and back to baseline habits. 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 Provided Levsin prn. Return in 6 months.

## 2017-06-05 ENCOUNTER — Ambulatory Visit (HOSPITAL_COMMUNITY): Payer: Medicare HMO

## 2017-06-06 DIAGNOSIS — B379 Candidiasis, unspecified: Secondary | ICD-10-CM | POA: Diagnosis not present

## 2017-06-14 ENCOUNTER — Ambulatory Visit (HOSPITAL_COMMUNITY)
Admission: RE | Admit: 2017-06-14 | Discharge: 2017-06-14 | Disposition: A | Discharge status: Home / Self Care | Payer: Medicare HMO | Source: Ambulatory Visit | Attending: Internal Medicine | Admitting: Internal Medicine

## 2017-06-14 ENCOUNTER — Encounter (HOSPITAL_COMMUNITY): Payer: Self-pay

## 2017-06-14 DIAGNOSIS — Z1231 Encounter for screening mammogram for malignant neoplasm of breast: Secondary | ICD-10-CM | POA: Insufficient documentation

## 2017-07-22 DIAGNOSIS — J0191 Acute recurrent sinusitis, unspecified: Secondary | ICD-10-CM | POA: Diagnosis not present

## 2017-07-22 DIAGNOSIS — J440 Chronic obstructive pulmonary disease with acute lower respiratory infection: Secondary | ICD-10-CM | POA: Diagnosis not present

## 2017-08-07 DIAGNOSIS — J449 Chronic obstructive pulmonary disease, unspecified: Secondary | ICD-10-CM | POA: Diagnosis not present

## 2017-08-07 DIAGNOSIS — R600 Localized edema: Secondary | ICD-10-CM | POA: Diagnosis not present

## 2017-08-07 DIAGNOSIS — Z0000 Encounter for general adult medical examination without abnormal findings: Secondary | ICD-10-CM | POA: Diagnosis not present

## 2017-08-07 DIAGNOSIS — M62838 Other muscle spasm: Secondary | ICD-10-CM | POA: Diagnosis not present

## 2017-08-07 DIAGNOSIS — R7301 Impaired fasting glucose: Secondary | ICD-10-CM | POA: Diagnosis not present

## 2017-08-07 DIAGNOSIS — K219 Gastro-esophageal reflux disease without esophagitis: Secondary | ICD-10-CM | POA: Diagnosis not present

## 2017-08-07 DIAGNOSIS — R609 Edema, unspecified: Secondary | ICD-10-CM | POA: Diagnosis not present

## 2017-08-07 DIAGNOSIS — R0602 Shortness of breath: Secondary | ICD-10-CM | POA: Diagnosis not present

## 2017-08-07 DIAGNOSIS — R69 Illness, unspecified: Secondary | ICD-10-CM | POA: Diagnosis not present

## 2017-08-23 DIAGNOSIS — M62838 Other muscle spasm: Secondary | ICD-10-CM | POA: Diagnosis not present

## 2017-08-23 DIAGNOSIS — M7712 Lateral epicondylitis, left elbow: Secondary | ICD-10-CM | POA: Diagnosis not present

## 2017-08-23 DIAGNOSIS — Z6823 Body mass index (BMI) 23.0-23.9, adult: Secondary | ICD-10-CM | POA: Diagnosis not present

## 2017-08-23 DIAGNOSIS — R600 Localized edema: Secondary | ICD-10-CM | POA: Diagnosis not present

## 2017-08-23 DIAGNOSIS — E782 Mixed hyperlipidemia: Secondary | ICD-10-CM | POA: Diagnosis not present

## 2017-08-23 DIAGNOSIS — K52832 Lymphocytic colitis: Secondary | ICD-10-CM | POA: Diagnosis not present

## 2017-08-23 DIAGNOSIS — R69 Illness, unspecified: Secondary | ICD-10-CM | POA: Diagnosis not present

## 2017-08-23 DIAGNOSIS — Z0000 Encounter for general adult medical examination without abnormal findings: Secondary | ICD-10-CM | POA: Diagnosis not present

## 2017-08-23 DIAGNOSIS — K219 Gastro-esophageal reflux disease without esophagitis: Secondary | ICD-10-CM | POA: Diagnosis not present

## 2017-08-23 DIAGNOSIS — R609 Edema, unspecified: Secondary | ICD-10-CM | POA: Diagnosis not present

## 2017-08-23 DIAGNOSIS — E039 Hypothyroidism, unspecified: Secondary | ICD-10-CM | POA: Diagnosis not present

## 2017-08-23 DIAGNOSIS — R0602 Shortness of breath: Secondary | ICD-10-CM | POA: Diagnosis not present

## 2017-08-23 DIAGNOSIS — J449 Chronic obstructive pulmonary disease, unspecified: Secondary | ICD-10-CM | POA: Diagnosis not present

## 2017-08-23 DIAGNOSIS — R7301 Impaired fasting glucose: Secondary | ICD-10-CM | POA: Diagnosis not present

## 2017-08-23 DIAGNOSIS — R945 Abnormal results of liver function studies: Secondary | ICD-10-CM | POA: Diagnosis not present

## 2017-08-24 ENCOUNTER — Other Ambulatory Visit (HOSPITAL_COMMUNITY): Payer: Self-pay | Admitting: Internal Medicine

## 2017-08-24 DIAGNOSIS — R945 Abnormal results of liver function studies: Secondary | ICD-10-CM

## 2017-08-24 DIAGNOSIS — R7989 Other specified abnormal findings of blood chemistry: Secondary | ICD-10-CM

## 2017-08-29 ENCOUNTER — Ambulatory Visit (HOSPITAL_COMMUNITY)
Admission: RE | Admit: 2017-08-29 | Discharge: 2017-08-29 | Disposition: A | Discharge status: Home / Self Care | Payer: Medicare HMO | Source: Ambulatory Visit | Attending: Internal Medicine | Admitting: Internal Medicine

## 2017-08-29 DIAGNOSIS — R7989 Other specified abnormal findings of blood chemistry: Secondary | ICD-10-CM

## 2017-08-29 DIAGNOSIS — K760 Fatty (change of) liver, not elsewhere classified: Secondary | ICD-10-CM | POA: Diagnosis not present

## 2017-08-29 DIAGNOSIS — Z9049 Acquired absence of other specified parts of digestive tract: Secondary | ICD-10-CM | POA: Insufficient documentation

## 2017-08-29 DIAGNOSIS — R945 Abnormal results of liver function studies: Secondary | ICD-10-CM | POA: Insufficient documentation

## 2017-09-06 DIAGNOSIS — H04129 Dry eye syndrome of unspecified lacrimal gland: Secondary | ICD-10-CM | POA: Diagnosis not present

## 2017-09-06 DIAGNOSIS — E785 Hyperlipidemia, unspecified: Secondary | ICD-10-CM | POA: Diagnosis not present

## 2017-09-06 DIAGNOSIS — K219 Gastro-esophageal reflux disease without esophagitis: Secondary | ICD-10-CM | POA: Diagnosis not present

## 2017-09-06 DIAGNOSIS — K5900 Constipation, unspecified: Secondary | ICD-10-CM | POA: Diagnosis not present

## 2017-09-06 DIAGNOSIS — J449 Chronic obstructive pulmonary disease, unspecified: Secondary | ICD-10-CM | POA: Diagnosis not present

## 2017-09-06 DIAGNOSIS — R609 Edema, unspecified: Secondary | ICD-10-CM | POA: Diagnosis not present

## 2017-09-06 DIAGNOSIS — E039 Hypothyroidism, unspecified: Secondary | ICD-10-CM | POA: Diagnosis not present

## 2017-09-06 DIAGNOSIS — M62838 Other muscle spasm: Secondary | ICD-10-CM | POA: Diagnosis not present

## 2017-09-06 DIAGNOSIS — J309 Allergic rhinitis, unspecified: Secondary | ICD-10-CM | POA: Diagnosis not present

## 2017-09-06 DIAGNOSIS — Z7982 Long term (current) use of aspirin: Secondary | ICD-10-CM | POA: Diagnosis not present

## 2017-10-23 ENCOUNTER — Encounter: Payer: Self-pay | Admitting: Gastroenterology

## 2017-10-23 ENCOUNTER — Ambulatory Visit (INDEPENDENT_AMBULATORY_CARE_PROVIDER_SITE_OTHER): Payer: Medicare HMO | Admitting: Gastroenterology

## 2017-10-23 ENCOUNTER — Telehealth: Payer: Self-pay

## 2017-10-23 VITALS — BP 121/79 | HR 99 | Temp 98.4°F | Ht 64.0 in | Wt 162.8 lb

## 2017-10-23 DIAGNOSIS — K760 Fatty (change of) liver, not elsewhere classified: Secondary | ICD-10-CM | POA: Insufficient documentation

## 2017-10-23 DIAGNOSIS — K529 Noninfective gastroenteritis and colitis, unspecified: Secondary | ICD-10-CM | POA: Diagnosis not present

## 2017-10-23 MED ORDER — HYDROCORTISONE 2.5 % RE CREA: 1.0000 "application " | TOPICAL_CREAM | Freq: Two times a day (BID) | RECTAL | 1 refills | Status: DC

## 2017-10-23 NOTE — Telephone Encounter (Signed)
PA info for MRI Liver w/wo contrast submitted via [facility]. Case pending. Service order: [id number]. Will fax clinical notes after OV note is signed.

## 2017-10-23 NOTE — Assessment & Plan Note (Signed)
History of lymphocytic colitis on biopsies in July 2017, improved with entocort at that time. Also had another round of entocort last year and did well. Previous celiac serologies negative, no alarm symptoms. Currently with increased diarrhea over the weekend, which she feels is attributed to dietary intake. Declining intervention currently. She has had flares of hemorrhoid discomfort in this setting. Rectal exam with possible prolapsed internal hemorrhoids that are easily reducible, and external hemorrhoids non-thrombosed. Provided prescription for anusol cream and may use lidocaine OTC prn. Call with progress report and return in 4 months.

## 2017-10-23 NOTE — Progress Notes (Signed)
Primary Care Physician:  Celene Squibb, MD  Primary GI: Dr. Gala Romney   Chief Complaint  Patient presents with  . Diarrhea  . Hemorrhoids    some bleeding  . fatty liver    HPI:   Jennifer [name] is a 72 y.o. female presenting today with a history of lymphocytic colitis, diagnosed in July 2017 at time of colonoscopy. Completed course of entocort and did well. Another flare in June  2018 requiring another round, with stool studies negative. Due for surveillance colonoscopy in 2020.   Cutting down on sprites, and diarrhea improved. Lately, diarrhea has been up to 15 times a day when a bad day. External hemorrhoids getting bad from diarrhea. Preparation H not helpful. Was doing better till this past weekend. No recent antibiotics. Neighbor was sick but not with diarrheal illness. Prior to this was going about once a day or not at all. Hasn't had a BM at all today. Feeling better. Wants to hold off on round of entocort. Unsure about hemorrhoid banding.  US abdomen by PCP with fatty liver, probable cyst in left lobe. Recommending MRI. She tells me her PCP tried to arrange this but insurance would not cover. I will need to retrieve outside LFTs.   Previously, negative celiac serologies, urinary 5-HIAA normal. She is gaining weight.   Past Medical History:  Diagnosis Date  . Depression   . GERD (gastroesophageal reflux disease)   . History of colon polyps    remote past, unable to retrieve records   . Hyperlipidemia   . Hypothyroidism     Past Surgical History:  Procedure Laterality Date  . COLONOSCOPY     last one was about 3 yrs in [place]  . COLONOSCOPY N/A 02/19/2016   Dr. Gala Romney: three 3-6 mm polyps (tubular adenomas) in ascending colon, segmental biopsies consistent with lymphocytic colitis. Prescribed entocort.   . ESOPHAGOGASTRODUODENOSCOPY N/A 08/11/2014   RMR: MIld erosive  reflux esophagitis. small hiatal hernia. Abnormal gastric muocsa uncertain significance. Subtly  abnormal duodeanl (bulbar) mucosa- status post multiple biopsies. BENIGN small bowel mucosa, no evidence of villous blulnting, mild reactive gastropathy on stomach biopsy   . GALLBLADDER SURGERY    . LEG SURGERY     right  . PARTIAL HYSTERECTOMY      Current Outpatient Medications  Medication Sig Dispense Refill  . ANORO ELLIPTA 62.5-25 MCG/INH AEPB Inhale 1 puff into the lungs daily.    . fluticasone (FLONASE) 50 MCG/ACT nasal spray Place 2 sprays into both nostrils 2 (two) times daily as needed for allergies.   1  . levothyroxine (SYNTHROID, LEVOTHROID) 88 MCG tablet Take 88 mcg by mouth daily before breakfast.     . omeprazole (PRILOSEC) 40 MG capsule Take 40 mg by mouth daily.     . simvastatin (ZOCOR) 20 MG tablet Take 20 mg by mouth daily at 6 PM.     . hydrocortisone (ANUSOL-HC) 2.5 % rectal cream Place 1 application rectally 2 (two) times daily. 30 g 1   No current facility-administered medications for this visit.     Allergies as of 10/23/2017 - Review Complete 10/23/2017  Allergen Reaction Noted  . Desyrel [trazodone]  07/14/2014  . Keflex [cephalexin]  07/14/2014  . Klonopin [clonazepam]  07/14/2014  . Minocin [minocycline hcl]  07/14/2014  . Stelazine [trifluoperazine]  07/14/2014  . Viberzi [eluxadoline]  03/31/2015    Family History  Problem Relation Age of Onset  . Colon cancer Neg Hx     Social History  Socioeconomic History  . Marital status: Legally Separated    Spouse name: Not on file  . Number of children: 2  . Years of education: Not on file  . Highest education level: Not on file  Occupational History  . Occupation: disability  Social Needs  . Financial resource strain: Not on file  . Food insecurity:    Worry: Not on file    Inability: Not on file  . Transportation needs:    Medical: Not on file    Non-medical: Not on file  Tobacco Use  . Smoking status: Current Some Day Smoker    Packs/day: 0.50    Types: Cigarettes  . Smokeless  tobacco: Never Used  . Tobacco comment: almost a pack a day  Substance and Sexual Activity  . Alcohol use: No    Alcohol/week: 0.0 oz  . Drug use: No  . Sexual activity: Not on file  Lifestyle  . Physical activity:    Days per week: Not on file    Minutes per session: Not on file  . Stress: Not on file  Relationships  . Social connections:    Talks on phone: Not on file    Gets together: Not on file    Attends religious service: Not on file    Active member of club or organization: Not on file    Attends meetings of clubs or organizations: Not on file    Relationship status: Not on file  Other Topics Concern  . Not on file  Social History Narrative  . Not on file    Review of Systems: Gen: Denies fever, chills, anorexia. Denies fatigue, weakness, weight loss.  CV: Denies chest pain, palpitations, syncope, peripheral edema, and claudication. Resp: Denies dyspnea at rest, cough, wheezing, coughing up blood, and pleurisy. GI: see HPI  Derm: Denies rash, itching, dry skin Psych: Denies depression, anxiety, memory loss, confusion. No homicidal or suicidal ideation.    Physical Exam: BP 121/79   Pulse 99   Temp 98.4 F (36.9 C) (Oral)   Ht 5\' 4"  (1.626 m)   Wt 162 lb 12.8 oz (73.8 kg)   BMI 27.94 kg/m  General:   Alert and oriented. No distress noted. Pleasant and cooperative.  Head:  Normocephalic and atraumatic. Eyes:  Conjuctiva clear without scleral icterus. Mouth:  Oral mucosa pink and moist.  Abdomen:  +BS, soft, mild TTP RUQ at site of prior cholecystectomy, query possible ventral hernia but difficult to appreciate. . No rebound or guarding. No HSM or masses noted. Rectal: non-thrombosed external hemorrhoid,  Msk:  Symmetrical without gross deformities. Normal posture. Extremities:  Without edema. Neurologic:  Alert and  oriented x4 Psych:  Alert and cooperative. Normal mood and affect.

## 2017-10-23 NOTE — Patient Instructions (Signed)
I have sent anusol to the pharmacy. Use this twice a day per rectum. You can also get OTC lidocaine gel to mix with it and use twice a day.   Call me if diarrhea does not improve!  We are arranging an MRI of your liver.   I will see you in 4 months!  It was a pleasure to see you today. I strive to create trusting relationships with patients to provide genuine, compassionate, and quality care. I value your feedback. If you receive a survey regarding your visit,  I greatly appreciate you taking time to fill this out.   Annitta Needs, PhD, ANP-BC [facility]

## 2017-10-23 NOTE — Assessment & Plan Note (Signed)
With reported elevated LFTs. Will request from PCP. US abdomen with fatty liver and liver lesion. Pursuing MRI for further characterization of liver as recommended by radiology.

## 2017-10-23 NOTE — Telephone Encounter (Signed)
Clinical notes faxed to [facility] for MRI PA.

## 2017-10-24 NOTE — Progress Notes (Signed)
cc'ed to pcp °

## 2017-10-25 NOTE — Telephone Encounter (Signed)
Received fax from [facility]. MRI approved. PA# [id number], 10/24/17-01/22/18.

## 2017-10-27 ENCOUNTER — Ambulatory Visit (HOSPITAL_COMMUNITY)
Admission: RE | Admit: 2017-10-27 | Discharge: 2017-10-27 | Disposition: A | Discharge status: Home / Self Care | Payer: Medicare HMO | Source: Ambulatory Visit | Attending: Gastroenterology | Admitting: Gastroenterology

## 2017-10-27 DIAGNOSIS — K760 Fatty (change of) liver, not elsewhere classified: Secondary | ICD-10-CM | POA: Insufficient documentation

## 2017-10-27 DIAGNOSIS — K7689 Other specified diseases of liver: Secondary | ICD-10-CM | POA: Diagnosis not present

## 2017-10-27 DIAGNOSIS — K838 Other specified diseases of biliary tract: Secondary | ICD-10-CM | POA: Diagnosis not present

## 2017-10-27 LAB — POCT I-STAT CREATININE: CREATININE: 0.9 mg/dL (ref 0.44–1.00)

## 2017-10-27 MED ORDER — GADOBENATE DIMEGLUMINE 529 MG/ML IV SOLN
15.0000 mL | Freq: Once | INTRAVENOUS | Status: AC | PRN
  Administered 2017-10-27: 15 mL via INTRAVENOUS

## 2017-11-03 NOTE — Progress Notes (Signed)
MRI with liver cysts, no concerning findings. Keep appt for July.

## 2017-11-17 DIAGNOSIS — H52229 Regular astigmatism, unspecified eye: Secondary | ICD-10-CM | POA: Diagnosis not present

## 2017-11-27 ENCOUNTER — Telehealth: Payer: Self-pay | Admitting: Gastroenterology

## 2017-11-27 NOTE — Telephone Encounter (Signed)
Still awaiting LFTs from PCP. Can we see if they can send the most recent LFTs?

## 2017-11-28 NOTE — Telephone Encounter (Signed)
I have requested again from Dr Juel Burrow office

## 2017-11-30 ENCOUNTER — Encounter: Payer: Self-pay | Admitting: Gastroenterology

## 2017-11-30 DIAGNOSIS — R600 Localized edema: Secondary | ICD-10-CM | POA: Diagnosis not present

## 2017-11-30 DIAGNOSIS — K219 Gastro-esophageal reflux disease without esophagitis: Secondary | ICD-10-CM | POA: Diagnosis not present

## 2017-11-30 DIAGNOSIS — R0602 Shortness of breath: Secondary | ICD-10-CM | POA: Diagnosis not present

## 2017-11-30 DIAGNOSIS — R609 Edema, unspecified: Secondary | ICD-10-CM | POA: Diagnosis not present

## 2017-11-30 DIAGNOSIS — M62838 Other muscle spasm: Secondary | ICD-10-CM | POA: Diagnosis not present

## 2017-11-30 DIAGNOSIS — R69 Illness, unspecified: Secondary | ICD-10-CM | POA: Diagnosis not present

## 2017-11-30 DIAGNOSIS — Z0000 Encounter for general adult medical examination without abnormal findings: Secondary | ICD-10-CM | POA: Diagnosis not present

## 2017-11-30 DIAGNOSIS — R7301 Impaired fasting glucose: Secondary | ICD-10-CM | POA: Diagnosis not present

## 2017-11-30 DIAGNOSIS — J449 Chronic obstructive pulmonary disease, unspecified: Secondary | ICD-10-CM | POA: Diagnosis not present

## 2017-12-04 DIAGNOSIS — E46 Unspecified protein-calorie malnutrition: Secondary | ICD-10-CM | POA: Diagnosis not present

## 2017-12-04 DIAGNOSIS — E039 Hypothyroidism, unspecified: Secondary | ICD-10-CM | POA: Diagnosis not present

## 2017-12-04 DIAGNOSIS — E782 Mixed hyperlipidemia: Secondary | ICD-10-CM | POA: Diagnosis not present

## 2017-12-04 DIAGNOSIS — Z0000 Encounter for general adult medical examination without abnormal findings: Secondary | ICD-10-CM | POA: Diagnosis not present

## 2017-12-04 DIAGNOSIS — R69 Illness, unspecified: Secondary | ICD-10-CM | POA: Diagnosis not present

## 2017-12-04 DIAGNOSIS — J449 Chronic obstructive pulmonary disease, unspecified: Secondary | ICD-10-CM | POA: Diagnosis not present

## 2017-12-04 DIAGNOSIS — Z6827 Body mass index (BMI) 27.0-27.9, adult: Secondary | ICD-10-CM | POA: Diagnosis not present

## 2017-12-04 DIAGNOSIS — K580 Irritable bowel syndrome with diarrhea: Secondary | ICD-10-CM | POA: Diagnosis not present

## 2017-12-04 DIAGNOSIS — M62838 Other muscle spasm: Secondary | ICD-10-CM | POA: Diagnosis not present

## 2017-12-04 DIAGNOSIS — K219 Gastro-esophageal reflux disease without esophagitis: Secondary | ICD-10-CM | POA: Diagnosis not present

## 2017-12-04 NOTE — Telephone Encounter (Signed)
Still haven't received LFTs from PCP (or any recent labs). Can we request again?

## 2017-12-05 NOTE — Telephone Encounter (Signed)
I have requested twice already and today I LMOM at the PCP to send the recent labs (LFTs) and I also faxed a third time and marked as STAT

## 2017-12-22 ENCOUNTER — Encounter: Payer: Self-pay | Admitting: Gastroenterology

## 2017-12-22 ENCOUNTER — Ambulatory Visit: Payer: Medicare HMO | Admitting: Gastroenterology

## 2017-12-22 ENCOUNTER — Encounter

## 2017-12-22 VITALS — BP 118/72 | HR 70 | Temp 98.0°F | Ht 64.0 in | Wt 158.2 lb

## 2017-12-22 DIAGNOSIS — R1013 Epigastric pain: Secondary | ICD-10-CM | POA: Diagnosis not present

## 2017-12-22 DIAGNOSIS — K52832 Lymphocytic colitis: Secondary | ICD-10-CM

## 2017-12-22 DIAGNOSIS — K529 Noninfective gastroenteritis and colitis, unspecified: Secondary | ICD-10-CM | POA: Diagnosis not present

## 2017-12-22 MED ORDER — BUDESONIDE 3 MG PO CPEP: 9.0000 mg | ORAL_CAPSULE | Freq: Every day | ORAL | 1 refills | Status: DC

## 2017-12-22 NOTE — Assessment & Plan Note (Signed)
Upper abdominal pain unclear etiology.  May benefit from changing PPI therapy as she is been on omeprazole for a long time and is having some breakthrough symptoms.  Recent MRI demonstrating dilated common bile duct without etiology.  Recent LFTs were normal although she reports being abnormal in the last several months.  Obtain copy of previous abnormal LFTs for review.  Obtain labs.  Will consider changing PPI therapy if labs are unremarkable.  Further recommendations.

## 2017-12-22 NOTE — Assessment & Plan Note (Signed)
Likely flare of her lymphocytic colitis.  Hopefully will respond to higher dose of Entocort, 9 mg daily.  New prescription provided.  We will go ahead and check labs including electrolytes.  She also has some epigastric discomfort recently, unrelated to meals or bowel habits.  Unclear etiology.  Labs ordered.

## 2017-12-22 NOTE — Patient Instructions (Signed)
1. Please have your labs done today. 2. Start Entocort 9mg  (3 capsules) daily. RX provided. We will provide with appropriate taper instructions based on your response to therapy.

## 2017-12-22 NOTE — Progress Notes (Signed)
Primary Care Physician: Celene Squibb, MD  Primary Gastroenterologist:  Garfield Cornea, MD   Chief Complaint  Patient presents with  . Diarrhea    intermittent  . Abdominal Pain    sometimes moves all over her stomach    HPI: Jennifer [name] is a 72 y.o. female here for following up of diarrhea, abd pain. Seen in 09/2017. H/O lymphocytic colitis, diagnosed at time of colonoscopy in 2017. Took Entocort at that time and did well but required additional round of Entocort in 2018. Surveillance colonoscopy due in 2020 for h/o adenomatous colon polyps. Most recent celiac serologies negative (small bowel bx negative in 2016 - done due to slightly elevated TTG at the time), negative Cdiff, urinary 5-HIAA. Pancreatic elastase normal.   H/o liver cyst?, H/o abnormal LFTs.   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 No suspicious/enhancing hepatic lesions. -Mild intrahepatic ductal dilatation. Dilated common duct, measuring 18 mm, likely postsurgical. No choledocholithiasis is seen.  Patient still struggling with diarrhea.  Every time she eats or drinks she runs to the bathroom.  Episodes of incontinence can be bad.  5-6 stools a day.  Her neighbor gave her a liquid over-the-counter antidiarrheal which helped in the past but recently that did not help either.  Denies any recent antibiotic use.  Denies drinking well water.  She also complains of constant upper abdominal pain sometimes more generalized.  Unrelated to bowel habits or food.  Just happens.  Nothing seems to make it better.  If she does not eat, she does not have a bowel movement.  Chronically on omeprazole for heartburn, has had some epigastric burning.  No melena or rectal bleeding.  When she has a BM but feels like she is passing acid.  Recently tried Entocort she had at home already, took 6 mg a day for couple weeks but no improvement, currently on 3 mg a day.  Current  Outpatient Medications  Medication Sig Dispense Refill  . ANORO ELLIPTA 62.5-25 MCG/INH AEPB Inhale 1 puff into the lungs daily.    . fluticasone (FLONASE) 50 MCG/ACT nasal spray Place 2 sprays into both nostrils 2 (two) times daily as needed for allergies.   1  . levothyroxine (SYNTHROID, LEVOTHROID) 88 MCG tablet Take 88 mcg by mouth daily before breakfast.     . NON FORMULARY Ventolin Inhaler   As needed    . omeprazole (PRILOSEC) 40 MG capsule Take 40 mg by mouth daily.     . simvastatin (ZOCOR) 20 MG tablet Take 20 mg by mouth daily at 6 PM.      No current facility-administered medications for this visit.     Allergies as of 12/22/2017 - Review Complete 12/22/2017  Allergen Reaction Noted  . Desyrel [trazodone]  07/14/2014  . Keflex [cephalexin]  07/14/2014  . Klonopin [clonazepam]  07/14/2014  . Minocin [minocycline hcl]  07/14/2014  . Stelazine [trifluoperazine]  07/14/2014  . Viberzi [eluxadoline]  03/31/2015    ROS:  General: Negative for anorexia, weight loss, fever, chills, fatigue, +weakness wth stools. ENT: Negative for hoarseness, difficulty swallowing , nasal congestion. CV: Negative for chest pain, angina, palpitations, dyspnea on exertion, peripheral edema.  Respiratory: Negative for dyspnea at rest, dyspnea on exertion, cough, sputum, wheezing.  GI: See history of present illness. GU:  Negative for dysuria, hematuria, urinary incontinence, urinary frequency, nocturnal urination.  Endo: Negative for unusual weight change.    Physical Examination:  BP 118/72   Pulse 70   Temp 98 F (36.7 C) (Oral)   Ht 5\' 4"  (1.626 m)   Wt 158 lb 3.2 oz (71.8 kg)   BMI 27.15 kg/m   General: Well-nourished, well-developed in no acute distress. Accompanied by spouse Eyes: No icterus. Mouth: Oropharyngeal mucosa moist and pink , no lesions erythema or exudate. Lungs: Clear to auscultation bilaterally.  Heart: Regular rate and rhythm, no murmurs rubs or gallops.  Abdomen:  Bowel sounds are normal,, nondistended, no hepatosplenomegaly or masses, no abdominal bruits or hernia , no rebound or guarding.  Mild epigastric tenderness Extremities: No lower extremity edema. No clubbing or deformities. Neuro: Alert and oriented x 4   Skin: Warm and dry, no jaundice.   Psych: Alert and cooperative, normal mood and affect.   Imaging Studies: No results found.

## 2017-12-22 NOTE — Telephone Encounter (Signed)
Received labs but they are dated 11/30/17. From what I understand, she had some prior to these.  11/2017:  BUN 7, Cr 0.82, Tbili 0.6, Alk Phos 99, AST 20, ALT 15. TSH 1.630.  We need labs from prior, as she had told me she had elevated LFTs. Glad to see they are normal recently. Appt with Magda Paganini today, 5/24.

## 2017-12-23 LAB — CBC WITH DIFFERENTIAL/PLATELET
BASOS: 0 %
Basophils Absolute: 0 10*3/uL (ref 0.0–0.2)
EOS (ABSOLUTE): 0.1 10*3/uL (ref 0.0–0.4)
EOS: 2 %
HEMATOCRIT: 45.7 % (ref 34.0–46.6)
HEMOGLOBIN: 15.1 g/dL (ref 11.1–15.9)
IMMATURE GRANS (ABS): 0 10*3/uL (ref 0.0–0.1)
IMMATURE GRANULOCYTES: 0 %
LYMPHS: 35 %
Lymphocytes Absolute: 2.3 10*3/uL (ref 0.7–3.1)
MCH: 30.3 pg (ref 26.6–33.0)
MCHC: 33 g/dL (ref 31.5–35.7)
MCV: 92 fL (ref 79–97)
Monocytes Absolute: 0.6 10*3/uL (ref 0.1–0.9)
Monocytes: 9 %
NEUTROS PCT: 54 %
Neutrophils Absolute: 3.6 10*3/uL (ref 1.4–7.0)
Platelets: 221 10*3/uL (ref 150–450)
RBC: 4.98 x10E6/uL (ref 3.77–5.28)
RDW: 13.8 % (ref 12.3–15.4)
WBC: 6.6 10*3/uL (ref 3.4–10.8)

## 2017-12-23 LAB — COMPREHENSIVE METABOLIC PANEL
ALT: 52 IU/L — AB (ref 0–32)
AST: 37 IU/L (ref 0–40)
Albumin/Globulin Ratio: 1.7 (ref 1.2–2.2)
Albumin: 4 g/dL (ref 3.5–4.8)
Alkaline Phosphatase: 156 IU/L — ABNORMAL HIGH (ref 39–117)
BUN/Creatinine Ratio: 9 — ABNORMAL LOW (ref 12–28)
BUN: 7 mg/dL — ABNORMAL LOW (ref 8–27)
Bilirubin Total: 0.3 mg/dL (ref 0.0–1.2)
CO2: 28 mmol/L (ref 20–29)
CREATININE: 0.79 mg/dL (ref 0.57–1.00)
Calcium: 8.8 mg/dL (ref 8.7–10.3)
Chloride: 104 mmol/L (ref 96–106)
GFR calc Af Amer: 87 mL/min/{1.73_m2} (ref >59)
GFR, EST NON AFRICAN AMERICAN: 76 mL/min/{1.73_m2} (ref >59)
GLOBULIN, TOTAL: 2.3 g/dL (ref 1.5–4.5)
Glucose: 94 mg/dL (ref 65–99)
Potassium: 4.6 mmol/L (ref 3.5–5.2)
SODIUM: 143 mmol/L (ref 134–144)
Total Protein: 6.3 g/dL (ref 6.0–8.5)

## 2017-12-23 LAB — LIPASE: Lipase: 26 U/L (ref 14–85)

## 2017-12-26 NOTE — Progress Notes (Signed)
CC'D TO PCP °

## 2018-01-05 ENCOUNTER — Other Ambulatory Visit: Payer: Self-pay

## 2018-01-05 DIAGNOSIS — K760 Fatty (change of) liver, not elsewhere classified: Secondary | ICD-10-CM

## 2018-01-05 NOTE — Progress Notes (Signed)
Pt is aware. She will go to get labs done. Rx called to [facility] at [facility].  Forwarding to Manuela Schwartz to get labs from PCP.

## 2018-01-09 DIAGNOSIS — K760 Fatty (change of) liver, not elsewhere classified: Secondary | ICD-10-CM | POA: Diagnosis not present

## 2018-01-10 LAB — HEPATITIS C ANTIBODY: Hep C Virus Ab: 0.1 s/co ratio (ref 0.0–0.9)

## 2018-01-10 LAB — IRON,TIBC AND FERRITIN PANEL
FERRITIN: 87 ng/mL (ref 15–150)
IRON SATURATION: 47 % (ref 15–55)
Iron: 155 ug/dL — ABNORMAL HIGH (ref 27–139)
TIBC: 327 ug/dL (ref 250–450)
UIBC: 172 ug/dL (ref 118–369)

## 2018-01-10 LAB — HEPATITIS B SURFACE ANTIGEN: HEP B S AG: NEGATIVE

## 2018-01-10 LAB — GAMMA GT: GGT: 24 IU/L (ref 0–60)

## 2018-01-10 LAB — MITOCHONDRIAL ANTIBODIES: Mitochondrial Ab: <20 Units (ref 0.0–20.0)

## 2018-01-16 ENCOUNTER — Other Ambulatory Visit: Payer: Self-pay

## 2018-01-16 DIAGNOSIS — R748 Abnormal levels of other serum enzymes: Secondary | ICD-10-CM

## 2018-01-17 ENCOUNTER — Encounter: Payer: Self-pay | Admitting: Internal Medicine

## 2018-01-17 DIAGNOSIS — M62838 Other muscle spasm: Secondary | ICD-10-CM | POA: Diagnosis not present

## 2018-01-17 DIAGNOSIS — R600 Localized edema: Secondary | ICD-10-CM | POA: Diagnosis not present

## 2018-01-17 DIAGNOSIS — R609 Edema, unspecified: Secondary | ICD-10-CM | POA: Diagnosis not present

## 2018-01-17 DIAGNOSIS — R252 Cramp and spasm: Secondary | ICD-10-CM | POA: Diagnosis not present

## 2018-01-17 DIAGNOSIS — Z6828 Body mass index (BMI) 28.0-28.9, adult: Secondary | ICD-10-CM | POA: Diagnosis not present

## 2018-01-17 DIAGNOSIS — Z0000 Encounter for general adult medical examination without abnormal findings: Secondary | ICD-10-CM | POA: Diagnosis not present

## 2018-01-17 DIAGNOSIS — R7301 Impaired fasting glucose: Secondary | ICD-10-CM | POA: Diagnosis not present

## 2018-01-17 DIAGNOSIS — J449 Chronic obstructive pulmonary disease, unspecified: Secondary | ICD-10-CM | POA: Diagnosis not present

## 2018-01-17 DIAGNOSIS — R0602 Shortness of breath: Secondary | ICD-10-CM | POA: Diagnosis not present

## 2018-01-17 DIAGNOSIS — K219 Gastro-esophageal reflux disease without esophagitis: Secondary | ICD-10-CM | POA: Diagnosis not present

## 2018-01-17 NOTE — Progress Notes (Signed)
PATIENT SCHEDULED AND LETTER SENT  °

## 2018-01-29 DIAGNOSIS — R69 Illness, unspecified: Secondary | ICD-10-CM | POA: Diagnosis not present

## 2018-01-29 DIAGNOSIS — R252 Cramp and spasm: Secondary | ICD-10-CM | POA: Diagnosis not present

## 2018-01-29 DIAGNOSIS — R600 Localized edema: Secondary | ICD-10-CM | POA: Diagnosis not present

## 2018-01-29 DIAGNOSIS — Z0000 Encounter for general adult medical examination without abnormal findings: Secondary | ICD-10-CM | POA: Diagnosis not present

## 2018-01-29 DIAGNOSIS — J449 Chronic obstructive pulmonary disease, unspecified: Secondary | ICD-10-CM | POA: Diagnosis not present

## 2018-01-29 DIAGNOSIS — R0602 Shortness of breath: Secondary | ICD-10-CM | POA: Diagnosis not present

## 2018-01-29 DIAGNOSIS — R7301 Impaired fasting glucose: Secondary | ICD-10-CM | POA: Diagnosis not present

## 2018-01-29 DIAGNOSIS — E782 Mixed hyperlipidemia: Secondary | ICD-10-CM | POA: Diagnosis not present

## 2018-01-29 DIAGNOSIS — K219 Gastro-esophageal reflux disease without esophagitis: Secondary | ICD-10-CM | POA: Diagnosis not present

## 2018-01-29 DIAGNOSIS — R609 Edema, unspecified: Secondary | ICD-10-CM | POA: Diagnosis not present

## 2018-01-29 DIAGNOSIS — M62838 Other muscle spasm: Secondary | ICD-10-CM | POA: Diagnosis not present

## 2018-02-02 ENCOUNTER — Other Ambulatory Visit: Payer: Self-pay | Admitting: Gastroenterology

## 2018-02-02 NOTE — Telephone Encounter (Signed)
Please let patient know to take one more month of Entocort 9mg  daily.   After that, go down to 6mg  daily for one month, then 3mg  daily until ov in 04/2018.

## 2018-02-06 NOTE — Telephone Encounter (Signed)
Pt notified and will take medication as directed.

## 2018-02-19 DIAGNOSIS — R609 Edema, unspecified: Secondary | ICD-10-CM | POA: Diagnosis not present

## 2018-02-19 DIAGNOSIS — K219 Gastro-esophageal reflux disease without esophagitis: Secondary | ICD-10-CM | POA: Diagnosis not present

## 2018-02-19 DIAGNOSIS — R600 Localized edema: Secondary | ICD-10-CM | POA: Diagnosis not present

## 2018-02-19 DIAGNOSIS — M62838 Other muscle spasm: Secondary | ICD-10-CM | POA: Diagnosis not present

## 2018-02-19 DIAGNOSIS — R69 Illness, unspecified: Secondary | ICD-10-CM | POA: Diagnosis not present

## 2018-02-19 DIAGNOSIS — Z0000 Encounter for general adult medical examination without abnormal findings: Secondary | ICD-10-CM | POA: Diagnosis not present

## 2018-02-19 DIAGNOSIS — R7301 Impaired fasting glucose: Secondary | ICD-10-CM | POA: Diagnosis not present

## 2018-02-19 DIAGNOSIS — J449 Chronic obstructive pulmonary disease, unspecified: Secondary | ICD-10-CM | POA: Diagnosis not present

## 2018-02-19 DIAGNOSIS — R0602 Shortness of breath: Secondary | ICD-10-CM | POA: Diagnosis not present

## 2018-02-22 ENCOUNTER — Ambulatory Visit: Payer: Medicare HMO | Admitting: Nurse Practitioner

## 2018-02-22 DIAGNOSIS — Z712 Person consulting for explanation of examination or test findings: Secondary | ICD-10-CM | POA: Diagnosis not present

## 2018-02-22 DIAGNOSIS — R69 Illness, unspecified: Secondary | ICD-10-CM | POA: Diagnosis not present

## 2018-02-22 DIAGNOSIS — R252 Cramp and spasm: Secondary | ICD-10-CM | POA: Diagnosis not present

## 2018-02-22 DIAGNOSIS — E876 Hypokalemia: Secondary | ICD-10-CM | POA: Diagnosis not present

## 2018-02-22 DIAGNOSIS — Z6829 Body mass index (BMI) 29.0-29.9, adult: Secondary | ICD-10-CM | POA: Diagnosis not present

## 2018-02-22 DIAGNOSIS — N3946 Mixed incontinence: Secondary | ICD-10-CM | POA: Diagnosis not present

## 2018-03-05 DIAGNOSIS — R7301 Impaired fasting glucose: Secondary | ICD-10-CM | POA: Diagnosis not present

## 2018-03-05 DIAGNOSIS — E782 Mixed hyperlipidemia: Secondary | ICD-10-CM | POA: Diagnosis not present

## 2018-03-05 DIAGNOSIS — J440 Chronic obstructive pulmonary disease with acute lower respiratory infection: Secondary | ICD-10-CM | POA: Diagnosis not present

## 2018-03-05 DIAGNOSIS — R600 Localized edema: Secondary | ICD-10-CM | POA: Diagnosis not present

## 2018-03-05 DIAGNOSIS — K219 Gastro-esophageal reflux disease without esophagitis: Secondary | ICD-10-CM | POA: Diagnosis not present

## 2018-03-05 DIAGNOSIS — E039 Hypothyroidism, unspecified: Secondary | ICD-10-CM | POA: Diagnosis not present

## 2018-03-05 DIAGNOSIS — R609 Edema, unspecified: Secondary | ICD-10-CM | POA: Diagnosis not present

## 2018-03-05 DIAGNOSIS — R69 Illness, unspecified: Secondary | ICD-10-CM | POA: Diagnosis not present

## 2018-03-13 ENCOUNTER — Other Ambulatory Visit: Payer: Self-pay

## 2018-03-13 DIAGNOSIS — R748 Abnormal levels of other serum enzymes: Secondary | ICD-10-CM

## 2018-03-21 ENCOUNTER — Ambulatory Visit: Payer: Medicare HMO | Admitting: Nurse Practitioner

## 2018-03-22 DIAGNOSIS — J0191 Acute recurrent sinusitis, unspecified: Secondary | ICD-10-CM | POA: Diagnosis not present

## 2018-03-22 DIAGNOSIS — E46 Unspecified protein-calorie malnutrition: Secondary | ICD-10-CM | POA: Diagnosis not present

## 2018-03-22 DIAGNOSIS — E039 Hypothyroidism, unspecified: Secondary | ICD-10-CM | POA: Diagnosis not present

## 2018-03-22 DIAGNOSIS — R7301 Impaired fasting glucose: Secondary | ICD-10-CM | POA: Diagnosis not present

## 2018-03-22 DIAGNOSIS — R5383 Other fatigue: Secondary | ICD-10-CM | POA: Diagnosis not present

## 2018-03-22 DIAGNOSIS — R270 Ataxia, unspecified: Secondary | ICD-10-CM | POA: Diagnosis not present

## 2018-03-22 DIAGNOSIS — R945 Abnormal results of liver function studies: Secondary | ICD-10-CM | POA: Diagnosis not present

## 2018-03-22 DIAGNOSIS — Z6829 Body mass index (BMI) 29.0-29.9, adult: Secondary | ICD-10-CM | POA: Diagnosis not present

## 2018-03-22 DIAGNOSIS — R69 Illness, unspecified: Secondary | ICD-10-CM | POA: Diagnosis not present

## 2018-03-22 DIAGNOSIS — M7712 Lateral epicondylitis, left elbow: Secondary | ICD-10-CM | POA: Diagnosis not present

## 2018-03-22 DIAGNOSIS — M79606 Pain in leg, unspecified: Secondary | ICD-10-CM | POA: Diagnosis not present

## 2018-03-22 DIAGNOSIS — B379 Candidiasis, unspecified: Secondary | ICD-10-CM | POA: Diagnosis not present

## 2018-03-22 DIAGNOSIS — M62838 Other muscle spasm: Secondary | ICD-10-CM | POA: Diagnosis not present

## 2018-03-29 DIAGNOSIS — R269 Unspecified abnormalities of gait and mobility: Secondary | ICD-10-CM | POA: Diagnosis not present

## 2018-03-29 DIAGNOSIS — R270 Ataxia, unspecified: Secondary | ICD-10-CM | POA: Diagnosis not present

## 2018-03-29 DIAGNOSIS — Z6828 Body mass index (BMI) 28.0-28.9, adult: Secondary | ICD-10-CM | POA: Diagnosis not present

## 2018-03-29 DIAGNOSIS — R609 Edema, unspecified: Secondary | ICD-10-CM | POA: Diagnosis not present

## 2018-03-29 DIAGNOSIS — E039 Hypothyroidism, unspecified: Secondary | ICD-10-CM | POA: Diagnosis not present

## 2018-03-29 DIAGNOSIS — R221 Localized swelling, mass and lump, neck: Secondary | ICD-10-CM | POA: Diagnosis not present

## 2018-04-06 NOTE — Progress Notes (Signed)
Discussed with RMR regarding dilated CBD on MRI 09/2017. He advised to follow clinically and repeat LFTs in six months. Will recheck at upcoming [place].

## 2018-04-16 ENCOUNTER — Ambulatory Visit (HOSPITAL_COMMUNITY)
Admission: RE | Admit: 2018-04-16 | Discharge: 2018-04-16 | Disposition: A | Discharge status: Home / Self Care | Payer: Medicare HMO | Source: Ambulatory Visit | Attending: Adult Health Nurse Practitioner | Admitting: Adult Health Nurse Practitioner

## 2018-04-16 ENCOUNTER — Other Ambulatory Visit (HOSPITAL_COMMUNITY): Payer: Self-pay | Admitting: Adult Health Nurse Practitioner

## 2018-04-16 DIAGNOSIS — M79672 Pain in left foot: Secondary | ICD-10-CM | POA: Diagnosis not present

## 2018-04-16 DIAGNOSIS — W010XXA Fall on same level from slipping, tripping and stumbling without subsequent striking against object, initial encounter: Secondary | ICD-10-CM | POA: Diagnosis not present

## 2018-04-16 DIAGNOSIS — Z9181 History of falling: Secondary | ICD-10-CM | POA: Diagnosis not present

## 2018-04-16 DIAGNOSIS — W19XXXA Unspecified fall, initial encounter: Secondary | ICD-10-CM

## 2018-04-16 DIAGNOSIS — S99912A Unspecified injury of left ankle, initial encounter: Secondary | ICD-10-CM | POA: Diagnosis not present

## 2018-04-16 DIAGNOSIS — M7989 Other specified soft tissue disorders: Secondary | ICD-10-CM | POA: Diagnosis not present

## 2018-04-16 DIAGNOSIS — R600 Localized edema: Secondary | ICD-10-CM | POA: Diagnosis not present

## 2018-04-16 DIAGNOSIS — M25572 Pain in left ankle and joints of left foot: Secondary | ICD-10-CM | POA: Diagnosis not present

## 2018-04-16 DIAGNOSIS — Z6829 Body mass index (BMI) 29.0-29.9, adult: Secondary | ICD-10-CM | POA: Diagnosis not present

## 2018-04-16 DIAGNOSIS — S99922A Unspecified injury of left foot, initial encounter: Secondary | ICD-10-CM | POA: Diagnosis not present

## 2018-04-17 DIAGNOSIS — S93492A Sprain of other ligament of left ankle, initial encounter: Secondary | ICD-10-CM | POA: Diagnosis not present

## 2018-04-20 DIAGNOSIS — R748 Abnormal levels of other serum enzymes: Secondary | ICD-10-CM | POA: Diagnosis not present

## 2018-04-21 LAB — IRON,TIBC AND FERRITIN PANEL
Ferritin: 104 ng/mL (ref 15–150)
Iron Saturation: 35 % (ref 15–55)
Iron: 84 ug/dL (ref 27–139)
TIBC: 240 ug/dL — AB (ref 250–450)
UIBC: 156 ug/dL (ref 118–369)

## 2018-04-23 ENCOUNTER — Other Ambulatory Visit (HOSPITAL_COMMUNITY): Payer: Self-pay | Admitting: Internal Medicine

## 2018-04-23 DIAGNOSIS — R269 Unspecified abnormalities of gait and mobility: Secondary | ICD-10-CM

## 2018-04-24 ENCOUNTER — Encounter: Payer: Self-pay | Admitting: Gastroenterology

## 2018-04-24 ENCOUNTER — Ambulatory Visit: Payer: Medicare HMO | Admitting: Gastroenterology

## 2018-04-24 VITALS — BP 123/84 | HR 92 | Temp 97.8°F | Ht 64.0 in | Wt 170.6 lb

## 2018-04-24 DIAGNOSIS — J441 Chronic obstructive pulmonary disease with (acute) exacerbation: Secondary | ICD-10-CM | POA: Diagnosis not present

## 2018-04-24 DIAGNOSIS — K838 Other specified diseases of biliary tract: Secondary | ICD-10-CM | POA: Diagnosis not present

## 2018-04-24 DIAGNOSIS — R945 Abnormal results of liver function studies: Secondary | ICD-10-CM | POA: Diagnosis not present

## 2018-04-24 DIAGNOSIS — R062 Wheezing: Secondary | ICD-10-CM | POA: Diagnosis not present

## 2018-04-24 DIAGNOSIS — R1030 Lower abdominal pain, unspecified: Secondary | ICD-10-CM

## 2018-04-24 DIAGNOSIS — R7989 Other specified abnormal findings of blood chemistry: Secondary | ICD-10-CM | POA: Insufficient documentation

## 2018-04-24 DIAGNOSIS — R05 Cough: Secondary | ICD-10-CM | POA: Diagnosis not present

## 2018-04-24 NOTE — Progress Notes (Signed)
Primary Care Physician: Celene Squibb, MD  Primary Gastroenterologist:  Garfield Cornea, MD   Chief Complaint  Patient presents with  . Abdominal Pain    mid lower abd    HPI: Jennifer [name] is a 72 y.o. female here for follow up. Last seen in 11/2017. She has h/o diarrhea, abd pain with known lymphocytic colitis, diagnosed in 2017. Treated with Entocort.  Surveillance TCS due in 2020 for h/o adenomatous colon polyps.   H/O dilated CBD, measuring 2mm, likely postsurgical. No CBD stones on mri. Following clinically. In 11/2017 her AP was 156, ALT 52. Her Hep B/C, AMA negative. Iron was 155, ferritin 87. Repeat iron studies last week, iron 84, ferritin 104, AP 86, ALT 21.   Patient quit smoking 3 weeks ago. Feels better in a lot of ways. Her BMs have been fine. No diarrhea, melena, brbpr. Her upper abd pain resolved. She has occasional pelvic pain, noted this morning with coughing. Also happened when she received dye for MRI. Does not happen frequently. No postprandial pain. Unaffected by BMs. No hb on pantoprazole.   Current Outpatient Medications  Medication Sig Dispense Refill  . albuterol (VENTOLIN HFA) 108 (90 Base) MCG/ACT inhaler Inhale 2 puffs into the lungs every 6 (six) hours as needed for wheezing or shortness of breath.    [name] ELLIPTA 62.5-25 MCG/INH AEPB Inhale 1 puff into the lungs daily.    Marland Kitchen aspirin EC 81 MG tablet Take 81 mg by mouth daily.    . cycloSPORINE (RESTASIS) 0.05 % ophthalmic emulsion Place 1 drop into both eyes 2 (two) times daily.    Marland Kitchen Dextromethorphan-guaiFENesin (MUCINEX DM MAXIMUM STRENGTH) 60-1200 MG TB12 Take by mouth as needed.    . diphenhydrAMINE HCl (EQ ALLERGY RELIEF PO) Take by mouth as needed.    . fluticasone (FLONASE) 50 MCG/ACT nasal spray Place 2 sprays into both nostrils 2 (two) times daily as needed for allergies.   1  . levothyroxine (SYNTHROID, LEVOTHROID) 88 MCG tablet Take 88 mcg by mouth daily before breakfast.     . magnesium  gluconate (MAGONATE) 500 MG tablet Take 1,000 mg by mouth daily.    . NON FORMULARY Ventolin Inhaler   As needed    . pantoprazole (PROTONIX) 40 MG tablet Take 40 mg by mouth daily.    . simvastatin (ZOCOR) 20 MG tablet Take 20 mg by mouth daily at 6 PM.     . tiZANidine (ZANAFLEX) 2 MG tablet Take by mouth every 8 (eight) hours as needed for muscle spasms.    Marland Kitchen torsemide (DEMADEX) 20 MG tablet Take 40 mg by mouth daily.     No current facility-administered medications for this visit.     Allergies as of 04/24/2018 - Review Complete 04/24/2018  Allergen Reaction Noted  . Desyrel [trazodone]  07/14/2014  . Keflex [cephalexin]  07/14/2014  . Klonopin [clonazepam]  07/14/2014  . Minocin [minocycline hcl]  07/14/2014  . Stelazine [trifluoperazine]  07/14/2014  . Viberzi [eluxadoline]  03/31/2015    ROS:  General: Negative for anorexia, weight loss, fever, chills, fatigue, weakness. ENT: Negative for hoarseness, difficulty swallowing , nasal congestion. CV: Negative for chest pain, angina, palpitations, dyspnea on exertion, peripheral edema.  Respiratory: Negative for dyspnea at rest, dyspnea on exertion, cough, sputum, wheezing.  GI: See history of present illness. GU:  Negative for dysuria, hematuria, urinary incontinence, urinary frequency, nocturnal urination.  Endo: Negative for unusual weight change.    Physical Examination:  BP 123/84   Pulse 92   Temp 97.8 F (36.6 C) (Other (Comment))   Ht 5\' 4"  (1.626 m)   Wt 170 lb 9.6 oz (77.4 kg)   BMI 29.28 kg/m   General: Well-nourished, well-developed in no acute distress.  Eyes: No icterus. Mouth: Oropharyngeal mucosa moist and pink , no lesions erythema or exudate. Lungs: Clear to auscultation bilaterally.  Heart: Regular rate and rhythm, no murmurs rubs or gallops.  Abdomen: Bowel sounds are normal, nontender, nondistended, no hepatosplenomegaly or masses, no abdominal bruits or hernia , no rebound or guarding.     Extremities: No lower extremity edema. No clubbing or deformities. Neuro: Alert and oriented x 4   Skin: Warm and dry, no jaundice.   Psych: Alert and cooperative, normal mood and affect.  Labs:  See above  Imaging Studies: Dg Ankle Complete Left  Result Date: 04/16/2018 CLINICAL DATA:  Patient slipped and fell 2 days ago. Left ankle and foot pain and swelling since. EXAM: LEFT ANKLE COMPLETE - 3+ VIEW COMPARISON:  None. FINDINGS: The bones appear adequately mineralized. There is a possible small avulsion fracture involving the fibular tip, best seen on the oblique view. No other evidence of acute fracture or dislocation. A small posterior calcaneal spur is noted. There is mild lateral soft tissue swelling. IMPRESSION: Lateral soft tissue swelling with possible small avulsion fracture from the fibular tip. Electronically Signed   By: Richardean Sale M.D.   On: 04/16/2018 11:55   Dg Foot Complete Left  Result Date: 04/16/2018 CLINICAL DATA:  Patient slipped and fell 2 days ago. Left ankle and foot pain and swelling since. EXAM: LEFT FOOT - COMPLETE 3+ VIEW COMPARISON:  None. FINDINGS: The bones appear adequately mineralized. No evidence of acute fracture or dislocation. There are mild degenerative changes at the 1st metatarsophalangeal joint. The middle 4th phalanx is diminutive, possibly due to prior surgery or remote trauma. There is posterior calcaneal spurring. No focal soft tissue swelling identified. IMPRESSION: No evidence of acute left foot injury. Electronically Signed   By: Richardean Sale M.D.   On: 04/16/2018 11:54

## 2018-04-24 NOTE — Patient Instructions (Addendum)
  1. Call if you have persistent lower abdominal pain and we will arrange for CT scan. 2. We will let you know if you have to go back to the lab for the liver blood work. Hopefully the lab has enough sample to add it on.

## 2018-04-25 ENCOUNTER — Telehealth: Payer: Self-pay | Admitting: Internal Medicine

## 2018-04-25 ENCOUNTER — Other Ambulatory Visit: Payer: Self-pay

## 2018-04-25 DIAGNOSIS — R748 Abnormal levels of other serum enzymes: Secondary | ICD-10-CM

## 2018-04-25 NOTE — Telephone Encounter (Signed)
Spoke with pt and she was given results.

## 2018-04-25 NOTE — Telephone Encounter (Signed)
Pt said she was returning a call from AM. I told her AM was at lunch and would call her back. [phone]

## 2018-04-26 LAB — HEPATIC FUNCTION PANEL
ALBUMIN: 3.4 g/dL — AB (ref 3.5–4.8)
ALK PHOS: 86 IU/L (ref 39–117)
ALT: 21 IU/L (ref 0–32)
AST: 25 IU/L (ref 0–40)
Bilirubin Total: 0.3 mg/dL (ref 0.0–1.2)
Bilirubin, Direct: 0.11 mg/dL (ref 0.00–0.40)
Total Protein: 5.4 g/dL — ABNORMAL LOW (ref 6.0–8.5)

## 2018-04-26 LAB — SPECIMEN STATUS REPORT

## 2018-04-28 NOTE — Assessment & Plan Note (Signed)
Resolved. Dilated CBD likely postsurgical. Follow clinically. Return for OV 11/2018 for follow up.

## 2018-04-28 NOTE — Assessment & Plan Note (Signed)
Intermittent lower abd pain worse with coughing, unrelated to meals or BMs. ?adhesions. No obvious hernia. Monitor for now, if worsening would consider CT A/P with contrast. Patient will call if needed. Otherwise see back in 11/2018 for follow up. Will be due for colonoscopy at that time.

## 2018-04-30 NOTE — Progress Notes (Signed)
cc'd to pcp 

## 2018-05-09 DIAGNOSIS — S93402D Sprain of unspecified ligament of left ankle, subsequent encounter: Secondary | ICD-10-CM | POA: Diagnosis not present

## 2018-05-09 DIAGNOSIS — S93401D Sprain of unspecified ligament of right ankle, subsequent encounter: Secondary | ICD-10-CM | POA: Diagnosis not present

## 2018-05-11 ENCOUNTER — Ambulatory Visit (HOSPITAL_COMMUNITY): Payer: Medicare HMO

## 2018-05-12 DIAGNOSIS — B373 Candidiasis of vulva and vagina: Secondary | ICD-10-CM | POA: Diagnosis not present

## 2018-05-12 DIAGNOSIS — Z6829 Body mass index (BMI) 29.0-29.9, adult: Secondary | ICD-10-CM | POA: Diagnosis not present

## 2018-05-16 ENCOUNTER — Telehealth: Payer: Self-pay | Admitting: Internal Medicine

## 2018-05-17 ENCOUNTER — Encounter (HOSPITAL_COMMUNITY): Payer: Self-pay

## 2018-05-17 ENCOUNTER — Ambulatory Visit (HOSPITAL_COMMUNITY): Payer: Medicare HMO

## 2018-05-18 ENCOUNTER — Other Ambulatory Visit (HOSPITAL_COMMUNITY): Payer: Self-pay | Admitting: Internal Medicine

## 2018-05-18 DIAGNOSIS — Z1231 Encounter for screening mammogram for malignant neoplasm of breast: Secondary | ICD-10-CM

## 2018-05-25 ENCOUNTER — Ambulatory Visit (HOSPITAL_COMMUNITY): Payer: Medicare HMO

## 2018-05-25 ENCOUNTER — Other Ambulatory Visit (HOSPITAL_COMMUNITY): Payer: Self-pay | Admitting: Internal Medicine

## 2018-05-25 DIAGNOSIS — R269 Unspecified abnormalities of gait and mobility: Secondary | ICD-10-CM

## 2018-05-28 ENCOUNTER — Ambulatory Visit (HOSPITAL_COMMUNITY)
Admission: RE | Admit: 2018-05-28 | Discharge: 2018-05-28 | Disposition: A | Discharge status: Home / Self Care | Payer: Medicare HMO | Source: Ambulatory Visit | Attending: Internal Medicine | Admitting: Internal Medicine

## 2018-05-28 DIAGNOSIS — Z888 Allergy status to other drugs, medicaments and biological substances status: Secondary | ICD-10-CM | POA: Insufficient documentation

## 2018-05-28 DIAGNOSIS — R269 Unspecified abnormalities of gait and mobility: Secondary | ICD-10-CM | POA: Diagnosis not present

## 2018-05-28 LAB — POCT I-STAT CREATININE: Creatinine, Ser: 0.7 mg/dL (ref 0.44–1.00)

## 2018-05-28 MED ORDER — IOHEXOL 300 MG/ML  SOLN
75.0000 mL | Freq: Once | INTRAMUSCULAR | Status: AC | PRN
  Administered 2018-05-28: 75 mL via INTRAVENOUS

## 2018-05-30 DIAGNOSIS — S93401D Sprain of unspecified ligament of right ankle, subsequent encounter: Secondary | ICD-10-CM | POA: Diagnosis not present

## 2018-06-04 DIAGNOSIS — M62838 Other muscle spasm: Secondary | ICD-10-CM | POA: Diagnosis not present

## 2018-06-04 DIAGNOSIS — R600 Localized edema: Secondary | ICD-10-CM | POA: Diagnosis not present

## 2018-06-04 DIAGNOSIS — R69 Illness, unspecified: Secondary | ICD-10-CM | POA: Diagnosis not present

## 2018-06-04 DIAGNOSIS — R0602 Shortness of breath: Secondary | ICD-10-CM | POA: Diagnosis not present

## 2018-06-04 DIAGNOSIS — K219 Gastro-esophageal reflux disease without esophagitis: Secondary | ICD-10-CM | POA: Diagnosis not present

## 2018-06-04 DIAGNOSIS — J449 Chronic obstructive pulmonary disease, unspecified: Secondary | ICD-10-CM | POA: Diagnosis not present

## 2018-06-04 DIAGNOSIS — Z0000 Encounter for general adult medical examination without abnormal findings: Secondary | ICD-10-CM | POA: Diagnosis not present

## 2018-06-04 DIAGNOSIS — R609 Edema, unspecified: Secondary | ICD-10-CM | POA: Diagnosis not present

## 2018-06-04 DIAGNOSIS — R7301 Impaired fasting glucose: Secondary | ICD-10-CM | POA: Diagnosis not present

## 2018-06-08 DIAGNOSIS — Z0000 Encounter for general adult medical examination without abnormal findings: Secondary | ICD-10-CM | POA: Diagnosis not present

## 2018-06-08 DIAGNOSIS — E782 Mixed hyperlipidemia: Secondary | ICD-10-CM | POA: Diagnosis not present

## 2018-06-08 DIAGNOSIS — J449 Chronic obstructive pulmonary disease, unspecified: Secondary | ICD-10-CM | POA: Diagnosis not present

## 2018-06-08 DIAGNOSIS — K210 Gastro-esophageal reflux disease with esophagitis: Secondary | ICD-10-CM | POA: Diagnosis not present

## 2018-06-08 DIAGNOSIS — E039 Hypothyroidism, unspecified: Secondary | ICD-10-CM | POA: Diagnosis not present

## 2018-06-11 DIAGNOSIS — Z23 Encounter for immunization: Secondary | ICD-10-CM | POA: Diagnosis not present

## 2018-06-18 ENCOUNTER — Ambulatory Visit (HOSPITAL_COMMUNITY): Payer: Medicare HMO

## 2018-06-22 ENCOUNTER — Other Ambulatory Visit: Payer: Self-pay

## 2018-06-22 ENCOUNTER — Emergency Department (HOSPITAL_COMMUNITY)
Admission: EM | Admit: 2018-06-22 | Discharge: 2018-06-22 | Disposition: A | Discharge status: Home / Self Care | Payer: Medicare HMO | Attending: Emergency Medicine | Admitting: Emergency Medicine

## 2018-06-22 ENCOUNTER — Encounter (HOSPITAL_COMMUNITY): Payer: Self-pay | Admitting: Emergency Medicine

## 2018-06-22 DIAGNOSIS — M542 Cervicalgia: Secondary | ICD-10-CM | POA: Diagnosis not present

## 2018-06-22 DIAGNOSIS — M549 Dorsalgia, unspecified: Secondary | ICD-10-CM | POA: Insufficient documentation

## 2018-06-22 DIAGNOSIS — Z79899 Other long term (current) drug therapy: Secondary | ICD-10-CM | POA: Insufficient documentation

## 2018-06-22 DIAGNOSIS — Z87891 Personal history of nicotine dependence: Secondary | ICD-10-CM | POA: Insufficient documentation

## 2018-06-22 DIAGNOSIS — Z7982 Long term (current) use of aspirin: Secondary | ICD-10-CM | POA: Diagnosis not present

## 2018-06-22 DIAGNOSIS — Y9389 Activity, other specified: Secondary | ICD-10-CM | POA: Insufficient documentation

## 2018-06-22 DIAGNOSIS — M79602 Pain in left arm: Secondary | ICD-10-CM | POA: Insufficient documentation

## 2018-06-22 DIAGNOSIS — Y92410 Unspecified street and highway as the place of occurrence of the external cause: Secondary | ICD-10-CM | POA: Insufficient documentation

## 2018-06-22 DIAGNOSIS — Y999 Unspecified external cause status: Secondary | ICD-10-CM | POA: Insufficient documentation

## 2018-06-22 DIAGNOSIS — E039 Hypothyroidism, unspecified: Secondary | ICD-10-CM | POA: Insufficient documentation

## 2018-06-22 MED ORDER — TIZANIDINE HCL 2 MG PO TABS: 2.0000 mg | ORAL_TABLET | Freq: Three times a day (TID) | ORAL | 0 refills | Status: DC | PRN
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
## 2018-06-26 DIAGNOSIS — M25512 Pain in left shoulder: Secondary | ICD-10-CM | POA: Diagnosis not present

## 2018-06-26 DIAGNOSIS — S46012A Strain of muscle(s) and tendon(s) of the rotator cuff of left shoulder, initial encounter: Secondary | ICD-10-CM | POA: Diagnosis not present

## 2018-06-26 DIAGNOSIS — S161XXA Strain of muscle, fascia and tendon at neck level, initial encounter: Secondary | ICD-10-CM | POA: Diagnosis not present

## 2018-07-03 ENCOUNTER — Other Ambulatory Visit (HOSPITAL_COMMUNITY): Payer: Self-pay | Admitting: Internal Medicine

## 2018-07-03 DIAGNOSIS — M545 Low back pain, unspecified: Secondary | ICD-10-CM

## 2018-07-03 DIAGNOSIS — H65199 Other acute nonsuppurative otitis media, unspecified ear: Secondary | ICD-10-CM | POA: Diagnosis not present

## 2018-07-04 ENCOUNTER — Other Ambulatory Visit (HOSPITAL_COMMUNITY): Payer: Self-pay | Admitting: Internal Medicine

## 2018-07-04 ENCOUNTER — Ambulatory Visit (HOSPITAL_COMMUNITY)
Admission: RE | Admit: 2018-07-04 | Discharge: 2018-07-04 | Disposition: A | Discharge status: Home / Self Care | Payer: Medicare HMO | Source: Ambulatory Visit | Attending: Internal Medicine | Admitting: Internal Medicine

## 2018-07-04 DIAGNOSIS — S3993XA Unspecified injury of pelvis, initial encounter: Secondary | ICD-10-CM | POA: Diagnosis not present

## 2018-07-04 DIAGNOSIS — M545 Low back pain, unspecified: Secondary | ICD-10-CM

## 2018-07-04 DIAGNOSIS — S3992XA Unspecified injury of lower back, initial encounter: Secondary | ICD-10-CM | POA: Diagnosis not present

## 2018-07-04 DIAGNOSIS — M533 Sacrococcygeal disorders, not elsewhere classified: Secondary | ICD-10-CM | POA: Diagnosis not present

## 2018-07-16 DIAGNOSIS — E559 Vitamin D deficiency, unspecified: Secondary | ICD-10-CM | POA: Diagnosis not present

## 2018-07-16 DIAGNOSIS — S46012A Strain of muscle(s) and tendon(s) of the rotator cuff of left shoulder, initial encounter: Secondary | ICD-10-CM | POA: Diagnosis not present

## 2018-07-16 DIAGNOSIS — S161XXA Strain of muscle, fascia and tendon at neck level, initial encounter: Secondary | ICD-10-CM | POA: Diagnosis not present

## 2018-07-16 DIAGNOSIS — M545 Low back pain: Secondary | ICD-10-CM | POA: Diagnosis not present

## 2018-07-16 DIAGNOSIS — Z1321 Encounter for screening for nutritional disorder: Secondary | ICD-10-CM | POA: Diagnosis not present

## 2018-07-16 DIAGNOSIS — R69 Illness, unspecified: Secondary | ICD-10-CM | POA: Diagnosis not present

## 2018-07-16 DIAGNOSIS — M25512 Pain in left shoulder: Secondary | ICD-10-CM | POA: Diagnosis not present

## 2018-07-16 DIAGNOSIS — M8580 Other specified disorders of bone density and structure, unspecified site: Secondary | ICD-10-CM | POA: Diagnosis not present

## 2018-07-16 DIAGNOSIS — H65199 Other acute nonsuppurative otitis media, unspecified ear: Secondary | ICD-10-CM | POA: Diagnosis not present

## 2018-07-16 DIAGNOSIS — K210 Gastro-esophageal reflux disease with esophagitis: Secondary | ICD-10-CM | POA: Diagnosis not present

## 2018-07-19 ENCOUNTER — Other Ambulatory Visit (HOSPITAL_COMMUNITY): Payer: Self-pay | Admitting: Internal Medicine

## 2018-07-19 DIAGNOSIS — Z780 Asymptomatic menopausal state: Secondary | ICD-10-CM

## 2018-07-23 DIAGNOSIS — M542 Cervicalgia: Secondary | ICD-10-CM | POA: Diagnosis not present

## 2018-07-23 DIAGNOSIS — M545 Low back pain: Secondary | ICD-10-CM | POA: Diagnosis not present

## 2018-08-07 DIAGNOSIS — M545 Low back pain: Secondary | ICD-10-CM | POA: Diagnosis not present

## 2018-08-10 DIAGNOSIS — M545 Low back pain: Secondary | ICD-10-CM | POA: Diagnosis not present

## 2018-08-15 DIAGNOSIS — R05 Cough: Secondary | ICD-10-CM | POA: Diagnosis not present

## 2018-08-15 DIAGNOSIS — J019 Acute sinusitis, unspecified: Secondary | ICD-10-CM | POA: Diagnosis not present

## 2018-08-16 ENCOUNTER — Ambulatory Visit (HOSPITAL_COMMUNITY): Payer: Medicare HMO

## 2018-08-17 ENCOUNTER — Ambulatory Visit (HOSPITAL_COMMUNITY): Payer: No Typology Code available for payment source | Admitting: Physical Therapy

## 2018-08-17 ENCOUNTER — Telehealth (HOSPITAL_COMMUNITY): Payer: Self-pay | Admitting: Internal Medicine

## 2018-08-17 ENCOUNTER — Encounter (HOSPITAL_COMMUNITY): Payer: Self-pay

## 2018-08-17 NOTE — Telephone Encounter (Signed)
08/17/18  pt called to cancel said that she was sneezing, coughing and has diarrhea... she will call back later to reschedule

## 2018-08-27 ENCOUNTER — Ambulatory Visit (HOSPITAL_COMMUNITY): Payer: Medicare HMO | Attending: Orthopedic Surgery | Admitting: Physical Therapy

## 2018-08-27 ENCOUNTER — Other Ambulatory Visit: Payer: Self-pay

## 2018-08-27 ENCOUNTER — Encounter (HOSPITAL_COMMUNITY): Payer: Self-pay | Admitting: Physical Therapy

## 2018-08-27 DIAGNOSIS — M545 Low back pain, unspecified: Secondary | ICD-10-CM
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
## 2018-08-27 NOTE — Patient Instructions (Addendum)
Backward Bend (Standing)    Arch backward to make hollow of back deeper. Hold _2___ seconds. Repeat __10__ times per set. Do _1___ sets per session. Do 2____ sessions per day.  http://orth.exer.us/178   Copyright  VHI. All rights reserved.  Thoracolumbar Side-Bend: Single Arm (Standing)    Reach over head to other side with  left arm until stretch is felt. Hold _2___ seconds. Relax. Repeat _10___ times per set. Do __1__ sets per session. Do __2__ sessions per day.  http://orth.exer.us/262   Copyright  VHI. All rights reserved.

## 2018-08-28 ENCOUNTER — Encounter: Payer: Self-pay | Admitting: Gastroenterology

## 2018-08-28 ENCOUNTER — Ambulatory Visit: Payer: Medicare HMO | Admitting: Gastroenterology

## 2018-08-28 VITALS — BP 119/76 | HR 62 | Temp 97.1°F | Ht 64.0 in | Wt 160.8 lb

## 2018-08-28 DIAGNOSIS — R197 Diarrhea, unspecified: Secondary | ICD-10-CM

## 2018-08-28 NOTE — Progress Notes (Signed)
Primary Care Physician: Celene Squibb, MD  Primary Gastroenterologist:  Garfield Cornea, MD   Chief Complaint  Patient presents with  . Diarrhea    HPI: Jennifer [name] is a 73 y.o. female here for follow-up.  She was last seen in September.  She has a history of diarrhea, abdominal pain with known lymphocytic colitis, diagnosed in 2017.  Treated with Entocort previously.  Plans for surveillance colonoscopy later this year for adenomatous colon polyps.  Two weeks worse diarrhea. 5-8 per day. Hemorrhoids inflammed. 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 Prep H helps with rectal irritation. Since last ov was down to 3-4 stools per day. Took antibiotic recently for sinus problems. Was on amoxicillin. Since then up to 5-8 stools per day. Stools have been black. Denies pepto.  She has h/o dilated CBD evaluated at time of MRI 09/2017. ?postsurgical. Measures up to 12mm and tapers at ampulla. Previous LFTs elevated but normal 4 months ago.   Current Outpatient Medications  Medication Sig Dispense Refill  . albuterol (VENTOLIN HFA) 108 (90 Base) MCG/ACT inhaler Inhale 2 puffs into the lungs every 6 (six) hours as needed for wheezing or shortness of breath.    [name] ELLIPTA 62.5-25 MCG/INH AEPB Inhale 1 puff into the lungs daily.    . cycloSPORINE (RESTASIS) 0.05 % ophthalmic emulsion Place 1 drop into both eyes 2 (two) times daily.    Marland Kitchen Dextromethorphan-guaiFENesin (MUCINEX DM MAXIMUM STRENGTH) 60-1200 MG TB12 Take by mouth as needed.    . diphenhydrAMINE HCl (EQ ALLERGY RELIEF PO) Take by mouth as needed.    . fluticasone (FLONASE) 50 MCG/ACT nasal spray Place 2 sprays into both nostrils 2 (two) times daily as needed for allergies.   1  . levothyroxine (SYNTHROID, LEVOTHROID) 100 MCG tablet Take 100 mcg by mouth daily before breakfast.    . magnesium gluconate (MAGONATE) 500 MG tablet Take 1,000 mg by mouth daily.    . pantoprazole (PROTONIX) 40 MG tablet Take 40 mg by mouth daily.     . simvastatin (ZOCOR) 20 MG tablet Take 20 mg by mouth daily at 6 PM.     . torsemide (DEMADEX) 20 MG tablet Take 40 mg by mouth daily.     No current facility-administered medications for this visit.     Allergies as of 08/28/2018 - Review Complete 08/28/2018  Allergen Reaction Noted  . Desyrel [trazodone]  07/14/2014  . Keflex [cephalexin]  07/14/2014  . Klonopin [clonazepam]  07/14/2014  . Minocin [minocycline hcl]  07/14/2014  . Stelazine [trifluoperazine]  07/14/2014  . Viberzi [eluxadoline]  03/31/2015    ROS:   General: Negative for anorexia, weight loss, fever, chills, fatigue, weakness. ENT: Negative for hoarseness, difficulty swallowing , nasal congestion. CV: Negative for chest pain, angina, palpitations, dyspnea on exertion, peripheral edema.  Respiratory: Negative for dyspnea at rest, dyspnea on exertion, cough, sputum, wheezing.  GI: See history of present illness. GU:  Negative for dysuria, hematuria, urinary incontinence, urinary frequency, nocturnal urination.  Endo: Negative for unusual weight change.    Physical Examination:   BP 119/76   Pulse 62   Temp (!) 97.1 F (36.2 C) (Oral)   Ht 5\' 4"  (1.626 m)   Wt 160 lb 12.8 oz (72.9 kg)   BMI 27.60 kg/m   General: Well-nourished, well-developed in no acute distress.  Eyes: No icterus. Mouth: Oropharyngeal mucosa moist and pink , no lesions erythema or exudate. Lungs: Clear to auscultation bilaterally.  Heart:  Regular rate and rhythm, no murmurs rubs or gallops.  Abdomen: Bowel sounds are normal, nontender, nondistended, no hepatosplenomegaly or masses, no abdominal bruits or hernia , no rebound or guarding.   Extremities: No lower extremity edema. No clubbing or deformities. Neuro: Alert and oriented x 4   Skin: Warm and dry, no jaundice.   Psych: Alert and cooperative, normal mood and affect.   Imaging Studies: No results found.

## 2018-08-28 NOTE — Patient Instructions (Signed)
1. Please collect stool as soon as possible. Get your lab work done too. 2. Once we have results back we can determine next step ie antibiotic versus steroid as discussed.  3. Continue to try and hydrate to keep urine light yellow.

## 2018-08-30 ENCOUNTER — Ambulatory Visit (HOSPITAL_COMMUNITY): Payer: Medicare HMO

## 2018-08-30 ENCOUNTER — Telehealth (HOSPITAL_COMMUNITY): Payer: Self-pay

## 2018-08-30 NOTE — Telephone Encounter (Signed)
08-30-18 Patient is not feeling good today

## 2018-08-31 NOTE — Assessment & Plan Note (Signed)
Acute on chronic diarrhea with h/o lymphocytic colitis. Recent antibiotic exposure puts her at increased risk of Cdiff colitis. Will check stool studies. If negative, would give Entocort. Given h/o black stools, we will check CBC today. Encourage ongoing hydration.

## 2018-09-03 NOTE — Progress Notes (Signed)
cc'd to pcp 

## 2018-09-04 ENCOUNTER — Ambulatory Visit (HOSPITAL_COMMUNITY): Payer: Medicare HMO | Attending: Orthopedic Surgery | Admitting: Physical Therapy

## 2018-09-04 ENCOUNTER — Encounter (HOSPITAL_COMMUNITY): Payer: Self-pay | Admitting: Physical Therapy

## 2018-09-04 DIAGNOSIS — M545 Low back pain, unspecified: Secondary | ICD-10-CM

## 2018-09-04 NOTE — Patient Instructions (Addendum)
   Copyright  VHI. All rights reserved.  Single Leg - Eyes Open    Holding support, lift right leg while maintaining balance over other leg. Progress to removing hands from support surface for longer periods of time. Hold___15_ seconds.  Repeat to left  Repeat _3___ times per session. D2o ____ sessions per day.  Copyright  VHI. All rights reserved.  ____ sessions per day.  http://orth.exer.us/734   Copyright  VHI. All rights reserved.  Sitting to Standing    With straight back, tighten stomach, place right leg back under chair, lean slightly forward and stand. Repeat _10___ times per set. Do __1__ sets per session. Do __2__ sessions per day.  http://orth.exer.us/1140   Copyright  VHI. All rights reserved.

## 2018-09-04 NOTE — Therapy (Signed)
[place], [place], [address] Phone: [phone]   Fax:  [phone]  Physical Therapy Treatment  Patient Details  Name: Jennifer [name] MRN: [id number] Date of Birth: 1945/09/26 Referring Provider (PT): Phylliss Bob   Encounter Date: 09/04/2018  PT End of Session - 09/04/18 0919    Visit Number  2    Number of Visits  8    Date for PT Re-Evaluation  09/26/18    Authorization Type  [employer] medicare     PT Start Time  0845    PT Stop Time  0926    PT Time Calculation (min)  41 min    Activity Tolerance  Patient tolerated treatment well    Behavior During Therapy  [facility] for tasks assessed/performed       Past Medical History:  Diagnosis Date  . Depression   . GERD (gastroesophageal reflux disease)   . History of colon polyps    remote past, unable to retrieve records   . Hyperlipidemia   . Hypothyroidism     Past Surgical History:  Procedure Laterality Date  . COLONOSCOPY     last one was about 3 yrs in [place]  . COLONOSCOPY N/A 02/19/2016   Dr. Gala Romney: three 3-6 mm polyps (tubular adenomas) in ascending colon, segmental biopsies consistent with lymphocytic colitis. Prescribed entocort.   . ESOPHAGOGASTRODUODENOSCOPY N/A 08/11/2014   RMR: MIld erosive  reflux esophagitis. small hiatal hernia. Abnormal gastric muocsa uncertain significance. Subtly abnormal duodeanl (bulbar) mucosa- status post multiple biopsies. BENIGN small bowel mucosa, no evidence of villous blulnting, mild reactive gastropathy on stomach biopsy   . GALLBLADDER SURGERY    . LEG SURGERY     right  . PARTIAL HYSTERECTOMY      There were no vitals filed for this visit.  Subjective Assessment - 09/04/18 0841    Subjective  Pt state that she has been completing her HEP.      Pertinent History  chronic diarrhea     How long can you sit comfortably?  able to sit for 45 minutes     How long can you stand comfortably?  able to stand for 20  minutes     How long can you walk comfortably?  able to walk for  10 minutes     Patient Stated Goals  less pain, walk, stand and sit longer. Sleep better     Currently in Pain?  Yes    Pain Score  4     Pain Location  Back    Pain Orientation  Right;Left;Mid;Lower    Pain Descriptors / Indicators  Aching    Pain Type  Chronic pain    Pain Onset  More than a month ago    Pain Frequency  Constant    Aggravating Factors   walk     Pain Relieving Factors  meds; stretching     Effect of Pain on Daily Activities  limits                [facility] Adult PT Treatment/Exercise - 09/04/18 0001      Ambulation/Gait   Ambulation Distance (Feet)  --    Assistive device  --    Gait Comments  --      Posture/Postural Control   Posture/Postural Control  No significant limitations      Exercises   Exercises  Lumbar      Lumbar Exercises: Stretches   Standing Side Bend  --  Standing Extension  --    Other Lumbar Stretch Exercise  thoracic and 3 D hip excursions x 3 each       Lumbar Exercises: Aerobic   Nustep  6 minutes Level 2 hills 3    done at end of session no charge.      Lumbar Exercises: Standing   Heel Raises  10 reps    Functional Squats  10 reps    Forward Lunge  5 reps    Forward Lunge Limitations  B     Scapular Retraction  Strengthening;Both;10 reps;Theraband    Theraband Level (Scapular Retraction)  Level 2 (Red)    Row  Strengthening;Both;10 reps;Theraband    Theraband Level (Row)  Level 2 (Red)    Shoulder Extension  Strengthening;Both;10 reps;Theraband    Theraband Level (Shoulder Extension)  Level 2 (Red)      Lumbar Exercises: Seated   Other Seated Lumbar Exercises  sit tall hold 3 -5 seconds relax x 10                PT Short Term Goals - 09/04/18 0920      PT SHORT TERM GOAL #1   Title  PT to be able to sit for 60 minutes without increased pain to be able to eat out     Time  2    Period  Weeks    Status  On-going      PT SHORT TERM GOAL  #2   Title  Pt pain to be no greater than a 5/10 to allow pt to complete 30 minutes of light housework without difficulty.     Time  2    Period  Weeks    Status  On-going      PT SHORT TERM GOAL #3   Title  PT to be able to walk for 20 minutes without increased pain to allow pt to complete short shopping trips.    Time  2    Period  Weeks    Status  On-going        PT Long Term Goals - 09/04/18 0920      PT LONG TERM GOAL #1   Title  PT pain to be no greater than a 3/10 to allow pt to ambulate for 30 minutes to allow pt to complete her shopping.     Time  4    Period  Weeks    Status  On-going      PT LONG TERM GOAL #2   Title  PT back and hip ROM to increase to allow pt to be able to pick items off the floor without increased pain.     Time  4    Period  Weeks    Status  On-going      PT LONG TERM GOAL #3   Title  PT to be able to sleep at least 5 hours throughout the night without waking from back pain.     Time  4    Period  Weeks    Status  On-going      PT LONG TERM GOAL #4   Title  Pt to be able to single leg stance for 20 seconds bilaterally to allow pt to feel confident walking on uneven ground.     Time  4    Period  Weeks    Status  On-going            Plan - 09/04/18 0925    Clinical  Impression Statement  Goals and evaluation reviewed with patient.  Began higher lumbar stabilization with verbal cues needed to keep pt in proper alignment.      Rehab Potential  Good    PT Frequency  2x / week    PT Duration  4 weeks    PT Treatment/Interventions  ADLs/Self Care Home Management;Therapeutic activities;Therapeutic exercise;Balance training;Patient/family education    PT Next Visit Plan  Add  lifting instructions for both 12" to waist as well as waist to shoulder level height.     PT Home Exercise Plan  tall sitting, back extension and Rt SB     Consulted and Agree with Plan of Care  Patient       Patient will benefit from skilled therapeutic  intervention in order to improve the following deficits and impairments:  Decreased activity tolerance, Decreased balance, Decreased range of motion, Decreased mobility, Decreased strength, Difficulty walking, Impaired perceived functional ability, Impaired flexibility, Pain  Visit Diagnosis: Acute bilateral low back pain, unspecified whether sciatica present     Problem List Patient Active Problem List   Diagnosis Date Noted  . Abnormal LFTs 04/24/2018  . Dilated cbd, acquired 04/24/2018  . Fatty (change of) liver, not elsewhere classified 10/23/2017  . 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 Chronic diarrhea   . History of colonic polyps 01/28/2016  . Encounter for screening colonoscopy 09/28/2015  . Diarrhea 07/14/2014  . Abdominal pain, epigastric 07/14/2014  . Lower abdominal pain 07/14/2014  . GERD (gastroesophageal reflux disease) 07/14/2014   Rayetta Humphrey, PT CLT [phone] 09/04/2018, 9:33 AM  [facility] [address] [place], [place], [address] Phone: [phone]   Fax:  [phone]  Name: Jennifer [name] MRN: [id number] Date of Birth: June 07, 1946

## 2018-09-05 ENCOUNTER — Telehealth: Payer: Self-pay | Admitting: Internal Medicine

## 2018-09-05 DIAGNOSIS — R69 Illness, unspecified: Secondary | ICD-10-CM | POA: Diagnosis not present

## 2018-09-05 DIAGNOSIS — E039 Hypothyroidism, unspecified: Secondary | ICD-10-CM | POA: Diagnosis not present

## 2018-09-05 DIAGNOSIS — R609 Edema, unspecified: Secondary | ICD-10-CM | POA: Diagnosis not present

## 2018-09-05 DIAGNOSIS — R7301 Impaired fasting glucose: Secondary | ICD-10-CM | POA: Diagnosis not present

## 2018-09-05 DIAGNOSIS — J440 Chronic obstructive pulmonary disease with acute lower respiratory infection: Secondary | ICD-10-CM | POA: Diagnosis not present

## 2018-09-05 DIAGNOSIS — E782 Mixed hyperlipidemia: Secondary | ICD-10-CM | POA: Diagnosis not present

## 2018-09-05 DIAGNOSIS — K219 Gastro-esophageal reflux disease without esophagitis: Secondary | ICD-10-CM | POA: Diagnosis not present

## 2018-09-05 DIAGNOSIS — J449 Chronic obstructive pulmonary disease, unspecified: Secondary | ICD-10-CM | POA: Diagnosis not present

## 2018-09-05 NOTE — Telephone Encounter (Signed)
Called Dr. Juel Burrow office with no response. Left a VM asking someone to call back. Called [facility] lab and nothing has been turn in to them.

## 2018-09-05 NOTE — Telephone Encounter (Signed)
Someone from Dr Juel Burrow office called to follow up if patient has had her labs to check for c-diff. I told him that all I see is the order and no results. He kept questioning me if patient had turned in stool and I told him I don't know because it would go to the lab and not [place]. I told him again that no results were in epic and I could only see the order. PLEASE call Dr Juel Burrow office at [phone] or [phone]

## 2018-09-05 NOTE — Telephone Encounter (Signed)
Spoke to the receptionist. She is aware that the pt hasn't done testing according to [facility]. The pharmacist Ovid Curd at Dr. Juel Burrow office also spoke to the pt and is aware she didn't complete testing.

## 2018-09-06 ENCOUNTER — Telehealth (HOSPITAL_COMMUNITY): Payer: Self-pay

## 2018-09-06 ENCOUNTER — Ambulatory Visit (HOSPITAL_COMMUNITY): Payer: Medicare HMO

## 2018-09-06 ENCOUNTER — Encounter: Payer: Self-pay | Admitting: *Deleted

## 2018-09-06 ENCOUNTER — Other Ambulatory Visit: Payer: Self-pay | Admitting: *Deleted

## 2018-09-06 DIAGNOSIS — K529 Noninfective gastroenteritis and colitis, unspecified: Secondary | ICD-10-CM | POA: Diagnosis not present

## 2018-09-06 DIAGNOSIS — E785 Hyperlipidemia, unspecified: Secondary | ICD-10-CM | POA: Diagnosis not present

## 2018-09-06 DIAGNOSIS — H04129 Dry eye syndrome of unspecified lacrimal gland: Secondary | ICD-10-CM | POA: Diagnosis not present

## 2018-09-06 DIAGNOSIS — E039 Hypothyroidism, unspecified: Secondary | ICD-10-CM | POA: Diagnosis not present

## 2018-09-06 DIAGNOSIS — K08409 Partial loss of teeth, unspecified cause, unspecified class: Secondary | ICD-10-CM | POA: Diagnosis not present

## 2018-09-06 DIAGNOSIS — K219 Gastro-esophageal reflux disease without esophagitis: Secondary | ICD-10-CM | POA: Diagnosis not present

## 2018-09-06 DIAGNOSIS — M2550 Pain in unspecified joint: Secondary | ICD-10-CM | POA: Diagnosis not present

## 2018-09-06 DIAGNOSIS — J449 Chronic obstructive pulmonary disease, unspecified: Secondary | ICD-10-CM | POA: Diagnosis not present

## 2018-09-06 DIAGNOSIS — J309 Allergic rhinitis, unspecified: Secondary | ICD-10-CM | POA: Diagnosis not present

## 2018-09-06 DIAGNOSIS — G8929 Other chronic pain: Secondary | ICD-10-CM | POA: Diagnosis not present

## 2018-09-06 DIAGNOSIS — R748 Abnormal levels of other serum enzymes: Secondary | ICD-10-CM

## 2018-09-06 NOTE — Telephone Encounter (Signed)
No show #1, called and left message concerning missed apt today.  Reminded next apt date and time with contact information given.  Included no show policy details.  [address], [place]; [employer] [phone]

## 2018-09-11 ENCOUNTER — Telehealth (HOSPITAL_COMMUNITY): Payer: Self-pay | Admitting: Internal Medicine

## 2018-09-11 ENCOUNTER — Ambulatory Visit (HOSPITAL_COMMUNITY): Payer: Medicare HMO | Admitting: Physical Therapy

## 2018-09-11 DIAGNOSIS — R197 Diarrhea, unspecified: Secondary | ICD-10-CM | POA: Diagnosis not present

## 2018-09-11 NOTE — Telephone Encounter (Signed)
09/11/18  pt left a message to cx because she is sick ... she does want to reschedule the other appts that were cx once she gets to the dr about this diarrhea and get it taken care of

## 2018-09-12 ENCOUNTER — Telehealth: Payer: Self-pay | Admitting: Internal Medicine

## 2018-09-12 LAB — CBC
HCT: 42.9 % (ref 35.0–45.0)
Hemoglobin: 14.1 g/dL (ref 11.7–15.5)
MCH: 29.3 pg (ref 27.0–33.0)
MCHC: 32.9 g/dL (ref 32.0–36.0)
MCV: 89 fL (ref 80.0–100.0)
MPV: 10.3 fL (ref 7.5–12.5)
Platelets: 188 10*3/uL (ref 140–400)
RBC: 4.82 10*6/uL (ref 3.80–5.10)
RDW: 11.8 % (ref 11.0–15.0)
WBC: 5.7 10*3/uL (ref 3.8–10.8)

## 2018-09-12 LAB — C. DIFFICILE GDH AND TOXIN A/B
GDH ANTIGEN: NOT DETECTED
MICRO NUMBER:: 182490
SPECIMEN QUALITY:: ADEQUATE
TOXIN A AND B: NOT DETECTED

## 2018-09-12 NOTE — Telephone Encounter (Signed)
I don't see where we ordered this for her. Dr. Nevada Crane placed order in December 2019 for her to have this done. She needs to contact them

## 2018-09-12 NOTE — Telephone Encounter (Signed)
Called patient and told her to contact Dr. Nevada Crane

## 2018-09-12 NOTE — Telephone Encounter (Signed)
Patient sent me a mychart message stating she needed a Dexa scan.

## 2018-09-13 ENCOUNTER — Telehealth (HOSPITAL_COMMUNITY): Payer: Self-pay | Admitting: Internal Medicine

## 2018-09-13 ENCOUNTER — Ambulatory Visit (HOSPITAL_COMMUNITY): Payer: Medicare HMO | Admitting: Physical Therapy

## 2018-09-13 DIAGNOSIS — E785 Hyperlipidemia, unspecified: Secondary | ICD-10-CM | POA: Diagnosis not present

## 2018-09-13 DIAGNOSIS — R69 Illness, unspecified: Secondary | ICD-10-CM | POA: Diagnosis not present

## 2018-09-13 DIAGNOSIS — M545 Low back pain: Secondary | ICD-10-CM | POA: Diagnosis not present

## 2018-09-13 DIAGNOSIS — S46012A Strain of muscle(s) and tendon(s) of the rotator cuff of left shoulder, initial encounter: Secondary | ICD-10-CM | POA: Diagnosis not present

## 2018-09-13 DIAGNOSIS — H65199 Other acute nonsuppurative otitis media, unspecified ear: Secondary | ICD-10-CM | POA: Diagnosis not present

## 2018-09-13 DIAGNOSIS — S161XXA Strain of muscle, fascia and tendon at neck level, initial encounter: Secondary | ICD-10-CM | POA: Diagnosis not present

## 2018-09-13 DIAGNOSIS — K210 Gastro-esophageal reflux disease with esophagitis: Secondary | ICD-10-CM | POA: Diagnosis not present

## 2018-09-13 DIAGNOSIS — M25512 Pain in left shoulder: Secondary | ICD-10-CM | POA: Diagnosis not present

## 2018-09-13 DIAGNOSIS — E559 Vitamin D deficiency, unspecified: Secondary | ICD-10-CM | POA: Diagnosis not present

## 2018-09-13 NOTE — Telephone Encounter (Signed)
/  13/20  pt left a message to cx the rest of her appts and she will contact us when she gets rid of the diarrhea

## 2018-09-18 ENCOUNTER — Ambulatory Visit (HOSPITAL_COMMUNITY): Payer: Medicare HMO

## 2018-09-18 ENCOUNTER — Encounter: Payer: Self-pay | Admitting: Internal Medicine

## 2018-09-18 NOTE — Progress Notes (Signed)
Patient scheduled and letter sent  °

## 2018-09-19 DIAGNOSIS — E782 Mixed hyperlipidemia: Secondary | ICD-10-CM | POA: Diagnosis not present

## 2018-09-19 DIAGNOSIS — J449 Chronic obstructive pulmonary disease, unspecified: Secondary | ICD-10-CM | POA: Diagnosis not present

## 2018-09-19 DIAGNOSIS — K52832 Lymphocytic colitis: Secondary | ICD-10-CM | POA: Diagnosis not present

## 2018-09-19 DIAGNOSIS — R600 Localized edema: Secondary | ICD-10-CM | POA: Diagnosis not present

## 2018-09-19 DIAGNOSIS — E46 Unspecified protein-calorie malnutrition: Secondary | ICD-10-CM | POA: Diagnosis not present

## 2018-09-19 DIAGNOSIS — E039 Hypothyroidism, unspecified: Secondary | ICD-10-CM | POA: Diagnosis not present

## 2018-09-19 DIAGNOSIS — R945 Abnormal results of liver function studies: Secondary | ICD-10-CM | POA: Diagnosis not present

## 2018-09-19 DIAGNOSIS — M545 Low back pain: Secondary | ICD-10-CM | POA: Diagnosis not present

## 2018-09-20 ENCOUNTER — Other Ambulatory Visit: Payer: Self-pay

## 2018-09-20 ENCOUNTER — Telehealth: Payer: Self-pay | Admitting: Internal Medicine

## 2018-09-20 ENCOUNTER — Ambulatory Visit (HOSPITAL_COMMUNITY): Payer: Medicare HMO | Admitting: Physical Therapy

## 2018-09-20 ENCOUNTER — Other Ambulatory Visit: Payer: Self-pay | Admitting: Gastroenterology

## 2018-09-20 MED ORDER — BUDESONIDE 3 MG PO CPEP: 9.0000 mg | ORAL_CAPSULE | Freq: Every day | ORAL | 0 refills | Status: DC

## 2018-09-20 MED ORDER — BUDESONIDE 3 MG PO CPEP: 9.0000 mg | ORAL_CAPSULE | Freq: Every day | ORAL | 2 refills | Status: DC

## 2018-09-20 NOTE — Telephone Encounter (Signed)
Pt said she called the other day needing a refill on her stomach med (doesn't know the name of it). She said [facility] hasn't received anything from [place] yet.

## 2018-09-20 NOTE — Telephone Encounter (Signed)
Spoke with pt. Medication was sent to her pharmacy.

## 2018-09-28 DIAGNOSIS — J019 Acute sinusitis, unspecified: Secondary | ICD-10-CM | POA: Diagnosis not present

## 2018-09-28 DIAGNOSIS — J060 Acute laryngopharyngitis: Secondary | ICD-10-CM | POA: Diagnosis not present

## 2018-10-01 ENCOUNTER — Encounter (HOSPITAL_COMMUNITY): Payer: Self-pay | Admitting: Physical Therapy

## 2018-10-01 ENCOUNTER — Ambulatory Visit (HOSPITAL_COMMUNITY): Payer: Medicare HMO | Attending: Orthopedic Surgery | Admitting: Physical Therapy

## 2018-10-01 DIAGNOSIS — M545 Low back pain, unspecified: Secondary | ICD-10-CM

## 2018-10-01 NOTE — Patient Instructions (Addendum)
Isometric Abdominal    Lying on back with knees bent, tighten stomach by pressing elbows down. Hold __5__ seconds. Repeat __10__ times per set. Do 1____ sets per session. Do __3__ sessions per day.  http://orth.exer.us/1086   Copyright  VHI. All rights reserved.  Bridging    Slowly raise buttocks from floor, keeping stomach tight. Repeat _10___ times per set. Do _1___ sets per session. Do __2__ sessions per day.  http://orth.exer.us/1096   Copyright  VHI. All rights reserved.

## 2018-10-01 NOTE — Therapy (Signed)
[place], [place], [address] Phone: [phone]   Fax:  [phone]  Physical Therapy Treatment  Patient Details  Name: Jennifer [name] MRN: [id number] Date of Birth: [birth year] Referring Provider (PT): Phylliss Bob   Encounter Date: 10/01/2018  PT End of Session - 10/01/18 0910    Visit Number  3    Number of Visits  8    Date for PT Re-Evaluation  09/26/18    Authorization Type  [employer]na medicare     PT Start Time  0908    PT Stop Time  0945    PT Time Calculation (min)  37 min    Activity Tolerance  Patient tolerated treatment well    Behavior During Therapy  [facility] for tasks assessed/performed       Past Medical History:  Diagnosis Date  . Depression   . GERD (gastroesophageal reflux disease)   . History of colon polyps    remote past, unable to retrieve records   . Hyperlipidemia   . Hypothyroidism     Past Surgical History:  Procedure Laterality Date  . COLONOSCOPY     last one was about 3 yrs in [place]  . COLONOSCOPY N/A 02/19/2016   Dr. Gala Romney: three 3-6 mm polyps (tubular adenomas) in ascending colon, segmental biopsies consistent with lymphocytic colitis. Prescribed entocort.   . ESOPHAGOGASTRODUODENOSCOPY N/A 08/11/2014   RMR: MIld erosive  reflux esophagitis. small hiatal hernia. Abnormal gastric muocsa uncertain significance. Subtly abnormal duodeanl (bulbar) mucosa- status post multiple biopsies. BENIGN small bowel mucosa, no evidence of villous blulnting, mild reactive gastropathy on stomach biopsy   . GALLBLADDER SURGERY    . LEG SURGERY     right  . PARTIAL HYSTERECTOMY      There were no vitals filed for this visit.  Subjective Assessment - 10/01/18 0907    Subjective  Jennifer [name] has only been seen for her evaluation and one other treatment due to having an exacerbation of her colitis.  She states that she was doing the exercises that she was given and has noticed an improvement.      Pertinent History  chronic diarrhea     How long can you sit comfortably?  able to sit for 60 minutes was 45     How long can you stand comfortably?  able to stand for 30 minutes was 20    How long can you walk comfortably?  able to walk for  10 minutes was 10 feels she could walk more but has been ill and has not been able to     Patient Stated Goals  less pain, walk, stand and sit longer. Sleep better     Currently in Pain?  No/denies   worst pain in the past week has been an 8/10    Pain Orientation  Lower    Pain Descriptors / Indicators  Sharp    Pain Type  Chronic pain    Pain Onset  More than a month ago    Pain Frequency  Intermittent    Aggravating Factors   coughing, turning quickly     Pain Relieving Factors  heat and Ibuprofen     Effect of Pain on Daily Activities  limits          [facility] PT Assessment - 10/01/18 0001      Assessment   Medical Diagnosis  Lumbar strain     Referring Provider (PT)  Phylliss Bob  Onset Date/Surgical Date  06/21/18    Next MD Visit  not scheduled     Prior Therapy  none      Precautions   Precautions  None      Restrictions   Weight Bearing Restrictions  No      Balance Screen   Has the patient fallen in the past 6 months  No    Has the patient had a decrease in activity level because of a fear of falling?   Yes    Is the patient reluctant to leave their home because of a fear of falling?   No      Home Environment   Living Environment  Private residence    Home Access  Level entry      Prior Function   Level of Independence  Independent    Leisure  no hobbies       Cognition   Overall Cognitive Status  Within Functional Limits for tasks assessed      Observation/Other Assessments   Focus on Therapeutic Outcomes (FOTO)   40      Functional Tests   Functional tests  Single leg stance;Sit to Stand      Single Leg Stance   Comments  Lt: 51 was  11";  RT: 60 was  8"       Sit to Stand   Comments  5x in 12.42 was 17.70       Posture/Postural Control   Posture/Postural Control  No significant limitations      AROM   Lumbar Flexion  fingers to floor was 5 " from floor reps improve     Lumbar Extension  30 degrees was 25;  reps improve sx     Lumbar - Right Side Bend  28 was 25 degrees rep improve sx     Lumbar - Left Side Bend  27 was 20 degreesreps increase  sx      Ambulation/Gait   Ambulation Distance (Feet)  690 Feet   was 194   Assistive device  None    Gait Comments  3 minutes                   [facility] Adult PT Treatment/Exercise - 10/01/18 0001      Exercises   Exercises  Lumbar      Lumbar Exercises: Aerobic   Nustep  6 minutes Level 2 hills 3    done at end of session no charge.      Lumbar Exercises: Standing   Scapular Retraction  Strengthening;Both;10 reps;Theraband    Theraband Level (Scapular Retraction)  Level 3 (Green)    Row  Strengthening;Both;10 reps;Theraband    Theraband Level (Row)  Level 3 (Green)    Shoulder Extension  Strengthening;Both;10 reps;Theraband    Theraband Level (Shoulder Extension)  Level 3 (Green)    Other Standing Lumbar Exercises  SLS B     Other Standing Lumbar Exercises  3 D hip excursion x 3       Lumbar Exercises: Supine   Ab Set  10 reps    Bridge  10 reps               PT Short Term Goals - 10/01/18 9924      PT SHORT TERM GOAL #1   Title  PT to be able to sit for 60 minutes without increased pain to be able to eat out     Time  2    Period  Weeks    Status  Achieved      PT SHORT TERM GOAL #2   Title  Pt pain to be no greater than a 5/10 to allow pt to complete 30 minutes of light housework without difficulty.     Time  2    Period  Weeks    Status  On-going      PT SHORT TERM GOAL #3   Title  PT to be able to walk for 20 minutes without increased pain to allow pt to complete short shopping trips.    Time  2    Period  Weeks    Status  Achieved        PT Long Term Goals - 10/01/18 6578      PT LONG TERM  GOAL #1   Title  PT pain to be no greater than a 3/10 to allow pt to ambulate for 30 minutes to allow pt to complete her shopping.     Time  4    Period  Weeks    Status  On-going      PT LONG TERM GOAL #2   Title  PT back and hip ROM to increase to allow pt to be able to pick items off the floor without increased pain.     Time  4    Period  Weeks    Status  Achieved      PT LONG TERM GOAL #3   Title  PT to be able to sleep at least 5 hours throughout the night without waking from back pain.     Time  4    Period  Weeks    Status  Achieved      PT LONG TERM GOAL #4   Title  Pt to be able to single leg stance for 20 seconds bilaterally to allow pt to feel confident walking on uneven ground.     Time  4    Period  Weeks    Status  Achieved      PT LONG TERM GOAL #5   Title  PT to be able to turn without having increased back pain.     Time  3    Status  New    Target Date  10/22/18            Plan - 10/01/18 1214    Clinical Impression Statement  PT reassessed this visit due to pt certification being over.  Pt has only been to therapy three times including the evaluation due to being ill.  She has been doing the HEP given to her at home and feels that it has really improved her back pain.  She is still having some pain and would like to continue skilled therapy.  PT was given an updated HEP.  Therapist feels that the patient will continue to benefit from skilled physical therapy for higher level stabilization exercises to maximize functional ability.     Personal Factors and Comorbidities  Age;Comorbidity 1;Past/Current Experience;Fitness    Oceanographer;Reach Overhead;Lift;Stairs    Examination-Participation Restrictions  Laundry;Cleaning;Yard Work    Rehab Potential  Good    PT Frequency  2x / week    PT Duration  8 weeks   an additional 3 weeks fosr a total of 8 weeks (9 visits)   PT Treatment/Interventions  ADLs/Self Care Home  Management;Therapeutic activities;Therapeutic exercise;Balance training;Patient/family education    PT Next Visit Plan  Add  lifting instructions for both 12"  to waist as well as waist to shoulder level height.  Begin standing stabilization     PT Home Exercise Plan  tall sitting, back extension and Rt SB     Consulted and Agree with Plan of Care  Patient       Patient will benefit from skilled therapeutic intervention in order to improve the following deficits and impairments:  Decreased activity tolerance, Decreased balance, Decreased range of motion, Decreased mobility, Decreased strength, Difficulty walking, Impaired perceived functional ability, Impaired flexibility, Pain  Visit Diagnosis: Acute bilateral low back pain, unspecified whether sciatica present     Problem List Patient Active Problem List   Diagnosis Date Noted  . Abnormal LFTs 04/24/2018  . Dilated cbd, acquired 04/24/2018  . Fatty (change of) liver, not elsewhere classified 10/23/2017  . 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 Chronic diarrhea   . History of colonic polyps 01/28/2016  . Encounter for screening colonoscopy 09/28/2015  . Diarrhea 07/14/2014  . Abdominal pain, epigastric 07/14/2014  . Lower abdominal pain 07/14/2014  . GERD (gastroesophageal reflux disease) 07/14/2014   Rayetta Humphrey, PT CLT [phone] 10/01/2018, 12:22 PM  [facility] [address] [place], [place], [address] Phone: [phone]   Fax:  [phone]  Name: Jennifer [name] MRN: [id number] Date of Birth: [birth year]

## 2018-10-04 ENCOUNTER — Encounter: Payer: Self-pay | Admitting: Gastroenterology

## 2018-10-09 ENCOUNTER — Encounter (HOSPITAL_COMMUNITY): Payer: Self-pay

## 2018-10-09 ENCOUNTER — Ambulatory Visit (HOSPITAL_COMMUNITY): Payer: Medicare HMO

## 2018-10-09 DIAGNOSIS — M545 Low back pain, unspecified: Secondary | ICD-10-CM

## 2018-10-09 NOTE — Patient Instructions (Signed)
FUNCTIONAL MOBILITY: Squat    Stance: shoulder-width on floor. Bend hips and knees. Keep back straight. Do not allow knees to bend past toes. Squeeze glutes and quads to stand. 10 reps per set, 1-2 sets per day, at least 3days per week  Copyright  VHI. All rights reserved.

## 2018-10-09 NOTE — Therapy (Signed)
[place], [place], [address] Phone: [phone]   Fax:  [phone]  Physical Therapy Treatment  Patient Details  Name: Jennifer [name] MRN: [id number] Date of Birth: 1946-06-14 Referring Provider (PT): Phylliss Bob   Encounter Date: 10/09/2018  PT End of Session - 10/09/18 1031    Visit Number  4    Number of Visits  8    Date for PT Re-Evaluation  10/23/18    Authorization Type  [employer]na medicare     Authorization Time Period  03/02-->10/23/18    PT Start Time  1025    PT Stop Time  1112   6' on nustep, not included with charges   PT Time Calculation (min)  47 min    Activity Tolerance  Patient tolerated treatment well    Behavior During Therapy  [facility] for tasks assessed/performed       Past Medical History:  Diagnosis Date  . Depression   . GERD (gastroesophageal reflux disease)   . History of colon polyps    remote past, unable to retrieve records   . Hyperlipidemia   . Hypothyroidism     Past Surgical History:  Procedure Laterality Date  . COLONOSCOPY     last one was about 3 yrs in [place]  . COLONOSCOPY N/A 02/19/2016   Dr. Gala Romney: three 3-6 mm polyps (tubular adenomas) in ascending colon, segmental biopsies consistent with lymphocytic colitis. Prescribed entocort.   . ESOPHAGOGASTRODUODENOSCOPY N/A 08/11/2014   RMR: MIld erosive  reflux esophagitis. small hiatal hernia. Abnormal gastric muocsa uncertain significance. Subtly abnormal duodeanl (bulbar) mucosa- status post multiple biopsies. BENIGN small bowel mucosa, no evidence of villous blulnting, mild reactive gastropathy on stomach biopsy   . GALLBLADDER SURGERY    . LEG SURGERY     right  . PARTIAL HYSTERECTOMY      There were no vitals filed for this visit.  Subjective Assessment - 10/09/18 1023    Subjective  Pt stated she is feeling so much better with therapy, reports compliance with HEP.  Now reports ability touch toes and walk for  prolonged period of time wihtout pain or require any pain medication.      Pertinent History  chronic diarrhea     Patient Stated Goals  less pain, walk, stand and sit longer. Sleep better     Currently in Pain?  No/denies                       [facility] Adult PT Treatment/Exercise - 10/09/18 0001      Lumbar Exercises: Aerobic   Nustep  6 minutes Level 2 hills 3    EOS, not included in charges     Lumbar Exercises: Standing   Functional Squats  10 reps    Lifting  to overhead;10 reps;From 12";From floor    Lifting Weights (lbs)  8#    Lifting Limitations  cueing for mechanics    Forward Lunge  10 reps    Forward Lunge Limitations  BLE    Side Lunge  10 reps    Scapular Retraction  Strengthening;Both;Theraband;15 reps    Theraband Level (Scapular Retraction)  Level 3 (Green)    Row  Strengthening;Both;10 reps;Theraband    Theraband Level (Row)  Level 3 (Green)    Shoulder Extension  Strengthening;Both;15 reps    Theraband Level (Shoulder Extension)  Level 3 (Green)    Shoulder Adduction Limitations  Pallof on foam 4x 15  Other Standing Lumbar Exercises  Lt 56", Rt 60" first attempt; vector stance 5x 5" BLE with intermittent HHA    Other Standing Lumbar Exercises  3 D hip excursion x 3                PT Short Term Goals - 10/01/18 2637      PT SHORT TERM GOAL #1   Title  PT to be able to sit for 60 minutes without increased pain to be able to eat out     Time  2    Period  Weeks    Status  Achieved      PT SHORT TERM GOAL #2   Title  Pt pain to be no greater than a 5/10 to allow pt to complete 30 minutes of light housework without difficulty.     Time  2    Period  Weeks    Status  On-going      PT SHORT TERM GOAL #3   Title  PT to be able to walk for 20 minutes without increased pain to allow pt to complete short shopping trips.    Time  2    Period  Weeks    Status  Achieved        PT Long Term Goals - 10/01/18 8588      PT LONG TERM  GOAL #1   Title  PT pain to be no greater than a 3/10 to allow pt to ambulate for 30 minutes to allow pt to complete her shopping.     Time  4    Period  Weeks    Status  On-going      PT LONG TERM GOAL #2   Title  PT back and hip ROM to increase to allow pt to be able to pick items off the floor without increased pain.     Time  4    Period  Weeks    Status  Achieved      PT LONG TERM GOAL #3   Title  PT to be able to sleep at least 5 hours throughout the night without waking from back pain.     Time  4    Period  Weeks    Status  Achieved      PT LONG TERM GOAL #4   Title  Pt to be able to single leg stance for 20 seconds bilaterally to allow pt to feel confident walking on uneven ground.     Time  4    Period  Weeks    Status  Achieved      PT LONG TERM GOAL #5   Title  PT to be able to turn without having increased back pain.     Time  3    Status  New    Target Date  10/22/18            Plan - 10/09/18 1110    Clinical Impression Statement  Pt educated on mechanics with proper lifting with min assistance and cueing for form.  Pt making great gains with ability to verbalize and demonstrate mechanics, able to self correct.  Progressed stabilization with additiinal SLS and dynamic surface activities with minimal LOB episodes.      Personal Factors and Comorbidities  Age;Comorbidity 1;Past/Current Experience;Fitness    Oceanographer;Reach Overhead;Lift;Stairs    Examination-Participation Restrictions  Laundry;Cleaning;Yard Work    Rehab Potential  Good    PT Frequency  2x /  week    PT Duration  8 weeks   an additional 3 weeks fosr a total of 8 weeks (9 visits)   PT Treatment/Interventions  ADLs/Self Care Home Management;Therapeutic activities;Therapeutic exercise;Balance training;Patient/family education    PT Next Visit Plan  Continue education for lifting, progress standing stabilization.      PT Home Exercise Plan  tall sitting, back  extension and Rt SB; 3/10: squat       Patient will benefit from skilled therapeutic intervention in order to improve the following deficits and impairments:  Decreased activity tolerance, Decreased balance, Decreased range of motion, Decreased mobility, Decreased strength, Difficulty walking, Impaired perceived functional ability, Impaired flexibility, Pain  Visit Diagnosis: Acute bilateral low back pain, unspecified whether sciatica present     Problem List Patient Active Problem List   Diagnosis Date Noted  . Abnormal LFTs 04/24/2018  . Dilated cbd, acquired 04/24/2018  . Fatty (change of) liver, not elsewhere classified 10/23/2017  . 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 Chronic diarrhea   . History of colonic polyps 01/28/2016  . Encounter for screening colonoscopy 09/28/2015  . Diarrhea 07/14/2014  . Abdominal pain, epigastric 07/14/2014  . Lower abdominal pain 07/14/2014  . GERD (gastroesophageal reflux disease) 07/14/2014   [place], [place]; [place]  Aldona Lento 10/09/2018, 11:20 AM  [facility] [place], [place], [address] Phone: [phone]   Fax:  [phone]  Name: KENNETHIA LYNES MRN: [id number] Date of Birth: 1946/05/10

## 2018-10-10 ENCOUNTER — Telehealth: Payer: Self-pay | Admitting: Gastroenterology

## 2018-10-10 NOTE — Telephone Encounter (Signed)
She is due LFTs this month. She had labs last month but LFTs were not done. She will need to have them done before her next ov 10/2018 but prefer her to go in the next 1-2 weeks.

## 2018-10-10 NOTE — Telephone Encounter (Signed)
Spoke with pt. She has lab orders which were given to her. She will have labs done in the next 1-2 weeks.

## 2018-10-11 ENCOUNTER — Ambulatory Visit (HOSPITAL_COMMUNITY): Payer: Medicare HMO | Admitting: Physical Therapy

## 2018-10-11 ENCOUNTER — Other Ambulatory Visit: Payer: Self-pay

## 2018-10-11 ENCOUNTER — Encounter (HOSPITAL_COMMUNITY): Payer: Self-pay | Admitting: Physical Therapy

## 2018-10-11 DIAGNOSIS — M545 Low back pain, unspecified: Secondary | ICD-10-CM

## 2018-10-11 NOTE — Therapy (Signed)
[facility] [address] [place], [place], [address] Phone: [phone]   Fax:  [phone]  Physical Therapy Treatment  Patient Details  Name: Jennifer [name] MRN: [id number] Date of Birth: 1946-05-02 Referring Provider (PT): Phylliss Bob   Encounter Date: 10/11/2018  PT End of Session - 10/11/18 1119    Visit Number  5    Number of Visits  8    Date for PT Re-Evaluation  10/23/18    Authorization Type  [employer]na medicare     Authorization Time Period  03/02-->10/23/18    PT Start Time  1118    PT Stop Time  1200    PT Time Calculation (min)  42 min    Activity Tolerance  Patient tolerated treatment well    Behavior During Therapy  [facility] for tasks assessed/performed       Past Medical History:  Diagnosis Date  . Depression   . GERD (gastroesophageal reflux disease)   . History of colon polyps    remote past, unable to retrieve records   . Hyperlipidemia   . Hypothyroidism     Past Surgical History:  Procedure Laterality Date  . COLONOSCOPY     last one was about 3 yrs in [place]  . COLONOSCOPY N/A 02/19/2016   Dr. Gala Romney: three 3-6 mm polyps (tubular adenomas) in ascending colon, segmental biopsies consistent with lymphocytic colitis. Prescribed entocort.   . ESOPHAGOGASTRODUODENOSCOPY N/A 08/11/2014   RMR: MIld erosive  reflux esophagitis. small hiatal hernia. Abnormal gastric muocsa uncertain significance. Subtly abnormal duodeanl (bulbar) mucosa- status post multiple biopsies. BENIGN small bowel mucosa, no evidence of villous blulnting, mild reactive gastropathy on stomach biopsy   . GALLBLADDER SURGERY    . LEG SURGERY     right  . PARTIAL HYSTERECTOMY      There were no vitals filed for this visit.  Subjective Assessment - 10/11/18 1118    Subjective  Pt states that she is continuing to complete her HEP she is sore from her last treatment..  She continues to feel better.     Pertinent History  chronic diarrhea     How  long can you sit comfortably?  able to sit for 60 minutes was 45     How long can you stand comfortably?  able to stand for 30 minutes was 20    How long can you walk comfortably?  able to walk for  10 minutes was 10 feels she could walk more but has been ill and has not been able to     Patient Stated Goals  less pain, walk, stand and sit longer. Sleep better     Currently in Pain?  No/denies    Pain Onset  More than a month ago               [facility] Adult PT Treatment/Exercise - 10/11/18 0001      Exercises   Exercises  Lumbar      Lumbar Exercises: Stretches   Active Hamstring Stretch  Right;Left;3 reps;30 seconds    Quad Stretch  Right;Left;3 reps;30 seconds    Gastroc Stretch  Right;Left;3 reps;30 seconds    Other Lumbar Stretch Exercise  3 D hip excursion x 3       Lumbar Exercises: Aerobic   Nustep  6 minutes Level 2 hills 3    EOS, not included in charges     Lumbar Exercises: Standing   Functional Squats  10 reps  Functional Squats Limitations  lifting empty box     Lifting  to overhead;10 reps;From 12";From floor    Lifting Weights (lbs)  10    Forward Lunge  10 reps    Scapular Retraction  --   HEP    Row  --   HEP   Shoulder Extension  --   HEP    Shoulder Adduction Limitations  Pallof on foam grn tband x 15       Lumbar Exercises: Quadruped   [place] Horse  5 reps    Opposite Arm/Leg Raise  Right arm/Left leg;Left arm/Right leg;10 reps               PT Short Term Goals - 10/01/18 1660      PT SHORT TERM GOAL #1   Title  PT to be able to sit for 60 minutes without increased pain to be able to eat out     Time  2    Period  Weeks    Status  Achieved      PT SHORT TERM GOAL #2   Title  Pt pain to be no greater than a 5/10 to allow pt to complete 30 minutes of light housework without difficulty.     Time  2    Period  Weeks    Status  On-going      PT SHORT TERM GOAL #3   Title  PT to be able to walk for 20 minutes without  increased pain to allow pt to complete short shopping trips.    Time  2    Period  Weeks    Status  Achieved        PT Long Term Goals - 10/01/18 6301      PT LONG TERM GOAL #1   Title  PT pain to be no greater than a 3/10 to allow pt to ambulate for 30 minutes to allow pt to complete her shopping.     Time  4    Period  Weeks    Status  On-going      PT LONG TERM GOAL #2   Title  PT back and hip ROM to increase to allow pt to be able to pick items off the floor without increased pain.     Time  4    Period  Weeks    Status  Achieved      PT LONG TERM GOAL #3   Title  PT to be able to sleep at least 5 hours throughout the night without waking from back pain.     Time  4    Period  Weeks    Status  Achieved      PT LONG TERM GOAL #4   Title  Pt to be able to single leg stance for 20 seconds bilaterally to allow pt to feel confident walking on uneven ground.     Time  4    Period  Weeks    Status  Achieved      PT LONG TERM GOAL #5   Title  PT to be able to turn without having increased back pain.     Time  3    Status  New    Target Date  10/22/18            Plan - 10/11/18 1158    Personal Factors and Comorbidities  Age;Comorbidity 1;Past/Current Experience;Fitness    Oceanographer;Reach Overhead;Lift;Stairs    Examination-Participation Restrictions  Laundry;Cleaning;Yard Work    Rehab Potential  Good    PT Frequency  2x / week    PT Duration  8 weeks   an additional 3 weeks fosr a total of 8 weeks (9 visits)   PT Treatment/Interventions  ADLs/Self Care Home Management;Therapeutic activities;Therapeutic exercise;Balance training;Patient/family education    PT Next Visit Plan   progress standing stabilization.      PT Home Exercise Plan  tall sitting, back extension and Rt SB; 3/10: squat       Patient will benefit from skilled therapeutic intervention in order to improve the following deficits and impairments:  Decreased activity  tolerance, Decreased balance, Decreased range of motion, Decreased mobility, Decreased strength, Difficulty walking, Impaired perceived functional ability, Impaired flexibility, Pain  Visit Diagnosis: Acute bilateral low back pain, unspecified whether sciatica present     Problem List Patient Active Problem List   Diagnosis Date Noted  . Abnormal LFTs 04/24/2018  . Dilated cbd, acquired 04/24/2018  . Fatty (change of) liver, not elsewhere classified 10/23/2017  . 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 Chronic diarrhea   . History of colonic polyps 01/28/2016  . Encounter for screening colonoscopy 09/28/2015  . Diarrhea 07/14/2014  . Abdominal pain, epigastric 07/14/2014  . Lower abdominal pain 07/14/2014  . GERD (gastroesophageal reflux disease) 07/14/2014    Rayetta Humphrey, PT CLT [phone] 10/11/2018, 11:59 AM  [facility], [place], [address] Phone: [phone]   Fax:  [phone]  Name: Jennifer [name] MRN: [id number] Date of Birth: Nov 17, 1945

## 2018-10-11 NOTE — Patient Instructions (Addendum)
Copyright  VHI. All rights reserved.  Arm / Leg Lift: Opposite (Prone)    Lift right leg and opposite arm _3___ inches from floor, keeping knee locked. Repeat _5-10___ times per set. Do _1___ sets per session. Do ___2_ sessions per day.  http://orth.exer.us/100   Copyright  VHI. All rights reserved.  Angry Cat Stretch    Shortridge chin and tighten stomach, arching back. Now sag like an old horse Repeat __5__ times per set. Do _1___ sets per session. Do __1  http://orth.exer.us/118   Copyright  VHI. All rights reserved.

## 2018-10-16 ENCOUNTER — Encounter (HOSPITAL_COMMUNITY): Payer: Self-pay | Admitting: Physical Therapy

## 2018-10-16 ENCOUNTER — Other Ambulatory Visit: Payer: Self-pay

## 2018-10-16 ENCOUNTER — Ambulatory Visit (HOSPITAL_COMMUNITY): Payer: Medicare HMO | Admitting: Physical Therapy

## 2018-10-16 DIAGNOSIS — M545 Low back pain, unspecified: Secondary | ICD-10-CM

## 2018-10-16 NOTE — Patient Instructions (Addendum)
Self-Mobilization: Knee Flexion (Prone)   With t-band around your legs  Bring left heel toward buttocks as close as possible. Hold __3__ seconds. Relax. Repeat _10__ times per set. Do _1___ sets per session. Do ___2_ sessions per day. Have 2 pillows under your abdomen and make sure you stabilize your back by tightening your stomach and buttock  http://orth.exer.us/596   Copyright  VHI. All rights reserved.

## 2018-10-16 NOTE — Therapy (Signed)
[facility] [address] [place], [place], [address] Phone: [phone]   Fax:  [phone]  Physical Therapy Treatment  Patient Details  Name: Jennifer [name] MRN: [id number] Date of Birth: [birth year] Referring Provider (PT): Phylliss Bob   Encounter Date: 10/16/2018  PT End of Session - 10/16/18 0950    Visit Number  6    Number of Visits  8    Date for PT Re-Evaluation  10/23/18    Authorization Type  [employer] medicare     Authorization Time Period  03/02-->10/23/18    PT Start Time  0945    PT Stop Time  1025    PT Time Calculation (min)  40 min    Activity Tolerance  Patient tolerated treatment well    Behavior During Therapy  [facility] for tasks assessed/performed       Past Medical History:  Diagnosis Date  . Depression   . GERD (gastroesophageal reflux disease)   . History of colon polyps    remote past, unable to retrieve records   . Hyperlipidemia   . Hypothyroidism     Past Surgical History:  Procedure Laterality Date  . COLONOSCOPY     last one was about 3 yrs in [place]  . COLONOSCOPY N/A 02/19/2016   Dr. Gala Romney: three 3-6 mm polyps (tubular adenomas) in ascending colon, segmental biopsies consistent with lymphocytic colitis. Prescribed entocort.   . ESOPHAGOGASTRODUODENOSCOPY N/A 08/11/2014   RMR: MIld erosive  reflux esophagitis. small hiatal hernia. Abnormal gastric muocsa uncertain significance. Subtly abnormal duodeanl (bulbar) mucosa- status post multiple biopsies. BENIGN small bowel mucosa, no evidence of villous blulnting, mild reactive gastropathy on stomach biopsy   . GALLBLADDER SURGERY    . LEG SURGERY     right  . PARTIAL HYSTERECTOMY      There were no vitals filed for this visit.  Subjective Assessment - 10/16/18 0947    Subjective  Pt states that she has not had pain for a week; she is back to doing her normal activities     Pertinent History  chronic diarrhea     How long can you sit comfortably?   able to sit for 60 minutes was 45     How long can you stand comfortably?  able to stand for 30 minutes was 20    How long can you walk comfortably?  able to walk for  10 minutes was 10 feels she could walk more but has been ill and has not been able to     Patient Stated Goals  less pain, walk, stand and sit longer. Sleep better     Pain Onset  More than a month ago         [facility] PT Assessment - 10/16/18 0001      Assessment   Medical Diagnosis  Lumbar strain     Referring Provider (PT)  Phylliss Bob    Onset Date/Surgical Date  06/21/18    Next MD Visit  not scheduled     Prior Therapy  none      Precautions   Precautions  None      Restrictions   Weight Bearing Restrictions  No      Balance Screen   Has the patient fallen in the past 6 months  No    Has the patient had a decrease in activity level because of a fear of falling?   No    Is the patient reluctant to leave their  home because of a fear of falling?   No      Home Environment   Living Environment  Private residence    Home Access  Level entry      Prior Function   Level of Independence  Independent    Leisure  no hobbies       Cognition   Overall Cognitive Status  Within Functional Limits for tasks assessed      Observation/Other Assessments   Focus on Therapeutic Outcomes (FOTO)   87   was 40      Functional Tests   Functional tests  Single leg stance;Sit to Stand      Single Leg Stance   Comments  Lt: 60 was  11";  RT: 60 was  8"       Sit to Stand   Comments  5x in 12.42 was 17.70      Posture/Postural Control   Posture/Postural Control  No significant limitations      AROM   Lumbar Flexion  fingers to floor was 5 " from floor reps improve     Lumbar Extension  30 degrees was 25;  reps improve sx     Lumbar - Right Side Bend  30 was 25 degrees rep improve sx     Lumbar - Left Side Bend  30 was 20 degreesreps increase  sx      Strength   Strength Assessment Site  Hip;Knee;Ankle     Right/Left Hip  Right;Left    Right Hip Flexion  5/5    Right Hip Extension  3+/5    Left Hip Flexion  4+/5    Left Hip Extension  3+/5    Left Hip ABduction  4+/5    Right/Left Knee  Right;Left    Right Knee Flexion  4/5    Right Knee Extension  5/5    Left Knee Flexion  4/5    Left Knee Extension  5/5    Right/Left Ankle  Right;Left    Right Ankle Dorsiflexion  5/5    Left Ankle Dorsiflexion  5/5      Ambulation/Gait   Ambulation Distance (Feet)  690 Feet   was 194   Assistive device  None    Gait Comments  3 minutes                   [facility] Adult PT Treatment/Exercise - 10/16/18 0001      Exercises   Exercises  Lumbar      Lumbar Exercises: Aerobic   Nustep  8 minutes hills Level 3       Lumbar Exercises: Supine   Bridge  10 reps    Bridge Limitations  with green tband     Straight Leg Raise  10 reps      Lumbar Exercises: Prone   Other Prone Lumbar Exercises  knee flexion B witn green tband       Lumbar Exercises: Quadruped   Opposite Arm/Leg Raise  Right arm/Left leg;Left arm/Right leg;10 reps               PT Short Term Goals - 10/01/18 7412      PT SHORT TERM GOAL #1   Title  PT to be able to sit for 60 minutes without increased pain to be able to eat out     Time  2    Period  Weeks    Status  Achieved      PT SHORT TERM  GOAL #2   Title  Pt pain to be no greater than a 5/10 to allow pt to complete 30 minutes of light housework without difficulty.     Time  2    Period  Weeks    Status  On-going      PT SHORT TERM GOAL #3   Title  PT to be able to walk for 20 minutes without increased pain to allow pt to complete short shopping trips.    Time  2    Period  Weeks    Status  Achieved        PT Long Term Goals - 10/01/18 7494      PT LONG TERM GOAL #1   Title  PT pain to be no greater than a 3/10 to allow pt to ambulate for 30 minutes to allow pt to complete her shopping.     Time  4    Period  Weeks    Status  On-going       PT LONG TERM GOAL #2   Title  PT back and hip ROM to increase to allow pt to be able to pick items off the floor without increased pain.     Time  4    Period  Weeks    Status  Achieved      PT LONG TERM GOAL #3   Title  PT to be able to sleep at least 5 hours throughout the night without waking from back pain.     Time  4    Period  Weeks    Status  Achieved      PT LONG TERM GOAL #4   Title  Pt to be able to single leg stance for 20 seconds bilaterally to allow pt to feel confident walking on uneven ground.     Time  4    Period  Weeks    Status  Achieved      PT LONG TERM GOAL #5   Title  PT to be able to turn without having increased back pain.     Time  3    Status  New    Target Date  10/22/18            Plan - 10/16/18 1026    Clinical Impression Statement  Pt reassessed as she has not had any pain for a week.  Noted weakness in hamstrengs and glut maximus.  Pt given new exercises to compete at home to address this.  Will see at least one more visit to ensure pt is completing HEP correctly.     Personal Factors and Comorbidities  Age;Comorbidity 1;Past/Current Experience;Fitness    Oceanographer;Reach Overhead;Lift;Stairs    Examination-Participation Restrictions  Laundry;Cleaning;Yard Work    Rehab Potential  Good    PT Frequency  2x / week    PT Duration  8 weeks   an additional 3 weeks fosr a total of 8 weeks (9 visits)   PT Treatment/Interventions  ADLs/Self Care Home Management;Therapeutic activities;Therapeutic exercise;Balance training;Patient/family education    PT Next Visit Plan  Add hip abduction and extension with tband.     PT Home Exercise Plan  tall sitting, back extension and Rt SB; 3/10: squat; 3/12:  mad cat/old horse/ prone opposite arm/leg raise        Patient will benefit from skilled therapeutic intervention in order to improve the following deficits and impairments:  Decreased activity tolerance, Decreased  balance, Decreased range of motion,  Decreased mobility, Decreased strength, Difficulty walking, Impaired perceived functional ability, Impaired flexibility, Pain  Visit Diagnosis: Acute bilateral low back pain, unspecified whether sciatica present     Problem List Patient Active Problem List   Diagnosis Date Noted  . Abnormal LFTs 04/24/2018  . Dilated cbd, acquired 04/24/2018  . Fatty (change of) liver, not elsewhere classified 10/23/2017  . 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 Chronic diarrhea   . History of colonic polyps 01/28/2016  . Encounter for screening colonoscopy 09/28/2015  . Diarrhea 07/14/2014  . Abdominal pain, epigastric 07/14/2014  . Lower abdominal pain 07/14/2014  . GERD (gastroesophageal reflux disease) 07/14/2014    Rayetta Humphrey, PT CLT [phone] 10/16/2018, 12:24 PM  [facility] [address] [place], [place], [address] Phone: [phone]   Fax:  [phone]  Name: Jennifer [name] MRN: [id number] Date of Birth: Dec 09, 1945

## 2018-10-18 ENCOUNTER — Encounter (HOSPITAL_COMMUNITY): Payer: Self-pay | Admitting: Physical Therapy

## 2018-10-18 ENCOUNTER — Other Ambulatory Visit: Payer: Self-pay

## 2018-10-18 ENCOUNTER — Ambulatory Visit (HOSPITAL_COMMUNITY): Payer: Medicare HMO | Admitting: Physical Therapy

## 2018-10-18 DIAGNOSIS — M545 Low back pain, unspecified: Secondary | ICD-10-CM

## 2018-10-18 NOTE — Therapy (Signed)
[facility] [address] [place], [place], [address] Phone: [phone]   Fax:  [phone]  Physical Therapy Treatment  Patient Details  Name: Jennifer [name] MRN: [id number] Date of Birth: 07-Apr-1946 Referring Provider (PT): Phylliss Bob   Encounter Date: 10/18/2018  PT End of Session - 10/18/18 1033    Visit Number  7    Number of Visits  8    Date for PT Re-Evaluation  10/23/18    Authorization Type  A[employer]na medicare     Authorization Time Period  03/02-->10/23/18    PT Start Time  0950    PT Stop Time  1030    PT Time Calculation (min)  40 min    Activity Tolerance  Patient tolerated treatment well    Behavior During Therapy  [facility] for tasks assessed/performed       Past Medical History:  Diagnosis Date  . Depression   . GERD (gastroesophageal reflux disease)   . History of colon polyps    remote past, unable to retrieve records   . Hyperlipidemia   . Hypothyroidism     Past Surgical History:  Procedure Laterality Date  . COLONOSCOPY     last one was about 3 yrs in [place]  . COLONOSCOPY N/A 02/19/2016   Dr. Gala Romney: three 3-6 mm polyps (tubular adenomas) in ascending colon, segmental biopsies consistent with lymphocytic colitis. Prescribed entocort.   . ESOPHAGOGASTRODUODENOSCOPY N/A 08/11/2014   RMR: MIld erosive  reflux esophagitis. small hiatal hernia. Abnormal gastric muocsa uncertain significance. Subtly abnormal duodeanl (bulbar) mucosa- status post multiple biopsies. BENIGN small bowel mucosa, no evidence of villous blulnting, mild reactive gastropathy on stomach biopsy   . GALLBLADDER SURGERY    . LEG SURGERY     right  . PARTIAL HYSTERECTOMY      There were no vitals filed for this visit.  Subjective Assessment - 10/18/18 0953    Subjective  Pt states that she did all her housework without pain.    Pertinent History  chronic diarrhea     How long can you sit comfortably?  able to sit for 60 minutes was 45      How long can you stand comfortably?  able to stand for 30 minutes was 20    How long can you walk comfortably?  able to walk for  10 minutes was 10 feels she could walk more but has been ill and has not been able to     Patient Stated Goals  less pain, walk, stand and sit longer. Sleep better     Currently in Pain?  No/denies    Pain Onset  More than a month ago                [facility] Adult PT Treatment/Exercise - 10/18/18 0001      Exercises   Exercises  Lumbar      Lumbar Exercises: Stretches   Passive Hamstring Stretch  Right;Left;3 reps;30 seconds    Passive Hamstring Stretch Limitations  12" step       Lumbar Exercises: Aerobic   Nustep  --      Lumbar Exercises: Machines for Strengthening   Leg Press  4 Pl x 15       Lumbar Exercises: Standing   Shoulder Adduction Limitations  [place] on foam with green tband x 15     Other Standing Lumbar Exercises  side step w/greent-band x 2 reps; hip ab/ext B x 10 with green t-band  Lumbar Exercises: Seated   Long Arc Quad on [place]  10 reps    Sit to Stand  10 reps               PT Short Term Goals - 10/01/18 9702      PT SHORT TERM GOAL #1   Title  PT to be able to sit for 60 minutes without increased pain to be able to eat out     Time  2    Period  Weeks    Status  Achieved      PT SHORT TERM GOAL #2   Title  Pt pain to be no greater than a 5/10 to allow pt to complete 30 minutes of light housework without difficulty.     Time  2    Period  Weeks    Status  On-going      PT SHORT TERM GOAL #3   Title  PT to be able to walk for 20 minutes without increased pain to allow pt to complete short shopping trips.    Time  2    Period  Weeks    Status  Achieved        PT Long Term Goals - 10/01/18 6378      PT LONG TERM GOAL #1   Title  PT pain to be no greater than a 3/10 to allow pt to ambulate for 30 minutes to allow pt to complete her shopping.     Time  4    Period  Weeks    Status  On-going       PT LONG TERM GOAL #2   Title  PT back and hip ROM to increase to allow pt to be able to pick items off the floor without increased pain.     Time  4    Period  Weeks    Status  Achieved      PT LONG TERM GOAL #3   Title  PT to be able to sleep at least 5 hours throughout the night without waking from back pain.     Time  4    Period  Weeks    Status  Achieved      PT LONG TERM GOAL #4   Title  Pt to be able to single leg stance for 20 seconds bilaterally to allow pt to feel confident walking on uneven ground.     Time  4    Period  Weeks    Status  Achieved      PT LONG TERM GOAL #5   Title  PT to be able to turn without having increased back pain.     Time  3    Status  New    Target Date  10/22/18            Plan - 10/18/18 1034    Clinical Impression Statement  Added standing t-band for hip abduction/extension strengthening; side stepping with t-band and ball LAQ with verbal cuing needed for proper technique.      Personal Factors and Comorbidities  Age;Comorbidity 1;Past/Current Experience;Fitness    Oceanographer;Reach Overhead;Lift;Stairs    Examination-Participation Restrictions  Laundry;Cleaning;Yard Work    Rehab Potential  Good    PT Frequency  2x / week    PT Duration  8 weeks   an additional 3 weeks fosr a total of 8 weeks (9 visits)   PT Treatment/Interventions  ADLs/Self Care Home Management;Therapeutic activities;Therapeutic exercise;Balance training;Patient/family  education    PT Next Visit Plan  May use mm testing from 2 visits ago for discharge, review exercises for HEP, complete FOTO, encourage to go to [facility] and discharge.     PT Home Exercise Plan  tall sitting, back extension and Rt SB; 3/10: squat; 3/12:  mad cat/old horse/ prone opposite arm/leg raise        Patient will benefit from skilled therapeutic intervention in order to improve the following deficits and impairments:  Decreased activity tolerance,  Decreased balance, Decreased range of motion, Decreased mobility, Decreased strength, Difficulty walking, Impaired perceived functional ability, Impaired flexibility, Pain  Visit Diagnosis: Acute bilateral low back pain, unspecified whether sciatica present     Problem List Patient Active Problem List   Diagnosis Date Noted  . Abnormal LFTs 04/24/2018  . Dilated cbd, acquired 04/24/2018  . Fatty (change of) liver, not elsewhere classified 10/23/2017  . 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 Chronic diarrhea   . History of colonic polyps 01/28/2016  . Encounter for screening colonoscopy 09/28/2015  . Diarrhea 07/14/2014  . Abdominal pain, epigastric 07/14/2014  . Lower abdominal pain 07/14/2014  . GERD (gastroesophageal reflux disease) 07/14/2014    Rayetta Humphrey, PT CLT [phone] 10/18/2018, 10:36 AM  [facility] [place], [place], [address] Phone: [phone]   Fax:  [phone]  Name: Jennifer [name] MRN: [id number] Date of Birth: [birth year]

## 2018-10-19 ENCOUNTER — Other Ambulatory Visit: Payer: Self-pay | Admitting: Gastroenterology

## 2018-10-19 ENCOUNTER — Telehealth (HOSPITAL_COMMUNITY): Payer: Self-pay

## 2018-10-19 NOTE — Telephone Encounter (Signed)
Pt called back and reports she feels ready for DC.  Reports ability to complete all household cleaning without pain or difficulty.  Reports she continues with HEP.  Interested in paperwork for [facility].  [address], [place]; [employer] [phone]

## 2018-10-19 NOTE — Telephone Encounter (Signed)
Called and left message concerning OPPT dept to close for 2 weeks minimal due to COVID-19 and trying to reduce spread of virus.  If pt does call back would like to discuss how she feels about DC and possibly ask her FOTO questions over phone.  [address], [place]; [employer] [phone]

## 2018-10-22 ENCOUNTER — Encounter (HOSPITAL_COMMUNITY): Payer: Self-pay | Admitting: Physical Therapy

## 2018-10-22 DIAGNOSIS — R69 Illness, unspecified: Secondary | ICD-10-CM | POA: Diagnosis not present

## 2018-10-22 DIAGNOSIS — K210 Gastro-esophageal reflux disease with esophagitis: Secondary | ICD-10-CM | POA: Diagnosis not present

## 2018-10-22 NOTE — Therapy (Unsigned)
[facility] [place], [place], [address] Phone: [phone]   Fax:  [phone]  Patient Details  Name: Jennifer [name] MRN: [id number] Date of Birth: [birth year] Referring Provider:  Phylliss Bob  Encounter Date: 10/22/2018   PHYSICAL THERAPY DISCHARGE SUMMARY  Visits from Start of Care: 7  Current functional level related to goals / functional outcomes: Pt is no longer having pain.  Able to complete all house work    Remaining deficits: See above    Education / Equipment: HEP Plan: Patient agrees to discharge.  Patient goals were met. Patient is being discharged due to meeting the stated rehab goals.  ?????      Rayetta Humphrey, PT CLT [phone] 10/22/2018, 1:47 PM  [facility] [address] [place], [place], [address] Phone: [phone]   Fax:  [phone]

## 2018-10-23 ENCOUNTER — Ambulatory Visit (HOSPITAL_COMMUNITY): Payer: Medicare HMO

## 2018-10-24 ENCOUNTER — Telehealth: Payer: Self-pay | Admitting: General Practice

## 2018-10-24 NOTE — Telephone Encounter (Signed)
I received a call from the pts pcp(Dr. Nevada Crane) stating the patient is having some diarrhea.  I made her aware that [facility] has been trying to reach her concerning results.  She stated she will call the patient and tell her to give [place] a call.

## 2018-10-24 NOTE — Telephone Encounter (Signed)
Noted. Pt called and did receive the letter from [place]. Pt hasn't had her lab work done and plans on having it tomorrow.

## 2018-10-25 ENCOUNTER — Ambulatory Visit (HOSPITAL_COMMUNITY): Payer: Medicare HMO

## 2018-10-26 DIAGNOSIS — R748 Abnormal levels of other serum enzymes: Secondary | ICD-10-CM | POA: Diagnosis not present

## 2018-10-26 LAB — HEPATIC FUNCTION PANEL
AG Ratio: 1.8 (calc) (ref 1.0–2.5)
ALT: 19 U/L (ref 6–29)
AST: 22 U/L (ref 10–35)
Albumin: 3.7 g/dL (ref 3.6–5.1)
Alkaline phosphatase (APISO): 70 U/L (ref 37–153)
Bilirubin, Direct: 0.1 mg/dL (ref 0.0–0.2)
Globulin: 2.1 g/dL (calc) (ref 1.9–3.7)
Indirect Bilirubin: 0.4 mg/dL (calc) (ref 0.2–1.2)
TOTAL PROTEIN: 5.8 g/dL — AB (ref 6.1–8.1)
Total Bilirubin: 0.5 mg/dL (ref 0.2–1.2)

## 2018-10-29 ENCOUNTER — Encounter (HOSPITAL_COMMUNITY): Payer: Medicare HMO | Admitting: Physical Therapy

## 2018-11-19 DIAGNOSIS — M545 Low back pain: Secondary | ICD-10-CM | POA: Diagnosis not present

## 2018-11-21 ENCOUNTER — Ambulatory Visit (INDEPENDENT_AMBULATORY_CARE_PROVIDER_SITE_OTHER): Payer: Medicare HMO | Admitting: Nurse Practitioner

## 2018-11-21 ENCOUNTER — Other Ambulatory Visit: Payer: Self-pay

## 2018-11-21 ENCOUNTER — Encounter: Payer: Self-pay | Admitting: Internal Medicine

## 2018-11-21 ENCOUNTER — Encounter: Payer: Self-pay | Admitting: Nurse Practitioner

## 2018-11-21 DIAGNOSIS — R1030 Lower abdominal pain, unspecified: Secondary | ICD-10-CM | POA: Diagnosis not present

## 2018-11-21 DIAGNOSIS — K529 Noninfective gastroenteritis and colitis, unspecified: Secondary | ICD-10-CM | POA: Diagnosis not present

## 2018-11-21 DIAGNOSIS — K52832 Lymphocytic colitis: Secondary | ICD-10-CM

## 2018-11-21 NOTE — Progress Notes (Signed)
Referring Provider: Celene Squibb, MD Primary Care Physician:  Celene Squibb, MD Primary GI:  Dr. Gala Romney  NOTE: Service was provided via telemedicine and was requested by the patient due to COVID-19 pandemic.   Method of visit: Telephone (failed Zoom)  Patient Location: Home  Provider Location: Office  Reason for Phone Visit: Follow-up  The patient was consented to phone follow-up via telephone encounter including billing of the encounter (yes/no): Yes  Persons present on the phone encounter, with roles: Husband  Total time (minutes) spent on medical discussion: 23 minutes  Chief Complaint  Patient presents with   Diarrhea    HPI:   Jennifer [name] is a 73 y.o. female who presents for virtual visit regarding: Follow-up on diarrhea and dilated common bile duct.  The patient was last seen in our office 08/28/2018 for diarrhea.  Known history of diarrhea, abdominal pain with known lymphocytic colitis diagnosed in 2017 treated with Entocort previously.  Noted plans of surveillance colonoscopy later in 2020 due to history of adenomatous colon polyps.  At the time of her last visit she was having 2 weeks of worsening diarrhea 5 day/day, inflamed hemorrhoids, bright red blood per rectum felt due to hemorrhoids.  Diarrhea had recently improved but then she took an antibiotic and stools worsened.  Preparation H helps.  Also noted history of CBD evaluated at time of MRI in 09/2017 question postsurgical measuring up to 18 mm and tapering at the ampulla.  LFTs normal 4 months ago although previously they were elevated.  Acute on chronic diarrhea with history of lymphocytic colitis and recent antibiotics [place] query C. difficile.  If normal stool studies consider Entocort.  Recommended stool studies, labs, adequate hydration.  Labs completed 09/11/2018 which found negative C. difficile, normal CBC, essentially normal HFP.  Recommended Entocort 9 mg for 30 days and 3 refills.  Recommended 6 to 8-week  follow-up.  Today she states she's doing good overall. She's taking Entocort now, and diarrhea has improved but not resolved. Has diarrhea rarely, only twice in the past couple months. Has been able to finish therapy and this has helped. Has had abdominal pain 3 times in the past week associated with her few episodes of diarrhea. Denies N/V, hematochezia, melena. Hemorrhoids are much better with less frequent stools; Preparation H didn't help. Denies fever, chills, unintentional weight loss. Denies URI and flu-like symptoms. Denies chest pain, dyspnea, dizziness, lightheadedness, syncope, near syncope. Denies any other upper or lower GI symptoms.  Cost of Entocort isn't bad.  Past Medical History:  Diagnosis Date   Depression    GERD (gastroesophageal reflux disease)    History of colon polyps    remote past, unable to retrieve records    Hyperlipidemia    Hypothyroidism     Past Surgical History:  Procedure Laterality Date   COLONOSCOPY     last one was about 3 yrs in [facility]   COLONOSCOPY N/A 02/19/2016   Dr. Gala Romney: three 3-6 mm polyps (tubular adenomas) in ascending colon, segmental biopsies consistent with lymphocytic colitis. Prescribed entocort.    ESOPHAGOGASTRODUODENOSCOPY N/A 08/11/2014   RMR: MIld erosive  reflux esophagitis. small hiatal hernia. Abnormal gastric muocsa uncertain significance. Subtly abnormal duodeanl (bulbar) mucosa- status post multiple biopsies. BENIGN small bowel mucosa, no evidence of villous blulnting, mild reactive gastropathy on stomach biopsy    GALLBLADDER SURGERY     LEG SURGERY     right   PARTIAL HYSTERECTOMY      Current Outpatient  Medications  Medication Sig Dispense Refill   albuterol (VENTOLIN HFA) 108 (90 Base) MCG/ACT inhaler Inhale 2 puffs into the lungs every 6 (six) hours as needed for wheezing or shortness of breath.     ANORO ELLIPTA 62.5-25 MCG/INH AEPB Inhale 1 puff into the lungs daily.     budesonide (ENTOCORT  EC) 3 MG 24 hr capsule TAKE 3 CAPSULES BY MOUTH ONCE DAILY. 90 capsule 2   Dextromethorphan-guaiFENesin (MUCINEX DM MAXIMUM STRENGTH) 60-1200 MG TB12 Take by mouth as needed.     diphenhydrAMINE HCl (EQ ALLERGY RELIEF PO) Take by mouth as needed.     fluticasone (FLONASE) 50 MCG/ACT nasal spray Place 2 sprays into both nostrils 2 (two) times daily as needed for allergies.   1   levothyroxine (SYNTHROID, LEVOTHROID) 100 MCG tablet Take 100 mcg by mouth daily before breakfast.     magnesium gluconate (MAGONATE) 500 MG tablet Take 1,000 mg by mouth daily.     methocarbamol (ROBAXIN) 500 MG tablet Take 500 mg by mouth every 6 (six) hours as needed for muscle spasms.     mirabegron ER (MYRBETRIQ) 25 MG TB24 tablet Take 25 mg by mouth at bedtime.     pantoprazole (PROTONIX) 40 MG tablet Take 40 mg by mouth daily.     simvastatin (ZOCOR) 20 MG tablet Take 40 mg by mouth daily at 6 PM.      tiZANidine (ZANAFLEX) 2 MG tablet Take by mouth every 8 (eight) hours as needed for muscle spasms.     No current facility-administered medications for this visit.     Allergies as of 11/21/2018 - Review Complete 11/21/2018  Allergen Reaction Noted   Desyrel [trazodone]  07/14/2014   Keflex [cephalexin]  07/14/2014   Klonopin [clonazepam]  07/14/2014   Minocin [minocycline hcl]  07/14/2014   Stelazine [trifluoperazine]  07/14/2014   Viberzi [eluxadoline]  03/31/2015    Family History  Problem Relation Age of Onset   Colon cancer Neg Hx     Social History   Socioeconomic History   Marital status: Married    Spouse name: Not on file   Number of children: 2   Years of education: Not on file   Highest education level: Not on file  Occupational History   Occupation: disability  Social Designer, fashion/clothing strain: Not on file   Food insecurity:    Worry: Not on file    Inability: Not on file   Transportation needs:    Medical: Not on file    Non-medical: Not on file    Tobacco Use   Smoking status: Former Smoker    Packs/day: 0.50    Types: Cigarettes    Last attempt to quit: 03/31/2018    Years since quitting: 0.6   Smokeless tobacco: Never Used   Tobacco comment: quit 03/31/18  Substance and Sexual Activity   Alcohol use: No    Alcohol/week: 0.0 standard drinks   Drug use: No   Sexual activity: Not on file  Lifestyle   Physical activity:    Days per week: Not on file    Minutes per session: Not on file   Stress: Not on file  Relationships   Social connections:    Talks on phone: Not on file    Gets together: Not on file    Attends religious service: Not on file    Active member of club or organization: Not on file    Attends meetings of clubs or organizations: Not on  file    Relationship status: Not on file  Other Topics Concern   Not on file  Social History Narrative   Not on file    Review of Systems: General: Negative for anorexia, weight loss, fever, chills, fatigue, weakness. Eyes: Negative for vision changes.  ENT: Negative for hoarseness, difficulty swallowing. CV: Negative for chest pain, angina, palpitations, peripheral edema.  Respiratory: Negative for dyspnea at rest, cough, sputum, wheezing.  GI: See history of present illness. Endo: Negative for unusual weight change.  Heme: Negative for bruising or bleeding. Allergy: Negative for rash or hives.  Physical Exam: Note: limited exam due to virtual visit General:   Alert and oriented. Pleasant and cooperative. Ears:  Normal auditory acuity. Skin:  Intact without facial significant lesions or rashes. Neurologic:  Alert and oriented x4 Psych:  Alert and cooperative. Normal mood and affect. Heme/Lymph/Immune: No excessive bruising noted.

## 2018-11-21 NOTE — Patient Instructions (Signed)
Your health issues we discussed today were:   Diarrhea: 1. Finish taking Entocort (budesonide) until you have finished 2. Call us 1 week after finishing your medication and let us know how you are doing 3. Call if you have any severe worsening of your symptoms  Abdominal pain associated with diarrhea: 1. He may have irritable bowel syndrome in addition. 2. Depending on how you do off Entocort we can consider alternative treatment options as well 3. Call for any severe or worsening symptoms  Overall I recommend:  1. Continue your other medications 2. Call us if you have any questions or concerns 3. Follow-up in 2 months.   Because of recent events of COVID-19 ("Coronavirus"), follow CDC recommendations:  1. Wash your hand frequently 2. Avoid touching your face 3. Stay away from people who are sick 4. If you have symptoms such as fever, cough, shortness of breath then call your healthcare provider for further guidance 5. If you are sick, STAY AT HOME unless otherwise directed by your healthcare provider. 6. Follow directions from state and national officials regarding staying safe   At [facility] we value your feedback. You may receive a survey about your visit today. Please share your experience as we strive to create trusting relationships with our patients to provide genuine, compassionate, quality care.  We appreciate your understanding and patience as we review any laboratory studies, imaging, and other diagnostic tests that are ordered as we care for you. Our office policy is 5 business days for review of these results, and any emergent or urgent results are addressed in a timely manner for your best interest. If you do not hear from our office in 1 week, please contact us.   We also encourage the use of MyChart, which contains your medical information for your review as well. If you are not enrolled in this feature, an access code is on this after visit summary  for your convenience. Thank you for allowing [place] to be involved in your care.  It was great to see you today!  I hope you have a great day!!

## 2018-11-23 ENCOUNTER — Encounter: Payer: Self-pay | Admitting: Nurse Practitioner

## 2018-11-23 NOTE — Assessment & Plan Note (Signed)
Chronic diarrhea.  Has been having lower abdominal pain associated with her diarrhea which resolves after a bowel movement.  Acute phase of lymphocytic colitis seems to be resolving/resolved on Entocort.  She does still have some Entocort left him she will call us with a progress report 1 week after.  We can consider treatment of IBS D depending on her symptom progression/presentation after completion of Entocort.  If she does relapse and have what is felt to be recurrent lymphocytic colitis we can consider restarting Entocort with a protracted taper.  Follow-up in 2 months.

## 2018-11-23 NOTE — Assessment & Plan Note (Signed)
Acute diarrhea seems to be significantly improved in the setting of lymphocytic colitis on top of chronic diarrhea.  Recommend she finish Entocort as per above.  Depending on how her chronic diarrhea is after completion of Entocort we can consider either further treatment with Entocort if it is felt to be lymphocytic colitis not resolved, or reconsider treatment of IVSD given her symptom presentation with lower abdominal crampy pain and diarrhea with pain resolving after bowel movement.  She is going to call us with a progress report 1 week after finishing her Entocort to see how her stools are doing.  We will see her back in 2 months to further evaluate.

## 2018-11-23 NOTE — Assessment & Plan Note (Signed)
Known history of chronic diarrhea.  Previously had acute on chronic diarrhea and was diagnosed with lymphocytic colitis and started on Entocort.  Her diarrhea has improved on Entocort, but not resolved.  This is not unexpected given the chronic nature of her baseline diarrhea.  However, she is only having diarrhea rarely typically twice in the past couple months.  She notes the cost of Entocort is not bad, should she require further treatment.  She does still have some Entocort left.  Recommend she finish her Entocort and call with a progress report 1 week after finishing to discern if her diarrhea stays improved.  Overall, return for follow-up in 2 months.

## 2018-11-26 NOTE — Progress Notes (Signed)
cc'ed to pcp °

## 2018-11-30 ENCOUNTER — Other Ambulatory Visit: Payer: Self-pay | Admitting: Gastroenterology

## 2018-12-05 ENCOUNTER — Telehealth: Payer: Self-pay | Admitting: Internal Medicine

## 2018-12-05 NOTE — Telephone Encounter (Signed)
Pt said that she was calling to give a report on her medication (Budesonide) She finished taking it last Wednesday and has diarrhea again with a bleeding hemorrhoid. She said she had a refill and took one this morning. Please advise and call her at [phone]

## 2018-12-05 NOTE — Telephone Encounter (Signed)
Diarrhea started back the day after pt finished Budesonide. Pt noticed a small amount of blood when she wiped s/p a bowel movement. Pt thinks its her hemorrhoid. C/o mild upper abdominal pain. Pt was out of Budesonide starting last Thursday, pt restarted her refill today.

## 2018-12-12 DIAGNOSIS — M79672 Pain in left foot: Secondary | ICD-10-CM | POA: Diagnosis not present

## 2018-12-12 DIAGNOSIS — M25579 Pain in unspecified ankle and joints of unspecified foot: Secondary | ICD-10-CM | POA: Diagnosis not present

## 2018-12-12 DIAGNOSIS — S93331A Other subluxation of right foot, initial encounter: Secondary | ICD-10-CM | POA: Diagnosis not present

## 2018-12-13 NOTE — Telephone Encounter (Signed)
Can we call and check on her symptoms now that she's back on Budesonide?

## 2018-12-18 NOTE — Telephone Encounter (Signed)
Spoke with pt. She is still having a few bowel movements a day but it isn't as loose as it was since she's taking the Entocort.

## 2018-12-20 DIAGNOSIS — Z0000 Encounter for general adult medical examination without abnormal findings: Secondary | ICD-10-CM | POA: Diagnosis not present

## 2018-12-20 NOTE — Telephone Encounter (Signed)
Excellent. She can call if any worsening.

## 2018-12-28 DIAGNOSIS — L03314 Cellulitis of groin: Secondary | ICD-10-CM | POA: Diagnosis not present

## 2018-12-28 DIAGNOSIS — L299 Pruritus, unspecified: Secondary | ICD-10-CM | POA: Diagnosis not present

## 2019-01-02 DIAGNOSIS — L309 Dermatitis, unspecified: Secondary | ICD-10-CM | POA: Diagnosis not present

## 2019-01-02 DIAGNOSIS — L298 Other pruritus: Secondary | ICD-10-CM | POA: Diagnosis not present

## 2019-01-02 DIAGNOSIS — S20461A Insect bite (nonvenomous) of right back wall of thorax, initial encounter: Secondary | ICD-10-CM | POA: Diagnosis not present

## 2019-01-16 DIAGNOSIS — L299 Pruritus, unspecified: Secondary | ICD-10-CM | POA: Diagnosis not present

## 2019-01-16 DIAGNOSIS — S161XXA Strain of muscle, fascia and tendon at neck level, initial encounter: Secondary | ICD-10-CM | POA: Diagnosis not present

## 2019-01-16 DIAGNOSIS — M545 Low back pain: Secondary | ICD-10-CM | POA: Diagnosis not present

## 2019-01-16 DIAGNOSIS — R69 Illness, unspecified: Secondary | ICD-10-CM | POA: Diagnosis not present

## 2019-01-16 DIAGNOSIS — K210 Gastro-esophageal reflux disease with esophagitis: Secondary | ICD-10-CM | POA: Diagnosis not present

## 2019-01-16 DIAGNOSIS — L03314 Cellulitis of groin: Secondary | ICD-10-CM | POA: Diagnosis not present

## 2019-01-16 DIAGNOSIS — E559 Vitamin D deficiency, unspecified: Secondary | ICD-10-CM | POA: Diagnosis not present

## 2019-01-16 DIAGNOSIS — H65199 Other acute nonsuppurative otitis media, unspecified ear: Secondary | ICD-10-CM | POA: Diagnosis not present

## 2019-01-16 DIAGNOSIS — S46012A Strain of muscle(s) and tendon(s) of the rotator cuff of left shoulder, initial encounter: Secondary | ICD-10-CM | POA: Diagnosis not present

## 2019-01-23 DIAGNOSIS — E039 Hypothyroidism, unspecified: Secondary | ICD-10-CM | POA: Diagnosis not present

## 2019-01-23 DIAGNOSIS — E782 Mixed hyperlipidemia: Secondary | ICD-10-CM | POA: Diagnosis not present

## 2019-01-23 DIAGNOSIS — R945 Abnormal results of liver function studies: Secondary | ICD-10-CM | POA: Diagnosis not present

## 2019-01-23 DIAGNOSIS — R600 Localized edema: Secondary | ICD-10-CM | POA: Diagnosis not present

## 2019-01-23 DIAGNOSIS — L299 Pruritus, unspecified: Secondary | ICD-10-CM | POA: Diagnosis not present

## 2019-01-23 DIAGNOSIS — E46 Unspecified protein-calorie malnutrition: Secondary | ICD-10-CM | POA: Diagnosis not present

## 2019-01-23 DIAGNOSIS — J305 Allergic rhinitis due to food: Secondary | ICD-10-CM | POA: Diagnosis not present

## 2019-01-23 DIAGNOSIS — J449 Chronic obstructive pulmonary disease, unspecified: Secondary | ICD-10-CM | POA: Diagnosis not present

## 2019-01-23 DIAGNOSIS — M545 Low back pain: Secondary | ICD-10-CM | POA: Diagnosis not present

## 2019-01-23 DIAGNOSIS — K52832 Lymphocytic colitis: Secondary | ICD-10-CM | POA: Diagnosis not present

## 2019-02-04 DIAGNOSIS — K210 Gastro-esophageal reflux disease with esophagitis: Secondary | ICD-10-CM | POA: Diagnosis not present

## 2019-02-04 DIAGNOSIS — R69 Illness, unspecified: Secondary | ICD-10-CM | POA: Diagnosis not present

## 2019-02-11 ENCOUNTER — Encounter: Payer: Self-pay | Admitting: Nurse Practitioner

## 2019-02-11 ENCOUNTER — Ambulatory Visit: Payer: Medicare HMO | Admitting: Nurse Practitioner

## 2019-02-11 ENCOUNTER — Other Ambulatory Visit: Payer: Self-pay

## 2019-02-11 ENCOUNTER — Encounter: Payer: Self-pay | Admitting: Internal Medicine

## 2019-02-11 VITALS — BP 123/73 | HR 76 | Temp 97.1°F | Ht 64.0 in | Wt 161.0 lb

## 2019-02-11 DIAGNOSIS — Z86010 Personal history of colonic polyps: Secondary | ICD-10-CM

## 2019-02-11 DIAGNOSIS — R1030 Lower abdominal pain, unspecified: Secondary | ICD-10-CM

## 2019-02-11 DIAGNOSIS — R197 Diarrhea, unspecified: Secondary | ICD-10-CM

## 2019-02-11 DIAGNOSIS — R109 Unspecified abdominal pain: Secondary | ICD-10-CM | POA: Insufficient documentation

## 2019-02-11 MED ORDER — PEG 3350-KCL-NA BICARB-NACL 420 G PO SOLR: 4000.0000 mL | ORAL | 0 refills | Status: DC

## 2019-02-11 MED ORDER — DICYCLOMINE HCL 10 MG PO CAPS: 10.0000 mg | ORAL_CAPSULE | Freq: Four times a day (QID) | ORAL | 3 refills | Status: DC | PRN

## 2019-02-11 NOTE — Progress Notes (Signed)
Referring Provider: Celene Squibb, MD Primary Care Physician:  Jennifer Squibb, MD Primary GI:  Dr. Gala [name]  Chief Complaint  Patient presents with  . Diarrhea    HPI:   Jennifer [name] is a 73 y.o. female who presents for follow-up on diarrhea and to schedule III year colonoscopy.  The patient was last seen in our office 11/21/2018 for chronic diarrhea, lymphocytic colitis, lower abdominal pain.  Her most recent visit was a virtual visit due to coronavirus/COVID-19 pandemic.  Noted chronic history of dilated common bile duct and chronic diarrhea.  Lymphocytic colitis diagnosed in 2017 and treated previously with Entocort.  Surveillance colonoscopy due later 2020 due to history of adenomatous colon polyps.  At her last visit she was taking Entocort and diarrhea has improved but not resolved.  At that time she was noting rare diarrhea, only twice in the past couple months.  Hemorrhoids improved with decreased frequency of stools.  Noted abdominal pain 3 times in the past week associated with her few episodes of diarrhea.  No other GI complaints.  Recommended completion of Entocort, call with 1 week progress report after finishing Entocort and if there is a relapse we can consider a protracted taper.  Consider IBS overlay with possible treatment options pending clinical progress, based on abdominal pain associated with diarrhea.  Follow-up in 2 months.  Most recent colonoscopy dated 02/19/2016 which found sigmoid diverticulosis, three 3 to 6 mm polyps in the ascending colon, otherwise normal, status post segmental biopsies for diarrhea to evaluate for microscopic colitis.  Surgical pathology found the polyps to be tubular adenoma and random colon biopsies consistent with lymphocytic colitis.  Recommended course of Entocort, repeat colonoscopy in 3 years (2020).  Today she states she's doing ok overall. Still having diarrhea. PCP gave her a new Rx for 9 mg x 2 weeks and is now on 6 mg for 2 weeks. Has varying  numbers of stools; today has already had 4 bowel movements. Yesterday she had 6 bowel movements. Some days once, some days none. Typically will have to go to the bathroom within 5 minutes of eating. Has some mid to lower abdominal pain associated with her diarrhea; pain typically resolves after a bowel movement. Has had intermittent hemorrhoid irritation and bleeding with frequent loose stools, typically in the form of toilet tissue hematochezia. Has occasional nausea, no vomiting; typically self-resolves. Denies melena, fever, chills. Weight fluctuates. Denies URI or flu-like symptoms. Denies loss of sense of taste or smell. Denies chest pain, dyspnea, dizziness, lightheadedness, syncope, near syncope. Denies any other upper or lower GI symptoms.  Past Medical History:  Diagnosis Date  . Depression   . GERD (gastroesophageal reflux disease)   . History of colon polyps    remote past, unable to retrieve records   . Hyperlipidemia   . Hypothyroidism   . Rheumatoid arthritis [facility]     Past Surgical History:  Procedure Laterality Date  . COLONOSCOPY     last one was about 3 yrs in [place]  . COLONOSCOPY N/A 02/19/2016   Dr. Gala [name]: three 3-6 mm polyps (tubular adenomas) in ascending colon, segmental biopsies consistent with lymphocytic colitis. Prescribed entocort.   . ESOPHAGOGASTRODUODENOSCOPY N/A 08/11/2014   RMR: MIld erosive  reflux esophagitis. small hiatal hernia. Abnormal gastric muocsa uncertain significance. Subtly abnormal duodeanl (bulbar) mucosa- status post multiple biopsies. BENIGN small bowel mucosa, no evidence of villous blulnting, mild reactive gastropathy on stomach biopsy   . GALLBLADDER SURGERY    .  LEG SURGERY     right  . PARTIAL HYSTERECTOMY      Current Outpatient Medications  Medication Sig Dispense Refill  . albuterol (VENTOLIN HFA) 108 (90 Base) MCG/ACT inhaler Inhale 2 puffs into the lungs every 6 (six) hours as needed for wheezing or shortness of breath.     Jennifer [name] ELLIPTA 62.5-25 MCG/INH AEPB Inhale 1 puff into the lungs daily.    . budesonide (ENTOCORT EC) 3 MG 24 hr capsule TAKE 3 CAPSULES BY MOUTH ONCE DAILY. (Patient taking differently: 6 mg. ) 90 capsule 2  . Dextromethorphan-guaiFENesin (MUCINEX DM MAXIMUM STRENGTH) 60-1200 MG TB12 Take by mouth as needed.    . diphenhydrAMINE HCl (EQ ALLERGY RELIEF PO) Take by mouth as needed.    . fluticasone (FLONASE) 50 MCG/ACT nasal spray Place 2 sprays into both nostrils 2 (two) times daily as needed for allergies.   1  . levothyroxine (SYNTHROID, LEVOTHROID) 100 MCG tablet Take 100 mcg by mouth daily before breakfast.    . magnesium gluconate (MAGONATE) 500 MG tablet Take 1,000 mg by mouth daily.    . methocarbamol (ROBAXIN) 500 MG tablet Take 500 mg by mouth every 6 (six) hours as needed for muscle spasms.    . mirabegron ER (MYRBETRIQ) 25 MG TB24 tablet Take 25 mg by mouth at bedtime.    . pantoprazole (PROTONIX) 40 MG tablet TAKE ONE TABLET BY MOUTH ONCE DAILY BEFORE BREAKFAST. 90 tablet 3  . simvastatin (ZOCOR) 20 MG tablet Take 40 mg by mouth daily at 6 PM.     . tiZANidine (ZANAFLEX) 2 MG tablet Take by mouth every 8 (eight) hours as needed for muscle spasms.     No current facility-administered medications for this visit.     Allergies as of 02/11/2019 - Review Complete 02/11/2019  Allergen Reaction Noted  . Desyrel [trazodone]  07/14/2014  . Keflex [cephalexin]  07/14/2014  . Klonopin [clonazepam]  07/14/2014  . Minocin [minocycline hcl]  07/14/2014  . Stelazine [trifluoperazine]  07/14/2014  . Viberzi [eluxadoline]  03/31/2015    Family History  Problem Relation Age of Onset  . Colon cancer Neg Hx     Social History   Socioeconomic History  . Marital status: Married    Spouse name: Not on file  . Number of children: 2  . Years of education: Not on file  . Highest education level: Not on file  Occupational History  . Occupation: disability  Social Needs  . Financial  resource strain: Not on file  . Food insecurity    Worry: Not on file    Inability: Not on file  . Transportation needs    Medical: Not on file    Non-medical: Not on file  Tobacco Use  . Smoking status: Former Smoker    Packs/day: 0.50    Types: Cigarettes    Quit date: 03/31/2018    Years since quitting: 0.8  . Smokeless tobacco: Never Used  . Tobacco comment: quit 03/31/18  Substance and Sexual Activity  . Alcohol use: No    Alcohol/week: 0.0 standard drinks  . Drug use: No  . Sexual activity: Not on file  Lifestyle  . Physical activity    Days per week: Not on file    Minutes per session: Not on file  . Stress: Not on file  Relationships  . Social Herbalist on phone: Not on file    Gets together: Not on file    Attends religious service: Not  on file    Active member of club or organization: Not on file    Attends meetings of clubs or organizations: Not on file    Relationship status: Not on file  Other Topics Concern  . Not on file  Social History Narrative  . Not on file    Review of Systems: General: Negative for anorexia, weight loss, fever, chills, fatigue, weakness. ENT: Negative for hoarseness, difficulty swallowing. CV: Negative for chest pain, angina, palpitations, peripheral edema.  Respiratory: Negative for dyspnea at rest, cough, sputum, wheezing.  GI: See history of present illness. Endo: Negative for unusual weight change.  Heme: Negative for bruising or bleeding. Allergy: Negative for rash or hives.   Physical Exam: BP 123/73   Pulse 76   Temp (!) 97.1 F (36.2 C) (Oral)   Ht 5\' 4"  (1.626 m)   Wt 161 lb (73 kg)   BMI 27.64 kg/m  General:   Alert and oriented. Pleasant and cooperative. Well-nourished and well-developed.  Eyes:  Without icterus, sclera clear and conjunctiva pink.  Ears:  Normal auditory acuity. Cardiovascular:  S1, S2 present without murmurs appreciated.Extremities without clubbing or edema. Respiratory:  Clear  to auscultation bilaterally. No wheezes, rales, or rhonchi. No distress.  Gastrointestinal:  +BS, soft, non-tender and non-distended. No HSM noted. No guarding or rebound. No masses appreciated.  Rectal:  Deferred  Musculoskalatal:  Symmetrical without gross deformities. Neurologic:  Alert and oriented x4;  grossly normal neurologically. Psych:  Alert and cooperative. Normal mood and affect. Heme/Lymph/Immune: No excessive bruising noted.    02/11/2019 9:00 AM   Disclaimer: This note was dictated with voice recognition software. Similar sounding words can inadvertently be transcribed and may not be corrected upon review.

## 2019-02-11 NOTE — Progress Notes (Signed)
CC'D TO PCP °

## 2019-02-11 NOTE — Assessment & Plan Note (Signed)
Chronic diarrhea diagnosed with lymphocytic colitis on biopsy in 2017.  She has since been treated with a couple courses of Entocort.  Entocort does not work as well currently as it did around her initial diagnosis.  Interestingly she does have abdominal pain associated with her diarrhea and her pain tends to get better typically after a bowel movement.  She also has postprandial urgency to have a bowel movement.  Has anywhere from 0-4 stools a day.  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  I feel she at least be considered.  I will send in Bentyl 10 mg up to 4 times a day as needed for abdominal cramping and diarrhea.  Follow-up in 2 months.

## 2019-02-11 NOTE — Assessment & Plan Note (Signed)
History of colon polyps and currently due for colonoscopy.  She does have chronic diarrhea, as per below.  Otherwise, she is generally asymptomatic from a GI standpoint.  We will proceed with colonoscopy to further evaluate for history of colon polyps.  Proceed with TCS with Dr. Gala Romney in near future: the risks, benefits, and alternatives have been discussed with the patient in detail. The patient states understanding and desires to proceed.  The patient is not on any anticoagulants, anxiolytics, chronic pain medications, or antidepressants.  Conscious sedation should be adequate for her procedure as it was for her last.

## 2019-02-11 NOTE — Assessment & Plan Note (Signed)
Noted abdominal pain generally mid to lower abdomen.  This typically resolves with a bowel movement.  Query overlay of IBS on top of lymphocytic colitis, as per below.  We will start her on Bentyl to see if this makes any improvement.  She is due for colonoscopy at this time which will help shed further light on her symptoms.  Follow-up in 2 months.

## 2019-02-11 NOTE — Patient Instructions (Signed)
Your health issues we discussed today were:   Abdominal pain and diarrhea: 1. Continue taking Entocort prescribed by your primary care 2. I will send a prescription to your pharmacy for Bentyl 10 mg.  He can take this up to 4 times a day as needed. 3. Call us and let us know if this is helping after you have been taking it for couple weeks 4. Call us if you have any severe worsening symptoms  Need for colonoscopy: 1. You are currently due for colonoscopy 2. Further recommendations will be made after your colonoscopy  Overall I recommend:  1. Continue your current medications 2. Return for follow-up in 2 months 3. Call us if you have any questions or concerns.   Because of recent events of COVID-19 ("Coronavirus"), follow CDC recommendations:  1. Wash your hand frequently 2. Avoid touching your face 3. Stay away from people who are sick 4. If you have symptoms such as fever, cough, shortness of breath then call your healthcare provider for further guidance 5. If you are sick, STAY AT HOME unless otherwise directed by your healthcare provider. 6. Follow directions from state and national officials regarding staying safe   At [facility] we value your feedback. You may receive a survey about your visit today. Please share your experience as we strive to create trusting relationships with our patients to provide genuine, compassionate, quality care.  We appreciate your understanding and patience as we review any laboratory studies, imaging, and other diagnostic tests that are ordered as we care for you. Our office policy is 5 business days for review of these results, and any emergent or urgent results are addressed in a timely manner for your best interest. If you do not hear from our office in 1 week, please contact us.   We also encourage the use of MyChart, which contains your medical information for your review as well. If you are not enrolled in this feature, an  access code is on this after visit summary for your convenience. Thank you for allowing [place] to be involved in your care.  It was great to see you today!  I hope you have a great summer!!

## 2019-02-11 NOTE — H&P (View-Only) (Signed)
Referring Provider: Celene Squibb, MD Primary Care Physician:  Celene Squibb, MD Primary GI:  Dr. Gala Romney  Chief Complaint  Patient presents with  . Diarrhea    HPI:   Jennifer [name] is a 73 y.o. female who presents for follow-up on diarrhea and to schedule III year colonoscopy.  The patient was last seen in our office 11/21/2018 for chronic diarrhea, lymphocytic colitis, lower abdominal pain.  Her most recent visit was a virtual visit due to coronavirus/COVID-19 pandemic.  Noted chronic history of dilated common bile duct and chronic diarrhea.  Lymphocytic colitis diagnosed in 2017 and treated previously with Entocort.  Surveillance colonoscopy due later 2020 due to history of adenomatous colon polyps.  At her last visit she was taking Entocort and diarrhea has improved but not resolved.  At that time she was noting rare diarrhea, only twice in the past couple months.  Hemorrhoids improved with decreased frequency of stools.  Noted abdominal pain 3 times in the past week associated with her few episodes of diarrhea.  No other GI complaints.  Recommended completion of Entocort, call with 1 week progress report after finishing Entocort and if there is a relapse we can consider a protracted taper.  Consider IBS overlay with possible treatment options pending clinical progress, based on abdominal pain associated with diarrhea.  Follow-up in 2 months.  Most recent colonoscopy dated 02/19/2016 which found sigmoid diverticulosis, three 3 to 6 mm polyps in the ascending colon, otherwise normal, status post segmental biopsies for diarrhea to evaluate for microscopic colitis.  Surgical pathology found the polyps to be tubular adenoma and random colon biopsies consistent with lymphocytic colitis.  Recommended course of Entocort, repeat colonoscopy in 3 years (2020).  Today she states she's doing ok overall. Still having diarrhea. PCP gave her a new Rx for 9 mg x 2 weeks and is now on 6 mg for 2 weeks. Has varying  numbers of stools; today has already had 4 bowel movements. Yesterday she had 6 bowel movements. Some days once, some days none. Typically will have to go to the bathroom within 5 minutes of eating. Has some mid to lower abdominal pain associated with her diarrhea; pain typically resolves after a bowel movement. Has had intermittent hemorrhoid irritation and bleeding with frequent loose stools, typically in the form of toilet tissue hematochezia. Has occasional nausea, no vomiting; typically self-resolves. Denies melena, fever, chills. Weight fluctuates. Denies URI or flu-like symptoms. Denies loss of sense of taste or smell. Denies chest pain, dyspnea, dizziness, lightheadedness, syncope, near syncope. Denies any other upper or lower GI symptoms.  Past Medical History:  Diagnosis Date  . Depression   . GERD (gastroesophageal reflux disease)   . History of colon polyps    remote past, unable to retrieve records   . Hyperlipidemia   . Hypothyroidism   . Rheumatoid arthritis [facility])     Past Surgical History:  Procedure Laterality Date  . COLONOSCOPY     last one was about 3 yrs in [place]  . COLONOSCOPY N/A 02/19/2016   Dr. Gala Romney: three 3-6 mm polyps (tubular adenomas) in ascending colon, segmental biopsies consistent with lymphocytic colitis. Prescribed entocort.   . ESOPHAGOGASTRODUODENOSCOPY N/A 08/11/2014   RMR: MIld erosive  reflux esophagitis. small hiatal hernia. Abnormal gastric muocsa uncertain significance. Subtly abnormal duodeanl (bulbar) mucosa- status post multiple biopsies. BENIGN small bowel mucosa, no evidence of villous blulnting, mild reactive gastropathy on stomach biopsy   . GALLBLADDER SURGERY    .  LEG SURGERY     right  . PARTIAL HYSTERECTOMY      Current Outpatient Medications  Medication Sig Dispense Refill  . albuterol (VENTOLIN HFA) 108 (90 Base) MCG/ACT inhaler Inhale 2 puffs into the lungs every 6 (six) hours as needed for wheezing or shortness of breath.     [name] ELLIPTA 62.5-25 MCG/INH AEPB Inhale 1 puff into the lungs daily.    . budesonide (ENTOCORT EC) 3 MG 24 hr capsule TAKE 3 CAPSULES BY MOUTH ONCE DAILY. (Patient taking differently: 6 mg. ) 90 capsule 2  . Dextromethorphan-guaiFENesin (MUCINEX DM MAXIMUM STRENGTH) 60-1200 MG TB12 Take by mouth as needed.    . diphenhydrAMINE HCl (EQ ALLERGY RELIEF PO) Take by mouth as needed.    . fluticasone (FLONASE) 50 MCG/ACT nasal spray Place 2 sprays into both nostrils 2 (two) times daily as needed for allergies.   1  . levothyroxine (SYNTHROID, LEVOTHROID) 100 MCG tablet Take 100 mcg by mouth daily before breakfast.    . magnesium gluconate (MAGONATE) 500 MG tablet Take 1,000 mg by mouth daily.    . methocarbamol (ROBAXIN) 500 MG tablet Take 500 mg by mouth every 6 (six) hours as needed for muscle spasms.    . mirabegron ER (MYRBETRIQ) 25 MG TB24 tablet Take 25 mg by mouth at bedtime.    . pantoprazole (PROTONIX) 40 MG tablet TAKE ONE TABLET BY MOUTH ONCE DAILY BEFORE BREAKFAST. 90 tablet 3  . simvastatin (ZOCOR) 20 MG tablet Take 40 mg by mouth daily at 6 PM.     . tiZANidine (ZANAFLEX) 2 MG tablet Take by mouth every 8 (eight) hours as needed for muscle spasms.     No current facility-administered medications for this visit.     Allergies as of 02/11/2019 - Review Complete 02/11/2019  Allergen Reaction Noted  . Desyrel [trazodone]  07/14/2014  . Keflex [cephalexin]  07/14/2014  . Klonopin [clonazepam]  07/14/2014  . Minocin [minocycline hcl]  07/14/2014  . Stelazine [trifluoperazine]  07/14/2014  . Viberzi [eluxadoline]  03/31/2015    Family History  Problem Relation Age of Onset  . Colon cancer Neg Hx     Social History   Socioeconomic History  . Marital status: Married    Spouse name: Not on file  . Number of children: 2  . Years of education: Not on file  . Highest education level: Not on file  Occupational History  . Occupation: disability  Social Needs  . Financial  resource strain: Not on file  . Food insecurity    Worry: Not on file    Inability: Not on file  . Transportation needs    Medical: Not on file    Non-medical: Not on file  Tobacco Use  . Smoking status: Former Smoker    Packs/day: 0.50    Types: Cigarettes    Quit date: 03/31/2018    Years since quitting: 0.8  . Smokeless tobacco: Never Used  . Tobacco comment: quit 03/31/18  Substance and Sexual Activity  . Alcohol use: No    Alcohol/week: 0.0 standard drinks  . Drug use: No  . Sexual activity: Not on file  Lifestyle  . Physical activity    Days per week: Not on file    Minutes per session: Not on file  . Stress: Not on file  Relationships  . Social Herbalist on phone: Not on file    Gets together: Not on file    Attends religious service: Not  on file    Active member of club or organization: Not on file    Attends meetings of clubs or organizations: Not on file    Relationship status: Not on file  Other Topics Concern  . Not on file  Social History Narrative  . Not on file    Review of Systems: General: Negative for anorexia, weight loss, fever, chills, fatigue, weakness. ENT: Negative for hoarseness, difficulty swallowing. CV: Negative for chest pain, angina, palpitations, peripheral edema.  Respiratory: Negative for dyspnea at rest, cough, sputum, wheezing.  GI: See history of present illness. Endo: Negative for unusual weight change.  Heme: Negative for bruising or bleeding. Allergy: Negative for rash or hives.   Physical Exam: BP 123/73   Pulse 76   Temp (!) 97.1 F (36.2 C) (Oral)   Ht 5\' 4"  (1.626 m)   Wt 161 lb (73 kg)   BMI 27.64 kg/m  General:   Alert and oriented. Pleasant and cooperative. Well-nourished and well-developed.  Eyes:  Without icterus, sclera clear and conjunctiva pink.  Ears:  Normal auditory acuity. Cardiovascular:  S1, S2 present without murmurs appreciated.Extremities without clubbing or edema. Respiratory:  Clear  to auscultation bilaterally. No wheezes, rales, or rhonchi. No distress.  Gastrointestinal:  +BS, soft, non-tender and non-distended. No HSM noted. No guarding or rebound. No masses appreciated.  Rectal:  Deferred  Musculoskalatal:  Symmetrical without gross deformities. Neurologic:  Alert and oriented x4;  grossly normal neurologically. Psych:  Alert and cooperative. Normal mood and affect. Heme/Lymph/Immune: No excessive bruising noted.    02/11/2019 9:00 AM   Disclaimer: This note was dictated with voice recognition software. Similar sounding words can inadvertently be transcribed and may not be corrected upon review.

## 2019-02-22 DIAGNOSIS — E663 Overweight: Secondary | ICD-10-CM | POA: Diagnosis not present

## 2019-02-22 DIAGNOSIS — R768 Other specified abnormal immunological findings in serum: Secondary | ICD-10-CM | POA: Diagnosis not present

## 2019-02-22 DIAGNOSIS — L299 Pruritus, unspecified: Secondary | ICD-10-CM | POA: Diagnosis not present

## 2019-02-22 DIAGNOSIS — H52229 Regular astigmatism, unspecified eye: Secondary | ICD-10-CM | POA: Diagnosis not present

## 2019-02-22 DIAGNOSIS — M2550 Pain in unspecified joint: Secondary | ICD-10-CM | POA: Diagnosis not present

## 2019-02-22 DIAGNOSIS — Z6828 Body mass index (BMI) 28.0-28.9, adult: Secondary | ICD-10-CM | POA: Diagnosis not present

## 2019-03-04 ENCOUNTER — Other Ambulatory Visit (HOSPITAL_COMMUNITY)
Admission: RE | Admit: 2019-03-04 | Discharge: 2019-03-04 | Disposition: A | Discharge status: Home / Self Care | Payer: Medicare HMO | Source: Ambulatory Visit | Attending: Internal Medicine | Admitting: Internal Medicine

## 2019-03-04 DIAGNOSIS — Z01812 Encounter for preprocedural laboratory examination: Secondary | ICD-10-CM | POA: Diagnosis not present

## 2019-03-04 DIAGNOSIS — Z20828 Contact with and (suspected) exposure to other viral communicable diseases: Secondary | ICD-10-CM | POA: Diagnosis not present

## 2019-03-04 LAB — SARS CORONAVIRUS 2 (TAT 6-24 HRS): SARS Coronavirus 2: NEGATIVE

## 2019-03-06 ENCOUNTER — Encounter (HOSPITAL_COMMUNITY): Payer: Self-pay | Admitting: *Deleted

## 2019-03-06 ENCOUNTER — Other Ambulatory Visit: Payer: Self-pay

## 2019-03-06 ENCOUNTER — Encounter (HOSPITAL_COMMUNITY)
Admission: RE | Disposition: A | Discharge status: Home / Self Care | Payer: Self-pay | Source: Home / Self Care | Attending: Internal Medicine

## 2019-03-06 ENCOUNTER — Ambulatory Visit (HOSPITAL_COMMUNITY)
Admission: RE | Admit: 2019-03-06 | Discharge: 2019-03-06 | Disposition: A | Discharge status: Home / Self Care | Payer: Medicare HMO | Attending: Internal Medicine | Admitting: Internal Medicine

## 2019-03-06 DIAGNOSIS — M069 Rheumatoid arthritis, unspecified: Secondary | ICD-10-CM | POA: Insufficient documentation

## 2019-03-06 DIAGNOSIS — Z79899 Other long term (current) drug therapy: Secondary | ICD-10-CM | POA: Insufficient documentation

## 2019-03-06 DIAGNOSIS — K5730 Diverticulosis of large intestine without perforation or abscess without bleeding: Secondary | ICD-10-CM | POA: Insufficient documentation

## 2019-03-06 DIAGNOSIS — E785 Hyperlipidemia, unspecified: Secondary | ICD-10-CM | POA: Insufficient documentation

## 2019-03-06 DIAGNOSIS — D120 Benign neoplasm of cecum: Secondary | ICD-10-CM | POA: Insufficient documentation

## 2019-03-06 DIAGNOSIS — Z87891 Personal history of nicotine dependence: Secondary | ICD-10-CM | POA: Insufficient documentation

## 2019-03-06 DIAGNOSIS — E039 Hypothyroidism, unspecified: Secondary | ICD-10-CM | POA: Diagnosis not present

## 2019-03-06 DIAGNOSIS — Z79890 Hormone replacement therapy (postmenopausal): Secondary | ICD-10-CM | POA: Diagnosis not present

## 2019-03-06 DIAGNOSIS — R197 Diarrhea, unspecified: Secondary | ICD-10-CM | POA: Insufficient documentation

## 2019-03-06 DIAGNOSIS — K219 Gastro-esophageal reflux disease without esophagitis: Secondary | ICD-10-CM | POA: Insufficient documentation

## 2019-03-06 DIAGNOSIS — Z86010 Personal history of colonic polyps: Secondary | ICD-10-CM | POA: Diagnosis not present

## 2019-03-06 DIAGNOSIS — K635 Polyp of colon: Secondary | ICD-10-CM

## 2019-03-06 DIAGNOSIS — F329 Major depressive disorder, single episode, unspecified: Secondary | ICD-10-CM | POA: Diagnosis not present

## 2019-03-06 DIAGNOSIS — Z7951 Long term (current) use of inhaled steroids: Secondary | ICD-10-CM | POA: Insufficient documentation

## 2019-03-06 DIAGNOSIS — R69 Illness, unspecified: Secondary | ICD-10-CM | POA: Diagnosis not present

## 2019-03-06 HISTORY — PX: COLONOSCOPY: SHX5424

## 2019-03-06 SURGERY — COLONOSCOPY
Anesthesia: Moderate Sedation

## 2019-03-06 MED ORDER — STERILE WATER FOR IRRIGATION IR SOLN
Status: DC | PRN
  Administered 2019-03-06: 11:00:00 1.5 mL

## 2019-03-06 MED ORDER — MEPERIDINE HCL 100 MG/ML IJ SOLN
INTRAMUSCULAR | Status: DC | PRN
  Administered 2019-03-06: 25 mg via INTRAVENOUS
  Administered 2019-03-06: 10 mg via INTRAVENOUS
  Administered 2019-03-06: 15 mg via INTRAVENOUS

## 2019-03-06 MED ORDER — MEPERIDINE HCL 50 MG/ML IJ SOLN
INTRAMUSCULAR | Status: AC
  Filled 2019-03-06: qty 1

## 2019-03-06 MED ORDER — MIDAZOLAM HCL 5 MG/5ML IJ SOLN
INTRAMUSCULAR | Status: DC | PRN
  Administered 2019-03-06 (×2): 2 mg via INTRAVENOUS
  Administered 2019-03-06: 1 mg via INTRAVENOUS

## 2019-03-06 MED ORDER — MIDAZOLAM HCL 5 MG/5ML IJ SOLN
INTRAMUSCULAR | Status: AC
  Filled 2019-03-06: qty 10

## 2019-03-06 MED ORDER — ONDANSETRON HCL 4 MG/2ML IJ SOLN
INTRAMUSCULAR | Status: AC
  Filled 2019-03-06: qty 2

## 2019-03-06 MED ORDER — ONDANSETRON HCL 4 MG/2ML IJ SOLN
INTRAMUSCULAR | Status: DC | PRN
  Administered 2019-03-06: 4 mg via INTRAVENOUS

## 2019-03-06 MED ORDER — SODIUM CHLORIDE 0.9 % IV SOLN
INTRAVENOUS | Status: DC
  Administered 2019-03-06: 11:00:00 via INTRAVENOUS

## 2019-03-06 NOTE — Op Note (Signed)
[facility] Patient Name: Jennifer [name] Procedure Date: 03/06/2019 10:33 AM MRN: [id number] Date of Birth: [birth year] Attending MD: Norvel Richards , MD CSN: [id number] Age: 73 Admit Type: Outpatient Procedure:                Colonoscopy Indications:              High risk colon cancer surveillance: Personal                            history of colonic polyps Providers:                Norvel Richards, MD, Charlsie Quest. Theda Sers RN, RN,                            Raphael Gibney, Technician Referring MD:              Medicines:                Midazolam 5 mg IV, Meperidine 50 mg IV Complications:            No immediate complications. Estimated Blood Loss:     Estimated blood loss was minimal. Procedure:                Pre-Anesthesia Assessment:                           - Prior to the procedure, a History and Physical                            was performed, and patient medications and                            allergies were reviewed. The patient's tolerance of                            previous anesthesia was also reviewed. The risks                            and benefits of the procedure and the sedation                            options and risks were discussed with the patient.                            All questions were answered, and informed consent                            was obtained. Prior Anticoagulants: The patient has                            taken no previous anticoagulant or antiplatelet                            agents. ASA Grade Assessment: II - A patient with  mild systemic disease. After reviewing the risks                            and benefits, the patient was deemed in                            satisfactory condition to undergo the procedure.                           After obtaining informed consent, the colonoscope                            was passed under direct vision. Throughout the                             procedure, the patient's blood pressure, pulse, and                            oxygen saturations were monitored continuously. The                            CF-[id number] ([id number]) scope was introduced through                            the anus and advanced to the the cecum, identified                            by appendiceal orifice and ileocecal valve. The                            colonoscopy was performed without difficulty. The                            patient tolerated the procedure well. The quality                            of the bowel preparation was adequate. Scope In: 10:50:41 AM Scope Out: 11:08:52 AM Scope Withdrawal Time: 0 hours 10 minutes 17 seconds  Total Procedure Duration: 0 hours 18 minutes 11 seconds  Findings:      The perianal and digital rectal examinations were normal.      Two sessile polyps were found in the ileocecal valve. The polyps were 5       to 6 mm in size. These polyps were removed with a cold snare. Resection       and retrieval were complete. Estimated blood loss was minimal.      Scattered medium-mouthed diverticula were found in the sigmoid colon and       descending colon.      The exam was otherwise without abnormality on direct and retroflexion       views. Impression:               - Two 5 to 6 mm polyps at the ileocecal valve,  removed with a cold snare. Resected and retrieved.                           - Diverticulosis in the sigmoid colon and in the                            descending colon.                           - The examination was otherwise normal on direct                            and retroflexion views. Moderate Sedation:      Moderate (conscious) sedation was administered by the endoscopy nurse       and supervised by the endoscopist. The following parameters were       monitored: oxygen saturation, heart rate, blood pressure, respiratory       rate, EKG, adequacy of pulmonary ventilation, and  response to care.       Total physician intraservice time was 20 minutes. Recommendation:           - Patient has a contact number available for                            emergencies. The signs and symptoms of potential                            delayed complications were discussed with the                            patient. Return to normal activities tomorrow.                            Written discharge instructions were provided to the                            patient.                           - Resume previous diet.                           - Continue present medications.                           - Repeat colonoscopy date to be determined after                            pending pathology results are reviewed for                            surveillance.                           - Return to GI office in 1 month. Procedure Code(s):        --- Professional ---  45385, Colonoscopy, flexible; with removal of                            tumor(s), polyp(s), or other lesion(s) by snare                            technique                           G0500, Moderate sedation services provided by the                            same physician or other qualified health care                            professional performing a gastrointestinal                            endoscopic service that sedation supports,                            requiring the presence of an independent trained                            observer to assist in the monitoring of the                            patient's level of consciousness and physiological                            status; initial 15 minutes of intra-service time;                            patient age 42 years or older (additional time may                            be reported with [phone], as appropriate) Diagnosis Code(s):        --- Professional ---                           Z86.010, Personal history of colonic  polyps                           K63.5, Polyp of colon                           K57.30, Diverticulosis of large intestine without                            perforation or abscess without bleeding CPT copyright 2019 [employer]. All rights reserved. The codes documented in this report are preliminary and upon coder review may  be revised to meet current compliance requirements. Cristopher Estimable. Niki Cosman, MD Norvel Richards, MD 03/06/2019 11:16:23 AM This report has been signed electronically. Number of Addenda: 0

## 2019-03-06 NOTE — Discharge Instructions (Signed)
Colonoscopy Discharge Instructions  Read the instructions outlined below and refer to this sheet in the next few weeks. These discharge instructions provide you with general information on caring for yourself after you leave the hospital. Your doctor may also give you specific instructions. While your treatment has been planned according to the most current medical practices available, unavoidable complications occasionally occur. If you have any problems or questions after discharge, call Dr. Gala Romney at [phone]. ACTIVITY  You may resume your regular activity, but move at a slower pace for the next 24 hours.   Take frequent rest periods for the next 24 hours.   Walking will help get rid of the air and reduce the bloated feeling in your belly (abdomen).   No driving for 24 hours (because of the medicine (anesthesia) used during the test).    Do not sign any important legal documents or operate any machinery for 24 hours (because of the anesthesia used during the test).  NUTRITION  Drink plenty of fluids.   You may resume your normal diet as instructed by your doctor.   Begin with a light meal and progress to your normal diet. Heavy or fried foods are harder to digest and may make you feel sick to your stomach (nauseated).   Avoid alcoholic beverages for 24 hours or as instructed.  MEDICATIONS  You may resume your normal medications unless your doctor tells you otherwise.  WHAT YOU CAN EXPECT TODAY  Some feelings of bloating in the abdomen.   Passage of more gas than usual.   Spotting of blood in your stool or on the toilet paper.  IF YOU HAD POLYPS REMOVED DURING THE COLONOSCOPY:  No aspirin products for 7 days or as instructed.   No alcohol for 7 days or as instructed.   Eat a soft diet for the next 24 hours.  FINDING OUT THE RESULTS OF YOUR TEST Not all test results are available during your visit. If your test results are not back during the visit, make an appointment  with your caregiver to find out the results. Do not assume everything is normal if you have not heard from your caregiver or the medical facility. It is important for you to follow up on all of your test results.  SEEK IMMEDIATE MEDICAL ATTENTION IF:  You have more than a spotting of blood in your stool.   Your belly is swollen (abdominal distention).   You are nauseated or vomiting.   You have a temperature over 101.   You have abdominal pain or discomfort that is severe or gets worse throughout the day.    Colon polyp and diverticulosis information provided  Further recommendations to follow pending review of pathology report  I called patient's friend at her request, Gracy Racer, at [phone] and discussed findings and recommendations.   Diverticulosis  Diverticulosis is a condition that develops when small pouches (diverticula) form in the wall of the large intestine (colon). The colon is where water is absorbed and stool is formed. The pouches form when the inside layer of the colon pushes through weak spots in the outer layers of the colon. You may have a few pouches or many of them. What are the causes? The cause of this condition is not known. What increases the risk? The following factors may make you more likely to develop this condition:  Being older than age 85. Your risk for this condition increases with age. Diverticulosis is rare among people younger than age 24. By  age 50, many people have it.  Eating a low-fiber diet.  Having frequent constipation.  Being overweight.  Not getting enough exercise.  Smoking.  Taking over-the-counter pain medicines, like aspirin and ibuprofen.  Having a family history of diverticulosis. What are the signs or symptoms? In most people, there are no symptoms of this condition. If you do have symptoms, they may include:  Bloating.  Cramps in the abdomen.  Constipation or diarrhea.  Pain in the lower left side of the  abdomen. How is this diagnosed? This condition is most often diagnosed during an exam for other colon problems. Because diverticulosis usually has no symptoms, it often cannot be diagnosed independently. This condition may be diagnosed by:  Using a flexible scope to examine the colon (colonoscopy).  Taking an X-ray of the colon after dye has been put into the colon (barium enema).  Doing a CT scan. How is this treated? You may not need treatment for this condition if you have never developed an infection related to diverticulosis. If you have had an infection before, treatment may include:  Eating a high-fiber diet. This may include eating more fruits, vegetables, and grains.  Taking a fiber supplement.  Taking a live bacteria supplement (probiotic).  Taking medicine to relax your colon.  Taking antibiotic medicines. Follow these instructions at home:  Drink 6-8 glasses of water or more each day to prevent constipation.  Try not to strain when you have a bowel movement.  If you have had an infection before: ? Eat more fiber as directed by your health care provider or your diet and nutrition specialist (dietitian). ? Take a fiber supplement or probiotic, if your health care provider approves.  Take over-the-counter and prescription medicines only as told by your health care provider.  If you were prescribed an antibiotic, take it as told by your health care provider. Do not stop taking the antibiotic even if you start to feel better.  Keep all follow-up visits as told by your health care provider. This is important. Contact a health care provider if:  You have pain in your abdomen.  You have bloating.  You have cramps.  You have not had a bowel movement in 3 days. Get help right away if:  Your pain gets worse.  Your bloating becomes very bad.  You have a fever or chills, and your symptoms suddenly get worse.  You vomit.  You have bowel movements that are bloody  or black.  You have bleeding from your rectum. Summary  Diverticulosis is a condition that develops when small pouches (diverticula) form in the wall of the large intestine (colon).  You may have a few pouches or many of them.  This condition is most often diagnosed during an exam for other colon problems.  If you have had an infection related to diverticulosis, treatment may include increasing the fiber in your diet, taking supplements, or taking medicines. This information is not intended to replace advice given to you by your health care provider. Make sure you discuss any questions you have with your health care provider. Document Released: 04/14/2004 Document Revised: 06/30/2017 Document Reviewed: 06/06/2016 Elsevier Patient Education  [place].  Colon Polyps  Polyps are tissue growths inside the body. Polyps can grow in many places, including the large intestine (colon). A polyp may be a round bump or a mushroom-shaped growth. You could have one polyp or several. Most colon polyps are noncancerous (benign). However, some colon polyps can become cancerous over  time. Finding and removing the polyps early can help prevent this. What are the causes? The exact cause of colon polyps is not known. What increases the risk? You are more likely to develop this condition if you:  Have a family history of colon cancer or colon polyps.  Are older than 67 or older than 45 if you are African American.  Have inflammatory bowel disease, such as ulcerative colitis or Crohn's disease.  Have certain hereditary conditions, such as: ? Familial adenomatous polyposis. ? Lynch syndrome. ? Turcot syndrome. ? Peutz-Jeghers syndrome.  Are overweight.  Smoke cigarettes.  Do not get enough exercise.  Drink too much alcohol.  Eat a diet that is high in fat and red meat and low in fiber.  Had childhood cancer that was treated with abdominal radiation. What are the signs or  symptoms? Most polyps do not cause symptoms. If you have symptoms, they may include:  Blood coming from your rectum when having a bowel movement.  Blood in your stool. The stool may look dark red or black.  Abdominal pain.  A change in bowel habits, such as constipation or diarrhea. How is this diagnosed? This condition is diagnosed with a colonoscopy. This is a procedure in which a lighted, flexible scope is inserted into the anus and then passed into the colon to examine the area. Polyps are sometimes found when a colonoscopy is done as part of routine cancer screening tests. How is this treated? Treatment for this condition involves removing any polyps that are found. Most polyps can be removed during a colonoscopy. Those polyps will then be tested for cancer. Additional treatment may be needed depending on the results of testing. Follow these instructions at home: Lifestyle  Maintain a healthy weight, or lose weight if recommended by your health care provider.  Exercise every day or as told by your health care provider.  Do not use any products that contain nicotine or tobacco, such as cigarettes and e-cigarettes. If you need help quitting, ask your health care provider.  If you drink alcohol, limit how much you have: ? 0-1 drink a day for women. ? 0-2 drinks a day for men.  Be aware of how much alcohol is in your drink. In the [place] one drink equals one 12 oz bottle of beer (355 mL), one 5 oz glass of wine (148 mL), or one 1 oz shot of hard liquor (44 mL). Eating and drinking   Eat foods that are high in fiber, such as fruits, vegetables, and whole grains.  Eat foods that are high in calcium and vitamin D, such as milk, cheese, yogurt, eggs, liver, fish, and broccoli.  Limit foods that are high in fat, such as fried foods and desserts.  Limit the amount of red meat and processed meat you eat, such as hot dogs, sausage, bacon, and lunch meats. General instructions  Keep  all follow-up visits as told by your health care provider. This is important. ? This includes having regularly scheduled colonoscopies. ? Talk to your health care provider about when you need a colonoscopy. Contact a health care provider if:  You have new or worsening bleeding during a bowel movement.  You have new or increased blood in your stool.  You have a change in bowel habits.  You lose weight for no known reason. Summary  Polyps are tissue growths inside the body. Polyps can grow in many places, including the colon.  Most colon polyps are noncancerous (benign), but some can  become cancerous over time.  This condition is diagnosed with a colonoscopy.  Treatment for this condition involves removing any polyps that are found. Most polyps can be removed during a colonoscopy. This information is not intended to replace advice given to you by your health care provider. Make sure you discuss any questions you have with your health care provider. Document Released: 04/13/2004 Document Revised: 11/02/2017 Document Reviewed: 11/02/2017 Elsevier Patient Education  2020 [employer].

## 2019-03-06 NOTE — H&P (Signed)
No change.  Diarrhea improved with Bentyl/Entocort.  Surveillance colonoscopy today per plan. The risks, benefits, limitations, alternatives and imponderables have been reviewed with the patient. Questions have been answered. All parties are agreeable.

## 2019-03-06 NOTE — Interval H&P Note (Signed)
History and Physical Interval Note:  03/06/2019 10:42 AM  Jennifer [name]  has presented today for surgery, with the diagnosis of history of polyps, diarrhea.  The various methods of treatment have been discussed with the patient and family. After consideration of risks, benefits and other options for treatment, the patient has consented to  Procedure(s) with comments: COLONOSCOPY (N/A) - 12:00pm as a surgical intervention.  The patient's history has been reviewed, patient examined, no change in status, stable for surgery.  I have reviewed the patient's chart and labs.  Questions were answered to the patient's satisfaction.     [name]  No change.

## 2019-03-08 DIAGNOSIS — J305 Allergic rhinitis due to food: Secondary | ICD-10-CM | POA: Diagnosis not present

## 2019-03-08 DIAGNOSIS — L03314 Cellulitis of groin: Secondary | ICD-10-CM | POA: Diagnosis not present

## 2019-03-08 DIAGNOSIS — S161XXA Strain of muscle, fascia and tendon at neck level, initial encounter: Secondary | ICD-10-CM | POA: Diagnosis not present

## 2019-03-08 DIAGNOSIS — R69 Illness, unspecified: Secondary | ICD-10-CM | POA: Diagnosis not present

## 2019-03-08 DIAGNOSIS — H65199 Other acute nonsuppurative otitis media, unspecified ear: Secondary | ICD-10-CM | POA: Diagnosis not present

## 2019-03-08 DIAGNOSIS — S46012A Strain of muscle(s) and tendon(s) of the rotator cuff of left shoulder, initial encounter: Secondary | ICD-10-CM | POA: Diagnosis not present

## 2019-03-08 DIAGNOSIS — E559 Vitamin D deficiency, unspecified: Secondary | ICD-10-CM | POA: Diagnosis not present

## 2019-03-08 DIAGNOSIS — L299 Pruritus, unspecified: Secondary | ICD-10-CM | POA: Diagnosis not present

## 2019-03-08 DIAGNOSIS — N390 Urinary tract infection, site not specified: Secondary | ICD-10-CM | POA: Diagnosis not present

## 2019-03-08 DIAGNOSIS — M545 Low back pain: Secondary | ICD-10-CM | POA: Diagnosis not present

## 2019-03-09 ENCOUNTER — Encounter: Payer: Self-pay | Admitting: Internal Medicine

## 2019-03-18 DIAGNOSIS — J019 Acute sinusitis, unspecified: Secondary | ICD-10-CM | POA: Diagnosis not present

## 2019-03-19 ENCOUNTER — Encounter (HOSPITAL_COMMUNITY): Payer: Self-pay | Admitting: Internal Medicine

## 2019-03-20 DIAGNOSIS — M79672 Pain in left foot: Secondary | ICD-10-CM | POA: Diagnosis not present

## 2019-03-20 DIAGNOSIS — M79675 Pain in left toe(s): Secondary | ICD-10-CM | POA: Diagnosis not present

## 2019-03-20 DIAGNOSIS — S93332D Other subluxation of left foot, subsequent encounter: Secondary | ICD-10-CM | POA: Diagnosis not present

## 2019-03-22 DIAGNOSIS — K210 Gastro-esophageal reflux disease with esophagitis: Secondary | ICD-10-CM | POA: Diagnosis not present

## 2019-03-22 DIAGNOSIS — R69 Illness, unspecified: Secondary | ICD-10-CM | POA: Diagnosis not present

## 2019-03-25 DIAGNOSIS — R69 Illness, unspecified: Secondary | ICD-10-CM | POA: Diagnosis not present

## 2019-03-25 DIAGNOSIS — J019 Acute sinusitis, unspecified: Secondary | ICD-10-CM | POA: Diagnosis not present

## 2019-03-25 DIAGNOSIS — J441 Chronic obstructive pulmonary disease with (acute) exacerbation: Secondary | ICD-10-CM | POA: Diagnosis not present

## 2019-03-27 ENCOUNTER — Telehealth: Payer: Self-pay | Admitting: Internal Medicine

## 2019-03-27 DIAGNOSIS — R197 Diarrhea, unspecified: Secondary | ICD-10-CM

## 2019-03-27 NOTE — Telephone Encounter (Signed)
Noted. Pt is aware of IFOBT, labs and stool test needed. Pt will stop by our office in the morning to pick up IFOBT. Pt is aware that she needs to return the IFOBT kit to our office.

## 2019-03-27 NOTE — Telephone Encounter (Signed)
Pt called with c/o diarrhea and severe sharp pain starting underneath her breast down to her rectum. Pt went to the bathroom 11-12 times last night. Pt also states that she had some stool come out at different times in her depends. Diarrhea is slimy and black. Pt started Amoxicillin 875/125 mg 1 week ago for a possible sinus infection vs COPD/lung infection. Pt has 4 Amoxicillin capsules left. Pt was also placed on Prednisone which she finished on Monday 03/25/2019. Pt is suppose to pick up another Prednisone RX since her symptoms aren't gone.

## 2019-03-27 NOTE — Telephone Encounter (Signed)
Due to recent abx exposure and large volume/frequent, black stools lets:  Check CBC, CDiff, and GI Path (orders entered) Check an iFOBT Call if worsening symptoms or severe abdominal pain, weakness, fatigue, dizziness, passing out, shortness of breath  Drink plenty of fluids to stay hydrated.

## 2019-03-27 NOTE — Telephone Encounter (Signed)
Pt had colonoscopy on 8/5 by RMR. She said last night she had diarrhea and felt "something dropped" and she was oozing stool during the night. She has OV with EG on 9/16 at 230pm. Please advise. [phone]

## 2019-03-27 NOTE — Addendum Note (Signed)
Addended by: Gordy Levan, ERIC A on: 03/27/2019 01:58 PM   Modules accepted: Orders

## 2019-03-28 ENCOUNTER — Ambulatory Visit (HOSPITAL_COMMUNITY)
Admission: RE | Admit: 2019-03-28 | Discharge: 2019-03-28 | Disposition: A | Discharge status: Home / Self Care | Payer: Medicare HMO | Source: Ambulatory Visit | Attending: Internal Medicine | Admitting: Internal Medicine

## 2019-03-28 ENCOUNTER — Other Ambulatory Visit: Payer: Self-pay

## 2019-03-28 ENCOUNTER — Other Ambulatory Visit (HOSPITAL_COMMUNITY): Payer: Self-pay | Admitting: Internal Medicine

## 2019-03-28 ENCOUNTER — Other Ambulatory Visit: Payer: Self-pay | Admitting: Internal Medicine

## 2019-03-28 DIAGNOSIS — S46012A Strain of muscle(s) and tendon(s) of the rotator cuff of left shoulder, initial encounter: Secondary | ICD-10-CM | POA: Diagnosis not present

## 2019-03-28 DIAGNOSIS — M545 Low back pain: Secondary | ICD-10-CM | POA: Diagnosis not present

## 2019-03-28 DIAGNOSIS — R1084 Generalized abdominal pain: Secondary | ICD-10-CM | POA: Diagnosis not present

## 2019-03-28 DIAGNOSIS — R197 Diarrhea, unspecified: Secondary | ICD-10-CM | POA: Diagnosis not present

## 2019-03-28 DIAGNOSIS — E559 Vitamin D deficiency, unspecified: Secondary | ICD-10-CM | POA: Diagnosis not present

## 2019-03-28 DIAGNOSIS — L299 Pruritus, unspecified: Secondary | ICD-10-CM | POA: Diagnosis not present

## 2019-03-28 DIAGNOSIS — L03314 Cellulitis of groin: Secondary | ICD-10-CM | POA: Diagnosis not present

## 2019-03-28 DIAGNOSIS — H65199 Other acute nonsuppurative otitis media, unspecified ear: Secondary | ICD-10-CM | POA: Diagnosis not present

## 2019-03-28 DIAGNOSIS — J305 Allergic rhinitis due to food: Secondary | ICD-10-CM | POA: Diagnosis not present

## 2019-03-28 DIAGNOSIS — K7689 Other specified diseases of liver: Secondary | ICD-10-CM | POA: Diagnosis not present

## 2019-03-28 DIAGNOSIS — R69 Illness, unspecified: Secondary | ICD-10-CM | POA: Diagnosis not present

## 2019-03-28 DIAGNOSIS — J019 Acute sinusitis, unspecified: Secondary | ICD-10-CM | POA: Diagnosis not present

## 2019-03-28 LAB — CBC WITH DIFFERENTIAL/PLATELET
Absolute Monocytes: 788 cells/uL (ref 200–950)
Basophils Absolute: 25 cells/uL (ref 0–200)
Basophils Relative: 0.2 %
Eosinophils Absolute: 188 cells/uL (ref 15–500)
Eosinophils Relative: 1.5 %
HCT: 44.9 % (ref 35.0–45.0)
Hemoglobin: 15 g/dL (ref 11.7–15.5)
Lymphs Abs: 2688 cells/uL (ref 850–3900)
MCH: 30.4 pg (ref 27.0–33.0)
MCHC: 33.4 g/dL (ref 32.0–36.0)
MCV: 90.9 fL (ref 80.0–100.0)
MPV: 10 fL (ref 7.5–12.5)
Monocytes Relative: 6.3 %
Neutro Abs: 8813 cells/uL — ABNORMAL HIGH (ref 1500–7800)
Neutrophils Relative %: 70.5 %
Platelets: 259 10*3/uL (ref 140–400)
RBC: 4.94 10*6/uL (ref 3.80–5.10)
RDW: 12.2 % (ref 11.0–15.0)
Total Lymphocyte: 21.5 %
WBC: 12.5 10*3/uL — ABNORMAL HIGH (ref 3.8–10.8)

## 2019-03-28 MED ORDER — IOHEXOL 300 MG/ML  SOLN
100.0000 mL | Freq: Once | INTRAMUSCULAR | Status: AC | PRN
  Administered 2019-03-28: 100 mL via INTRAVENOUS

## 2019-03-29 DIAGNOSIS — E559 Vitamin D deficiency, unspecified: Secondary | ICD-10-CM | POA: Diagnosis not present

## 2019-03-29 DIAGNOSIS — H65199 Other acute nonsuppurative otitis media, unspecified ear: Secondary | ICD-10-CM | POA: Diagnosis not present

## 2019-03-29 DIAGNOSIS — R69 Illness, unspecified: Secondary | ICD-10-CM | POA: Diagnosis not present

## 2019-03-29 DIAGNOSIS — J305 Allergic rhinitis due to food: Secondary | ICD-10-CM | POA: Diagnosis not present

## 2019-03-29 DIAGNOSIS — L299 Pruritus, unspecified: Secondary | ICD-10-CM | POA: Diagnosis not present

## 2019-03-29 DIAGNOSIS — J019 Acute sinusitis, unspecified: Secondary | ICD-10-CM | POA: Diagnosis not present

## 2019-03-29 DIAGNOSIS — R197 Diarrhea, unspecified: Secondary | ICD-10-CM | POA: Diagnosis not present

## 2019-03-29 DIAGNOSIS — M545 Low back pain: Secondary | ICD-10-CM | POA: Diagnosis not present

## 2019-03-29 DIAGNOSIS — S46012A Strain of muscle(s) and tendon(s) of the rotator cuff of left shoulder, initial encounter: Secondary | ICD-10-CM | POA: Diagnosis not present

## 2019-03-29 DIAGNOSIS — L03314 Cellulitis of groin: Secondary | ICD-10-CM | POA: Diagnosis not present

## 2019-03-29 LAB — POCT I-STAT CREATININE: Creatinine, Ser: 0.8 mg/dL (ref 0.44–1.00)

## 2019-04-01 LAB — C. DIFFICILE GDH AND TOXIN A/B
GDH ANTIGEN: DETECTED
MICRO NUMBER:: 823538
SPECIMEN QUALITY:: ADEQUATE
TOXIN A AND B: NOT DETECTED

## 2019-04-01 LAB — GASTROINTESTINAL PATHOGEN PANEL PCR
C. difficile Tox A/B, PCR: NOT DETECTED
Campylobacter, PCR: NOT DETECTED
Cryptosporidium, PCR: NOT DETECTED
E coli (ETEC) LT/ST PCR: NOT DETECTED
E coli (STEC) stx1/stx2, PCR: NOT DETECTED
E coli 0157, PCR: NOT DETECTED
Giardia lamblia, PCR: NOT DETECTED
Norovirus, PCR: NOT DETECTED
Rotavirus A, PCR: NOT DETECTED
Salmonella, PCR: NOT DETECTED
Shigella, PCR: NOT DETECTED

## 2019-04-01 LAB — CLOSTRIDIUM DIFFICILE TOXIN B, QUALITATIVE, REAL-TIME PCR: Toxigenic C. Difficile by PCR: NOT DETECTED

## 2019-04-03 DIAGNOSIS — K210 Gastro-esophageal reflux disease with esophagitis: Secondary | ICD-10-CM | POA: Diagnosis not present

## 2019-04-03 DIAGNOSIS — R69 Illness, unspecified: Secondary | ICD-10-CM | POA: Diagnosis not present

## 2019-04-17 ENCOUNTER — Ambulatory Visit: Payer: Medicare HMO | Admitting: Nurse Practitioner

## 2019-04-17 ENCOUNTER — Telehealth: Payer: Self-pay

## 2019-04-17 ENCOUNTER — Other Ambulatory Visit: Payer: Self-pay

## 2019-04-17 ENCOUNTER — Encounter: Payer: Self-pay | Admitting: Nurse Practitioner

## 2019-04-17 VITALS — BP 110/75 | HR 90 | Temp 96.8°F | Ht 64.0 in | Wt 151.4 lb

## 2019-04-17 DIAGNOSIS — K5792 Diverticulitis of intestine, part unspecified, without perforation or abscess without bleeding: Secondary | ICD-10-CM

## 2019-04-17 DIAGNOSIS — R197 Diarrhea, unspecified: Secondary | ICD-10-CM

## 2019-04-17 DIAGNOSIS — R1030 Lower abdominal pain, unspecified: Secondary | ICD-10-CM

## 2019-04-17 DIAGNOSIS — R112 Nausea with vomiting, unspecified: Secondary | ICD-10-CM

## 2019-04-17 LAB — COMPREHENSIVE METABOLIC PANEL
AG Ratio: 1.7 (calc) (ref 1.0–2.5)
ALT: 14 U/L (ref 6–29)
AST: 15 U/L (ref 10–35)
Albumin: 3.7 g/dL (ref 3.6–5.1)
Alkaline phosphatase (APISO): 71 U/L (ref 37–153)
BUN/Creatinine Ratio: 15 (calc) (ref 6–22)
BUN: 14 mg/dL (ref 7–25)
CO2: 30 mmol/L (ref 20–32)
Calcium: 9 mg/dL (ref 8.6–10.4)
Chloride: 105 mmol/L (ref 98–110)
Creat: 0.96 mg/dL — ABNORMAL HIGH (ref 0.60–0.93)
Globulin: 2.2 g/dL (calc) (ref 1.9–3.7)
Glucose, Bld: 78 mg/dL (ref 65–139)
Potassium: 3.3 mmol/L — ABNORMAL LOW (ref 3.5–5.3)
Sodium: 144 mmol/L (ref 135–146)
Total Bilirubin: 0.6 mg/dL (ref 0.2–1.2)
Total Protein: 5.9 g/dL — ABNORMAL LOW (ref 6.1–8.1)

## 2019-04-17 LAB — CBC WITH DIFFERENTIAL/PLATELET
Absolute Monocytes: 605 cells/uL (ref 200–950)
Basophils Absolute: 19 cells/uL (ref 0–200)
Basophils Relative: 0.2 %
Eosinophils Absolute: 214 cells/uL (ref 15–500)
Eosinophils Relative: 2.3 %
HCT: 46.4 % — ABNORMAL HIGH (ref 35.0–45.0)
Hemoglobin: 15.1 g/dL (ref 11.7–15.5)
Lymphs Abs: 2288 cells/uL (ref 850–3900)
MCH: 30 pg (ref 27.0–33.0)
MCHC: 32.5 g/dL (ref 32.0–36.0)
MCV: 92.1 fL (ref 80.0–100.0)
MPV: 10.4 fL (ref 7.5–12.5)
Monocytes Relative: 6.5 %
Neutro Abs: 6175 cells/uL (ref 1500–7800)
Neutrophils Relative %: 66.4 %
Platelets: 291 10*3/uL (ref 140–400)
RBC: 5.04 10*6/uL (ref 3.80–5.10)
RDW: 12.8 % (ref 11.0–15.0)
Total Lymphocyte: 24.6 %
WBC: 9.3 10*3/uL (ref 3.8–10.8)

## 2019-04-17 LAB — LIPASE: Lipase: 19 U/L (ref 7–60)

## 2019-04-17 NOTE — Assessment & Plan Note (Signed)
At baseline she has postprandial diarrhea associated with abdominal pain that resolves after her bowel movement.  Recently her abdominal pain has become constant and severe like it was 3 weeks ago when she had diverticulitis.  She had multiple watery stools and this morning.  I am concerned she has reviewed diverticulitis.  Further work-up as per below.  Further recommendations to follow.  Return for follow-up in 2 to 4 weeks.  ER precautions given.

## 2019-04-17 NOTE — Assessment & Plan Note (Signed)
About 3 weeks ago the patient was having worsening abdominal pain.  She went to the hospital for CT scan which documented acute diverticulitis.  She was treated with Cipro and Flagyl.  Due to taking her husband to dialysis 3 days a week there were several days where she could not take her antibiotics at the prescribed frequency.  She initially did well after her antibiotics.  However, in the past couple days she started having nearly identical, severe symptoms including mid abdominal, lower abdominal, left lower abdominal pain along with significant weakness and fatigue, diarrhea.  No sick contacts, recent travel, spoiled food.  When asked she affirms that her symptoms are nearly identical to when she had diverticulitis a few weeks ago and is worried she has it again.  I share her concern.  At this point we will check stat labs including CBC, CMP, lipase.  I will also put in for stat repeat CT of the abdomen and pelvis.  Further recommendations to follow.  Follow-up in 2 to 4 weeks otherwise.

## 2019-04-17 NOTE — Patient Instructions (Signed)
Your health issues we discussed today were:   Worsening abdominal pain and diarrhea with recent diverticulitis: 1. I am putting in for stat labs.  Have these done when you leave our office 2. I am ordering a stat CT of your abdomen and pelvis to evaluate for recurrent diverticulitis or worsening/complication of your previous diverticulitis 3. Further recommendations will follow 4. If you have any worsening abdominal pain, nausea and vomiting with inability to keep down food and fluids, high fever, rectal bleeding proceed to the emergency department.  Overall I recommend:  1. Continue your other current medications 2. Return for follow-up in 2 to 4 weeks 3. Call us again any questions or concerns   Because of recent events of COVID-19 ("Coronavirus"), follow CDC recommendations:  1. Wash your hand frequently 2. Avoid touching your face 3. Stay away from people who are sick 4. If you have symptoms such as fever, cough, shortness of breath then call your healthcare provider for further guidance 5. If you are sick, STAY AT HOME unless otherwise directed by your healthcare provider. 6. Follow directions from state and national officials regarding staying safe   At [facility] we value your feedback. You may receive a survey about your visit today. Please share your experience as we strive to create trusting relationships with our patients to provide genuine, compassionate, quality care.  We appreciate your understanding and patience as we review any laboratory studies, imaging, and other diagnostic tests that are ordered as we care for you. Our office policy is 5 business days for review of these results, and any emergent or urgent results are addressed in a timely manner for your best interest. If you do not hear from our office in 1 week, please contact us.   We also encourage the use of MyChart, which contains your medical information for your review as well. If you are not  enrolled in this feature, an access code is on this after visit summary for your convenience. Thank you for allowing [place] to be involved in your care.  It was great to see you today!  I hope you have a great Fall!!

## 2019-04-17 NOTE — Progress Notes (Signed)
Referring Provider: Celene Squibb, MD Primary Care Physician:  Celene Squibb, MD Primary GI:  Dr. Gala Romney  Chief Complaint  Patient presents with  . Diarrhea    daily 3-4 times a day, had 7-8 episodes last night  . Nausea    occas  . Abdominal Pain    all over, constant pain    HPI:   Jennifer [name] is a 73 y.o. female who presents for follow-up on diarrhea and history of colon polyps.  Patient was last seen in our office 02/11/2019 for the same as well as lower abdominal pain.  Noted chronic history of dilated common bile duct and chronic diarrhea.  History of lymphocytic colitis in 2017 and treated with Entocort.  Most recent colonoscopy in 2017 with 3 polyps and status post segmental biopsies.  Surgical pathology found tubular adenoma polyps and lymphocytic colitis on biopsies.  Recommended course of Entocort and repeat colonoscopy in 3 years.  At her last visit she was doing okay, still having diarrhea.  Her primary care gave her another prescription for Entocort 9 mg for 2 weeks and she is currently on 6 mg for 2 weeks.  Having variable number of stools with anywhere from 0-6 bowel movements.  Typically postprandial bowel movements.  Some mid to lower abdominal pain with her diarrhea that resolves after a bowel movement.  Intermittent hemorrhoid discomfort until the tissue hematochezia.  No other GI complaints.  Recommended update colonoscopy, finished Entocort to completion, Bentyl 10 mg up to 4 times a day as needed, follow-up in 2 months.  Colonoscopy completed 03/06/2019 which found two 5 to 6 mm polyps at the ileocecal valve status post removal, diverticulosis in the sigmoid colon and descending colon, otherwise normal.  Surgical pathology, polyps to be tubular adenoma and recommended repeat colonoscopy in 5 years (2025).  Patient called our office 03/27/2019 indicating diarrhea and severe sharp pain in her generalized abdomen with 11-12 stools after recent amoxicillin for possible sinus  infection.  Also on prednisone which is ongoing.  Recommended CBC, C. difficile, GI path, iFOBT, call for any severe worsening symptoms, continue fluids and stay hydrated.   Labs completed 03/28/2019 which found normal stool studies.  Requested if she is still having diarrhea [place] we can send in a medication versus over-the-counter Imodium.  Patient indicated she saw her primary care and underwent CT scan which showed diverticulitis and is treated with Cipro twice daily for 7 days.  She admits she only took the medication once a day on most days and is still having pain with vomiting little bit better and loose stools twice daily with Imodium not helping.  Today she states she's not doing great. She was on Cipro/Flagyl but couldn't take as recommended on several days because she had to take her husband to dialysis. Pain improved initially on antibiotics. Pain worsening yesterday and is constant, generalized abdomen. Having significant weakness as well. When she had diverticulitis her pain was the same quality and location. Did have N/V the first night of diverticulitis, none since her symptoms started yesterday. Denies hematochezia, melena. Denies fever, chills. Still having significant diarrhea which has worsened in the past couple days, was pure water this morning. Denies sick contacts, travel, "bad" food. Overall her diarrhea and pain seems different than her baseline. Denies alcohol consumption. Denies chest pain, dyspnea, dizziness, lightheadedness, syncope, near syncope. Denies any other upper or lower GI symptoms. Denies chest pain, dyspnea, dizziness, lightheadedness, syncope, near syncope. Denies any other upper  or lower GI symptoms.  Having worsening hemorrhoid symptoms with diarrhea. Using Tucks pads which helps.  She is still on prednisone for 2 more days for sinus problems.  Past Medical History:  Diagnosis Date  . Depression   . GERD (gastroesophageal reflux disease)   . History of colon  polyps    remote past, unable to retrieve records   . Hyperlipidemia   . Hypothyroidism   . Rheumatoid arthritis [facility])     Past Surgical History:  Procedure Laterality Date  . COLONOSCOPY     last one was about 3 yrs in [place]  . COLONOSCOPY N/A 02/19/2016   Dr. Gala Romney: three 3-6 mm polyps (tubular adenomas) in ascending colon, segmental biopsies consistent with lymphocytic colitis. Prescribed entocort.   . COLONOSCOPY N/A 03/06/2019   Procedure: COLONOSCOPY;  Surgeon: Daneil Dolin, MD;  Location: [facility];  Service: Endoscopy;  Laterality: N/A;  12:00pm  . ESOPHAGOGASTRODUODENOSCOPY N/A 08/11/2014   RMR: MIld erosive  reflux esophagitis. small hiatal hernia. Abnormal gastric muocsa uncertain significance. Subtly abnormal duodeanl (bulbar) mucosa- status post multiple biopsies. BENIGN small bowel mucosa, no evidence of villous blulnting, mild reactive gastropathy on stomach biopsy   . GALLBLADDER SURGERY    . LEG SURGERY     right  . PARTIAL HYSTERECTOMY      Current Outpatient Medications  Medication Sig Dispense Refill  . albuterol (VENTOLIN HFA) 108 (90 Base) MCG/ACT inhaler Inhale 2 puffs into the lungs every 6 (six) hours as needed for wheezing or shortness of breath.    [name] ELLIPTA 62.5-25 MCG/INH AEPB Inhale 1 puff into the lungs daily.    . Calcium Carb-Cholecalciferol (CALCIUM 600 + D PO) Take 2 tablets by mouth daily.    Marland Kitchen Dextromethorphan-guaiFENesin (MUCINEX DM MAXIMUM STRENGTH) 60-1200 MG TB12 Take 1 tablet by mouth as needed.     . dicyclomine (BENTYL) 10 MG capsule Take 1 capsule (10 mg total) by mouth 4 (four) times daily as needed for spasms. 90 capsule 3  . fluticasone (FLONASE) 50 MCG/ACT nasal spray Place 2 sprays into both nostrils 2 (two) times daily as needed for allergies.   1  . hydrOXYzine (ATARAX/VISTARIL) 25 MG tablet Take 25 mg by mouth every 8 (eight) hours.    Marland Kitchen levothyroxine (SYNTHROID, LEVOTHROID) 100 MCG tablet Take 100 mcg by mouth  daily before breakfast.    . Magnesium 400 MG CAPS Take 800 mg by mouth at bedtime.    . methocarbamol (ROBAXIN) 500 MG tablet Take 500 mg by mouth every 6 (six) hours as needed for muscle spasms.    . mirabegron ER (MYRBETRIQ) 25 MG TB24 tablet Take 25 mg by mouth at bedtime.    . pantoprazole (PROTONIX) 40 MG tablet TAKE ONE TABLET BY MOUTH ONCE DAILY BEFORE BREAKFAST. (Patient taking differently: Take 40 mg by mouth daily. ) 90 tablet 3  . simvastatin (ZOCOR) 40 MG tablet Take 40 mg by mouth every evening.    . triamcinolone cream (KENALOG) 0.1 % Apply 1 application topically 4 (four) times daily as needed for itching.     No current facility-administered medications for this visit.     Allergies as of 04/17/2019 - Review Complete 04/17/2019  Allergen Reaction Noted  . Desyrel [trazodone]  07/14/2014  . Keflex [cephalexin]  07/14/2014  . Klonopin [clonazepam]  07/14/2014  . Minocin [minocycline hcl]  07/14/2014  . Stelazine [trifluoperazine]  07/14/2014  . Viberzi [eluxadoline]  03/31/2015    Family History  Problem Relation Age  of Onset  . Colon cancer Neg Hx     Social History   Socioeconomic History  . Marital status: Married    Spouse name: Not on file  . Number of children: 2  . Years of education: Not on file  . Highest education level: Not on file  Occupational History  . Occupation: disability  Social Needs  . Financial resource strain: Not on file  . Food insecurity    Worry: Not on file    Inability: Not on file  . Transportation needs    Medical: Not on file    Non-medical: Not on file  Tobacco Use  . Smoking status: Current Every Day Smoker    Packs/day: 0.25    Types: Cigarettes    Last attempt to quit: 03/31/2018    Years since quitting: 1.0  . Smokeless tobacco: Never Used  . Tobacco comment: quit 03/31/18  Substance and Sexual Activity  . Alcohol use: No    Alcohol/week: 0.0 standard drinks  . Drug use: No  . Sexual activity: Not on file   Lifestyle  . Physical activity    Days per week: Not on file    Minutes per session: Not on file  . Stress: Not on file  Relationships  . Social Herbalist on phone: Not on file    Gets together: Not on file    Attends religious service: Not on file    Active member of club or organization: Not on file    Attends meetings of clubs or organizations: Not on file    Relationship status: Not on file  Other Topics Concern  . Not on file  Social History Narrative  . Not on file    Review of Systems: General: Negative for anorexia, weight loss, fever, chills. Admits weakness and fatigue. ENT: Negative for hoarseness, difficulty swallowing. CV: Negative for chest pain, angina, palpitations, peripheral edema.  Respiratory: Negative for dyspnea at rest, cough, sputum, wheezing.  GI: See history of present illness. Endo: Negative for unusual weight change.  Heme: Negative for bruising or bleeding. Allergy: Negative for rash or hives.   Physical Exam: BP 110/75   Pulse 90   Temp (!) 96.8 F (36 C) (Oral)   Ht 5\' 4"  (1.626 m)   Wt 151 lb 6.4 oz (68.7 kg)   BMI 25.99 kg/m  General:   Alert and oriented. Pleasant and cooperative. Well-nourished and well-developed. Appears in pain. Eyes:  Without icterus, sclera clear and conjunctiva pink.  Ears:  Normal auditory acuity. Cardiovascular:  S1, S2 present without murmurs appreciated. Extremities without clubbing or edema. Respiratory:  Clear to auscultation bilaterally. No wheezes, rales, or rhonchi. No distress.  Gastrointestinal:  +BS, soft, non-tender and non-distended. No HSM noted. No guarding or rebound. No masses appreciated.  Rectal:  Deferred  Musculoskalatal:  Symmetrical without gross deformities. Neurologic:  Alert and oriented x4;  grossly normal neurologically. Psych:  Alert and cooperative. Normal mood and affect. Heme/Lymph/Immune: No excessive bruising noted.    04/17/2019 2:47 PM   Disclaimer: This  note was dictated with voice recognition software. Similar sounding words can inadvertently be transcribed and may not be corrected upon review.

## 2019-04-17 NOTE — Telephone Encounter (Signed)
PA for CT abd/pelvis w/contrast submitted via [facility]. Case went to clinical review. Service order: [id number]. Clinical notes faxed to [place].

## 2019-04-18 NOTE — Telephone Encounter (Signed)
Ms. Orvan Seen from [facility] called office re: pending PA for CT. She didn't see CT scan report from 03/28/19 that was faxed yesterday. Case to be furthered reviewed.

## 2019-04-18 NOTE — Telephone Encounter (Signed)
Received fax from [place], [place] approved. PA# [id number], valid 04/18/19-10/15/19.

## 2019-04-18 NOTE — Telephone Encounter (Signed)
Tried to call pt to discuss scheduling CT, no answer, LMOVM for return call ASAP.

## 2019-04-18 NOTE — Telephone Encounter (Signed)
LMOVM for pt to call back 

## 2019-04-18 NOTE — Telephone Encounter (Signed)
Tried to call pt, no answer 

## 2019-04-19 ENCOUNTER — Encounter (HOSPITAL_COMMUNITY): Payer: Self-pay

## 2019-04-19 ENCOUNTER — Other Ambulatory Visit: Payer: Self-pay

## 2019-04-19 ENCOUNTER — Ambulatory Visit (HOSPITAL_COMMUNITY)
Admission: RE | Admit: 2019-04-19 | Discharge: 2019-04-19 | Disposition: A | Discharge status: Home / Self Care | Payer: Medicare HMO | Source: Ambulatory Visit | Attending: Nurse Practitioner | Admitting: Nurse Practitioner

## 2019-04-19 DIAGNOSIS — K5792 Diverticulitis of intestine, part unspecified, without perforation or abscess without bleeding: Secondary | ICD-10-CM | POA: Insufficient documentation

## 2019-04-19 DIAGNOSIS — R112 Nausea with vomiting, unspecified: Secondary | ICD-10-CM | POA: Insufficient documentation

## 2019-04-19 DIAGNOSIS — R197 Diarrhea, unspecified: Secondary | ICD-10-CM | POA: Insufficient documentation

## 2019-04-19 DIAGNOSIS — R1030 Lower abdominal pain, unspecified: Secondary | ICD-10-CM | POA: Diagnosis present

## 2019-04-19 DIAGNOSIS — K5730 Diverticulosis of large intestine without perforation or abscess without bleeding: Secondary | ICD-10-CM | POA: Diagnosis not present

## 2019-04-19 MED ORDER — IOHEXOL 300 MG/ML  SOLN
100.0000 mL | Freq: Once | INTRAMUSCULAR | Status: AC | PRN
  Administered 2019-04-19: 100 mL via INTRAVENOUS

## 2019-04-19 NOTE — Telephone Encounter (Signed)
LMOVM for pt 

## 2019-04-19 NOTE — Telephone Encounter (Signed)
Patient called back. She last had something to drink around 8am.  [facility] scheduling and was advised to come now. Called patient back and she is aware

## 2019-04-21 NOTE — Progress Notes (Signed)
CC'ED TO PCP 

## 2019-04-22 ENCOUNTER — Telehealth: Payer: Self-pay | Admitting: *Deleted

## 2019-04-22 MED ORDER — HYDROCORTISONE (PERIANAL) 2.5 % EX CREA: 1.0000 "application " | TOPICAL_CREAM | Freq: Two times a day (BID) | CUTANEOUS | 1 refills | Status: DC

## 2019-04-22 NOTE — Telephone Encounter (Signed)
Left a detailed message for pt. Pt was asked to pick up Anusol from [place]. Pt is aware that Entocort decisions/ refill will be discuss with EG.

## 2019-04-22 NOTE — Addendum Note (Signed)
Addended by: Annitta Needs on: 04/22/2019 01:44 PM   Modules accepted: Orders

## 2019-04-22 NOTE — Telephone Encounter (Signed)
I briefly reviewed labs. Potassium mildly below normal at 3.3. CT with resolution of previously seen diverticulitis.   She may take tylenol for discomfort. I have sent in Anusol cream. Stool studies late Aug 2020 normal.   Further recommendations and decision about Entocort per Randall Hiss when he returns tomorrow, 9/22.

## 2019-04-22 NOTE — Telephone Encounter (Signed)
Routing to rounding APP.

## 2019-04-22 NOTE — Telephone Encounter (Signed)
Pt was seen last week with a HX of diarrhea. Pt has previously been taking Entocort and Bentyl. Pt states that since Jennifer [name] left our office on Wednesday 04/17/2019, Jennifer [name] hasn't had a bowel movement until this Monday. Pt has had three bowel movements in the form of diarrhea this morning and feels like when Jennifer [name] stands up her insides are falling out of her rectum. Lower abdominal pain present when Jennifer [name] has to have a bowel movement. Pt is using oct hemorrhoid cream and tucks pads and continues to be raw, swollen and inflamed in her rectum. Pt needs a refill of the Entocort since Jennifer [name] is out and pt isn't sure if Jennifer [name] can take Ibuprofen for the pain Jennifer [name]'s having in her rectum.   Please advise in the absence of EG.

## 2019-04-22 NOTE — Telephone Encounter (Signed)
Pt says hemorrhoids have prolapsed on her.  Says that they are really swollen.  Wants to be worked in today.  Pt also wants results of CT scan and blood work results.

## 2019-04-24 ENCOUNTER — Telehealth: Payer: Self-pay | Admitting: Internal Medicine

## 2019-04-24 DIAGNOSIS — K648 Other hemorrhoids: Secondary | ICD-10-CM | POA: Diagnosis not present

## 2019-04-24 DIAGNOSIS — K6289 Other specified diseases of anus and rectum: Secondary | ICD-10-CM | POA: Diagnosis not present

## 2019-04-24 NOTE — Telephone Encounter (Signed)
Pt called his morning and said the Anusol Cr from [place] isn't helping the pain in her rectum. Pt is also taking Ibuprofen with no relief. Pt also says that when she stands up, there is so much pressure and it feels like she needs to have a bowel movement. When pt goes to sit on the toilet, only mucous comes out. When pt lays down on her lt side, she's able to have a bowel movement and isn't able to stop the bowel movement from coming. Please advise.

## 2019-04-24 NOTE — Telephone Encounter (Signed)
Patient already was scheduled for appointment on 10/08, does that appointment need to be cancelled?

## 2019-04-24 NOTE — Telephone Encounter (Signed)
Pt notified of EG's recommendations.  Scheduled apt with [facility] 05/07/2019 to discuss rectal pressure/pain/diarrhea. Please place pt on a cancellation list.

## 2019-04-24 NOTE — Telephone Encounter (Signed)
Please call patient. [phone] She said the hydrocortisone cream isn't working.

## 2019-04-24 NOTE — Telephone Encounter (Signed)
No indication to refill Entocort. This is a steroid and was given to treat lymphocytic colitis. Her recent colonoscopy looked normal and no indication for ongoing treatment. Diverticulitis is resolved. Based on her description it seems like she's going days with no bowel movement and then having spurrious diarrhea. Likely straining causing hemorrhoid issues. I would hold the Bentyl for now and see if this helps her have more regular bowel movements. She can also start OTC fiber supplement (such as benefiber).  Let's go ahead and schedule her for an OV (first available with any APP) as there's a lot going on with multiple phone messages. If she's having significant hemorrhoids not amendable to topical therapy, there may be a role for hemorrhoid banding vs. Hemorrhoidectomy.

## 2019-04-24 NOTE — Telephone Encounter (Signed)
Thank you. I cancelled apt. For 05/09/2019.

## 2019-05-02 DIAGNOSIS — R69 Illness, unspecified: Secondary | ICD-10-CM | POA: Diagnosis not present

## 2019-05-07 ENCOUNTER — Ambulatory Visit: Payer: Medicare HMO | Admitting: Gastroenterology

## 2019-05-07 ENCOUNTER — Encounter: Payer: Self-pay | Admitting: Gastroenterology

## 2019-05-07 ENCOUNTER — Other Ambulatory Visit: Payer: Self-pay

## 2019-05-07 VITALS — BP 130/81 | HR 71 | Temp 97.0°F | Ht 64.0 in | Wt 151.0 lb

## 2019-05-07 DIAGNOSIS — K52832 Lymphocytic colitis: Secondary | ICD-10-CM | POA: Diagnosis not present

## 2019-05-07 DIAGNOSIS — K649 Unspecified hemorrhoids: Secondary | ICD-10-CM

## 2019-05-07 MED ORDER — BUDESONIDE 3 MG PO CPEP: 9.0000 mg | ORAL_CAPSULE | Freq: Every day | ORAL | 1 refills | Status: DC

## 2019-05-07 NOTE — Patient Instructions (Signed)
1. Start Entocort 9mg  daily.  CALL IN 2 WEEKS AND LET ME KNOW HOW YOU ARE DOING AND WE WILL DECIDE WHEN TO START TAPER. 2. Continue hemorrhoid creams as needed. No more than 2 weeks straight at a time. 3. Return office visit in 8 weeks.

## 2019-05-07 NOTE — Assessment & Plan Note (Signed)
Recent flare of hemorrhoid disease.  Currently doing better.  Suspect internal and external disease.  Patient not interested and surgical repair.  Continue hemorrhoid creams as needed, no more than 2 weeks at a time.  Hopefully will improve with regards to flares once we have her diarrhea under control.  Return to the office in 8 weeks.

## 2019-05-07 NOTE — Progress Notes (Signed)
Primary Care Physician: Celene Squibb, MD  Primary Gastroenterologist:  Garfield Cornea, MD   Chief Complaint  Patient presents with  . rectal pressure    reports hemorrhoid was out but pushed back in    HPI: Jennifer [name] is a 73 y.o. female here for further evaluation of rectal pressure.  She was seen in the office on 04/17/2019.  History of lymphocytic colitis diagnosed in 2017, previously treated with Entocort.  Last colonoscopy on March 06, 2019, 2 polyps removed (tubular adenomas), diverticulosis. NO RANDOM BIOPSIES PERFORMED. Next colonoscopy in 5 years.  August 28 she completed stool studies.  C. difficile GDH was detected but toxin A+B was not detected.  Toxigenic C. difficile PCR not detected.  GI pathogen panel was negative.    CT abdomen and pelvis with contrast on March 28, 2019 for abdominal pain showed mild apparent inflammation adjacent to the distal sigmoid colon suggestive of acute diverticulitis.  Unchanged intrahepatic and CBD dilatation from March 2019.  Patient was treated with Cipro and Flagyl although she was not very consistent with taking it.  When she was seen in September she was still having abdominal pain therefore repeat CT was performed.  Interval resolution of previously seen sigmoid diverticulitis with no acute diverticulitis seen.  Still having diarrhea, all stools watery, 8-9 times per day. If hits bump on the road or coughs, will have fecal incontinence. Some nocturnal incontinence. No solid stools. Handful of stool at a time. Hemorrhoid came out, very irritated and swollen started in August. Kind of like plugged her up for few days and couldn't have a BM. That's when she started with lower stomach pain, then sent for CT and diagnosed with diverticulitis. Bentyl was stopped at this time due to not being able to have a stool for 3 days. Only took one. Using internal and external hemorrhoid creams. Finally rectal pain started to improve. Was told she had 5  hemorrhoids. Appetite ok. No reflux symptoms on pantoprazole.      Current Outpatient Medications  Medication Sig Dispense Refill  . albuterol (VENTOLIN HFA) 108 (90 Base) MCG/ACT inhaler Inhale 2 puffs into the lungs every 6 (six) hours as needed for wheezing or shortness of breath.    Jennifer [name] ELLIPTA 62.5-25 MCG/INH AEPB Inhale 1 puff into the lungs daily.    . Calcium Carb-Cholecalciferol (CALCIUM 600 + D PO) Take 2 tablets by mouth daily.    Marland Kitchen Dextromethorphan-guaiFENesin (MUCINEX DM MAXIMUM STRENGTH) 60-1200 MG TB12 Take 1 tablet by mouth as needed.     . dicyclomine (BENTYL) 10 MG capsule Take 1 capsule (10 mg total) by mouth 4 (four) times daily as needed for spasms. 90 capsule 3  . fluticasone (FLONASE) 50 MCG/ACT nasal spray Place 2 sprays into both nostrils 2 (two) times daily as needed for allergies.   1  . HYDROcodone-acetaminophen (NORCO/VICODIN) 5-325 MG tablet Take 1 tablet by mouth every 6 (six) hours as needed for moderate pain.    . hydrocortisone (ANUSOL-HC) 2.5 % rectal cream Place 1 application rectally 2 (two) times daily. (Patient taking differently: Place 1 application rectally as needed. ) 30 g 1  . hydrOXYzine (ATARAX/VISTARIL) 25 MG tablet Take 25 mg by mouth every 8 (eight) hours.    Marland Kitchen levothyroxine (SYNTHROID, LEVOTHROID) 100 MCG tablet Take 100 mcg by mouth daily before breakfast.    . Magnesium 400 MG CAPS Take 800 mg by mouth at bedtime.    . methocarbamol (ROBAXIN) 500 MG  tablet Take 500 mg by mouth every 6 (six) hours as needed for muscle spasms.    . mirabegron ER (MYRBETRIQ) 25 MG TB24 tablet Take 25 mg by mouth at bedtime.    . pantoprazole (PROTONIX) 40 MG tablet TAKE ONE TABLET BY MOUTH ONCE DAILY BEFORE BREAKFAST. (Patient taking differently: Take 40 mg by mouth daily. ) 90 tablet 3  . simvastatin (ZOCOR) 40 MG tablet Take 40 mg by mouth every evening.    . triamcinolone cream (KENALOG) 0.1 % Apply 1 application topically 4 (four) times daily as needed for  itching.     No current facility-administered medications for this visit.     Allergies as of 05/07/2019 - Review Complete 05/07/2019  Allergen Reaction Noted  . Desyrel [trazodone]  07/14/2014  . Keflex [cephalexin]  07/14/2014  . Klonopin [clonazepam]  07/14/2014  . Minocin [minocycline hcl]  07/14/2014  . Stelazine [trifluoperazine]  07/14/2014  . Viberzi [eluxadoline]  03/31/2015    ROS:  General: Negative for anorexia, weight loss, fever, chills, fatigue, weakness. ENT: Negative for hoarseness, difficulty swallowing , nasal congestion. CV: Negative for chest pain, angina, palpitations, dyspnea on exertion, peripheral edema.  Respiratory: Negative for dyspnea at rest, dyspnea on exertion, cough, sputum, wheezing.  GI: See history of present illness. GU:  Negative for dysuria, hematuria, urinary incontinence, urinary frequency, nocturnal urination.  Endo: Negative for unusual weight change.    Physical Examination:   BP 130/81   Pulse 71   Temp (!) 97 F (36.1 C) (Oral)   Ht 5\' 4"  (1.626 m)   Wt 151 lb (68.5 kg)   BMI 25.92 kg/m   General: Well-nourished, well-developed in no acute distress.  Eyes: No icterus. Mouth: Oropharyngeal mucosa moist and pink , no lesions erythema or exudate. Lungs: Clear to auscultation bilaterally.  Heart: Regular rate and rhythm, no murmurs rubs or gallops.  Abdomen: Bowel sounds are normal, mild lower abdominal tenderness, nondistended, no hepatosplenomegaly or masses, no abdominal bruits or hernia , no rebound or guarding. Rectal: multiple hemorrhoids noted externally. No erythema or swelling. Just inside anal canal, palpable hemorrhoid noted. nontender exam. No stool in rectal vault. No rectal prolapse or hemorrhoid prolapse with bearing down.   Extremities: No lower extremity edema. No clubbing or deformities. Neuro: Alert and oriented x 4   Skin: Warm and dry, no jaundice.   Psych: Alert and cooperative, normal mood and affect.   Labs:  Lab Results  Component Value Date   CREATININE 0.96 (H) 04/17/2019   BUN 14 04/17/2019   NA 144 04/17/2019   K 3.3 (L) 04/17/2019   CL 105 04/17/2019   CO2 30 04/17/2019   Lab Results  Component Value Date   ALT 14 04/17/2019   AST 15 04/17/2019   GGT 24 01/09/2018   ALKPHOS 86 04/20/2018   BILITOT 0.6 04/17/2019   Lab Results  Component Value Date   WBC 9.3 04/17/2019   HGB 15.1 04/17/2019   HCT 46.4 (H) 04/17/2019   MCV 92.1 04/17/2019   PLT 291 04/17/2019    Imaging Studies: Ct Abdomen Pelvis W Contrast  Result Date: 04/19/2019 CLINICAL DATA:  Fatigue with abdominal pain, nausea and vomiting. Suspect diverticulitis. EXAM: CT ABDOMEN AND PELVIS WITH CONTRAST TECHNIQUE: Multidetector CT imaging of the abdomen and pelvis was performed using the standard protocol following bolus administration of intravenous contrast. CONTRAST:  172mL OMNIPAQUE IOHEXOL 300 MG/ML  SOLN COMPARISON:  03/28/2019 FINDINGS: Lower chest: Lung bases demonstrate linear atelectasis/scarring right base. Hepatobiliary: Previous  cholecystectomy. Couple small right liver hypodensities unchanged likely cysts. Stable post cholecystectomy prominence of the common bile duct. Pancreas: Normal. Spleen: Normal. Adrenals/Urinary Tract: Adrenal glands are normal. Kidneys are normal in size without hydronephrosis or nephrolithiasis. Subcentimeter left renal cortical hypodensity too small to characterize but likely a cyst. Ureters and bladder are normal. Stomach/Bowel: Stomach and small bowel are normal. Appendix is normal. Mild diverticulosis of the colon most prominent over the sigmoid colon. Resolution of the previously seen acute diverticulitis of the sigmoid colon in the left pelvis. No acute inflammatory process visualized. Vascular/Lymphatic: Mild calcified plaque over the abdominal aorta. No adenopathy. Reproductive: Previous hysterectomy. Other: No free fluid or focal inflammatory change. Musculoskeletal:  Curvature of the lumbar spine convex right with degenerative changes of the spine. IMPRESSION: 1. Colonic diverticulosis. Interval resolution of previously seen sigmoid diverticulitis. No evidence of acute diverticulitis. 2. Couple small liver cysts unchanged. Stable post cholecystectomy prominence of the common bile duct. 3. Subcentimeter left renal cortical hypodensity too small to characterize but likely a cyst. 4.  Aortic Atherosclerosis (ICD10-I70.0). Electronically Signed   By: Marin Olp M.D.   On: 04/19/2019 12:49

## 2019-05-07 NOTE — Assessment & Plan Note (Signed)
Diagnosed with lymphocytic colitis in 2017.  Resolved with course of Entocort at that time.  This year she has been having significant watery stools.  She has had some Entocort periodically but she likely has not had a long enough course to induce remission.  She has been checked for C. difficile and other GI pathogens which were negative.  Treated for diverticulitis in August, CT in September showed resolution.  She has had stool studies since last round of antibiotics.  At this time we will try Entocort 9 mg daily.  She will call in 2 weeks to give a progress report, we will make a decision regarding tapering schedule at that time.  We will have her come back in 8 weeks for follow-up.  She was told she could use dicyclomine for abdominal cramping intermittently.  Would advise no more than 1 or 2/day, if she gets constipation she will have to stop it.  I requested that she withhold using on a regular basis so that we can tell whether the Entocort is making a difference.

## 2019-05-09 ENCOUNTER — Ambulatory Visit: Payer: Medicare HMO | Admitting: Nurse Practitioner

## 2019-05-27 ENCOUNTER — Telehealth: Payer: Self-pay | Admitting: *Deleted

## 2019-05-27 NOTE — Telephone Encounter (Signed)
Pt is calling in to let LSL know how Budesonide is working for her.  Pt says she does better taking 3 tablets.  [phone]

## 2019-05-28 NOTE — Telephone Encounter (Signed)
OK. Let's continue 9mg  daily until her 07/2019 OV. We will start taper at that time.

## 2019-05-28 NOTE — Telephone Encounter (Signed)
Called pt and made her aware of LSL's recommendations.  Pt voiced understanding.

## 2019-06-14 DIAGNOSIS — R69 Illness, unspecified: Secondary | ICD-10-CM | POA: Diagnosis not present

## 2019-06-17 DIAGNOSIS — R1084 Generalized abdominal pain: Secondary | ICD-10-CM | POA: Diagnosis not present

## 2019-06-17 DIAGNOSIS — K5792 Diverticulitis of intestine, part unspecified, without perforation or abscess without bleeding: Secondary | ICD-10-CM | POA: Diagnosis not present

## 2019-06-17 DIAGNOSIS — R69 Illness, unspecified: Secondary | ICD-10-CM | POA: Diagnosis not present

## 2019-07-09 ENCOUNTER — Encounter: Payer: Self-pay | Admitting: Gastroenterology

## 2019-07-09 ENCOUNTER — Ambulatory Visit: Payer: Medicare HMO | Admitting: Gastroenterology

## 2019-07-09 ENCOUNTER — Other Ambulatory Visit: Payer: Self-pay

## 2019-07-09 VITALS — BP 140/79 | HR 85 | Temp 97.6°F | Ht 64.0 in | Wt 160.6 lb

## 2019-07-09 DIAGNOSIS — R1030 Lower abdominal pain, unspecified: Secondary | ICD-10-CM | POA: Diagnosis not present

## 2019-07-09 DIAGNOSIS — K529 Noninfective gastroenteritis and colitis, unspecified: Secondary | ICD-10-CM | POA: Diagnosis not present

## 2019-07-09 DIAGNOSIS — K52832 Lymphocytic colitis: Secondary | ICD-10-CM | POA: Diagnosis not present

## 2019-07-09 MED ORDER — CIPROFLOXACIN HCL 500 MG PO TABS: 500.0000 mg | ORAL_TABLET | Freq: Two times a day (BID) | ORAL | 0 refills | Status: AC

## 2019-07-09 MED ORDER — METRONIDAZOLE 500 MG PO TABS: 500.0000 mg | ORAL_TABLET | Freq: Three times a day (TID) | ORAL | 0 refills | Status: AC

## 2019-07-09 NOTE — Assessment & Plan Note (Signed)
Recurrent abdominal pain, woke her up from sleep early this morning. Initially diffuse but now more lower abdomen, believes her diverticulitis is the cause of her pain. Obtain stool studies, then start Cipro/Flagyl for 10 days. If symptoms worsen she will let me know but we will have her call in 48 hours with update on her condition. Low residue diet for now.

## 2019-07-09 NOTE — Progress Notes (Signed)
Primary Care Physician: Celene Squibb, MD  Primary Gastroenterologist:  Garfield Cornea, MD   Chief Complaint  Patient presents with  . Abdominal Pain    "all over but mostly lower abdomen" started around 1 am  . Bloated    when eats food  . Diarrhea    7-15 times per day    HPI: Jennifer [name] is a 73 y.o. female here for follow-up.  Patient was last seen May 07, 2019.  History of lymphocytic colitis.  Diagnosis was made in 2017.  Her last colonoscopy was March 06, 2019, 2 tubular adenomas removed, diverticulosis.  No random colon biopsies performed.  Plan for next colonoscopy in 5 years.  Stool testing in August showed [place] antigen detected, toxin A+B not detected, follow-up PCR testing was negative.  GI pathogen panel was negative.  In February [facility] antigen was negative.  CT abdomen pelvis with contrast April 19, 2019 showed interval resolution of previously seen sigmoid diverticulitis. CT in 03/2019 with mild apparent inflammation adjacent to the distal sigmoid colon, questionable mild acute diverticulitis.  Patient states this year she has not had complete resolution of her diarrhea.  She did show improvement on 9 mg of Entocort but continues to have frequent loose stools.  When she taper down to 6 mg daily she had worsening of her diarrhea.  We advised her to go back on 9 mg daily until today's visit.  Over the past couple of weeks BM 4-10 loose/runny stools over past two weeks.  Can have upwards to 15 stools per day.  Sometimes only small amount of stool but can have large volume stool. Generally no nocturnal diarrhea.  In the mornings can have up to 9-10 stools within a couple of hours.  No solid stools.  She has had some bright red blood per rectum once in the past 1 month, noted with wiping, suspected hemorrhoids.  Continues to take dicyclomine 3 daily.  Imodium never helped.  Last night 1am woke up with abdominal pain generalized, this morning mostly in lower abdomen. Worse  with coughing and movement. No vomiting. Some nausea. Abdominal swelling intermittent. No fever. Took hydrocodone last night for pain. Not one to take pain pills.  No dysuria.    No ill contacts.  Current Outpatient Medications  Medication Sig Dispense Refill  . albuterol (VENTOLIN HFA) 108 (90 Base) MCG/ACT inhaler Inhale 2 puffs into the lungs every 6 (six) hours as needed for wheezing or shortness of breath.    [name] ELLIPTA 62.5-25 MCG/INH AEPB Inhale 1 puff into the lungs daily.    . budesonide (ENTOCORT EC) 3 MG 24 hr capsule Take 3 capsules (9 mg total) by mouth daily. 90 capsule 1  . Calcium Carb-Cholecalciferol (CALCIUM 600 + D PO) Take 2 tablets by mouth daily.    Marland Kitchen Dextromethorphan-guaiFENesin (MUCINEX DM MAXIMUM STRENGTH) 60-1200 MG TB12 Take 1 tablet by mouth as needed.     . dicyclomine (BENTYL) 10 MG capsule Take 1 capsule (10 mg total) by mouth 4 (four) times daily as needed for spasms. 90 capsule 3  . fluticasone (FLONASE) 50 MCG/ACT nasal spray Place 2 sprays into both nostrils 2 (two) times daily as needed for allergies.   1  . HYDROcodone-acetaminophen (NORCO/VICODIN) 5-325 MG tablet Take 1 tablet by mouth every 6 (six) hours as needed for moderate pain.    . hydrOXYzine (ATARAX/VISTARIL) 25 MG tablet Take 25 mg by mouth every 8 (eight) hours.    Marland Kitchen  levothyroxine (SYNTHROID, LEVOTHROID) 100 MCG tablet Take 100 mcg by mouth daily before breakfast.    . Magnesium 400 MG CAPS Take 800 mg by mouth at bedtime.    . methocarbamol (ROBAXIN) 500 MG tablet Take 500 mg by mouth every 6 (six) hours as needed for muscle spasms.    . mirabegron ER (MYRBETRIQ) 25 MG TB24 tablet Take 25 mg by mouth at bedtime.    . pantoprazole (PROTONIX) 40 MG tablet TAKE ONE TABLET BY MOUTH ONCE DAILY BEFORE BREAKFAST. (Patient taking differently: Take 40 mg by mouth daily. ) 90 tablet 3  . simvastatin (ZOCOR) 40 MG tablet Take 40 mg by mouth every evening.    . triamcinolone cream (KENALOG) 0.1 % Apply  1 application topically 4 (four) times daily as needed for itching.    . hydrocortisone (ANUSOL-HC) 2.5 % rectal cream Place 1 application rectally 2 (two) times daily. (Patient not taking: Reported on 07/09/2019) 30 g 1   No current facility-administered medications for this visit.     Allergies as of 07/09/2019 - Review Complete 07/09/2019  Allergen Reaction Noted  . Desyrel [trazodone]  07/14/2014  . Keflex [cephalexin]  07/14/2014  . Klonopin [clonazepam]  07/14/2014  . Minocin [minocycline hcl]  07/14/2014  . Stelazine [trifluoperazine]  07/14/2014  . Viberzi [eluxadoline]  03/31/2015    ROS:  General: Negative for anorexia, weight loss, fever, chills, fatigue, weakness. ENT: Negative for hoarseness, difficulty swallowing , nasal congestion. CV: Negative for chest pain, angina, palpitations, dyspnea on exertion, peripheral edema.  Respiratory: Negative for dyspnea at rest, dyspnea on exertion, cough, sputum, wheezing.  GI: See history of present illness. GU:  Negative for dysuria, hematuria, urinary incontinence, urinary frequency, nocturnal urination.  Endo: Negative for unusual weight change.    Physical Examination:   BP 140/79   Pulse 85   Temp 97.6 F (36.4 C) (Oral)   Ht 5\' 4"  (1.626 m)   Wt 160 lb 9.6 oz (72.8 kg)   BMI 27.57 kg/m   General: Well-nourished, well-developed in no acute distress.  Eyes: No icterus. Mouth: Oropharyngeal mucosa moist and pink , no lesions erythema or exudate. Lungs: Clear to auscultation bilaterally.  Heart: Regular rate and rhythm, no murmurs rubs or gallops.  Abdomen: Bowel sounds are normal, mild diffuse abdominal tenderness, somewhat increased in the lower abdomen, nondistended, no hepatosplenomegaly or masses, no abdominal bruits or hernia , no rebound or guarding.   Extremities: No lower extremity edema. No clubbing or deformities. Neuro: Alert and oriented x 4   Skin: Warm and dry, no jaundice.   Psych: Alert and  cooperative, normal mood and affect.  Labs:  Lab Results  Component Value Date   CREATININE 0.96 (H) 04/17/2019   BUN 14 04/17/2019   NA 144 04/17/2019   K 3.3 (L) 04/17/2019   CL 105 04/17/2019   CO2 30 04/17/2019   Lab Results  Component Value Date   ALT 14 04/17/2019   AST 15 04/17/2019   GGT 24 01/09/2018   ALKPHOS 71 04/17/2019   BILITOT 0.6 04/17/2019   Lab Results  Component Value Date   WBC 9.3 04/17/2019   HGB 15.1 04/17/2019   HCT 46.4 (H) 04/17/2019   MCV 92.1 04/17/2019   PLT 291 04/17/2019   Lab Results  Component Value Date   LIPASE 19 04/17/2019      Imaging Studies: No results found.

## 2019-07-09 NOTE — Assessment & Plan Note (Signed)
Continues having refractory diarrhea on Entocort 9mg  daily. Noted worsening of symptoms with dose taper and is not back on 9mg  daily. Anywhere for 4-15 stools per day. In 09/2018, Cdiff GDH was negative. In 03/2019 it was positive but Toxins A and B as well as PCR were all negative. Concerned she may have underlying Cdiff and recommend rechecking. Will continue Entorcor 9mg  daily for now.

## 2019-07-09 NOTE — Patient Instructions (Signed)
1. Please collect stool test and return to lab today BEFORE starting your antibiotics. 2. Until you have completed antibiotics, please avoid raw vegetables, fruit/salad, etc. You can add back to your diet later.  3. Call Thursday and let me know how you are feeling.  4. Continue Entocort 3 capsules daily.

## 2019-07-10 DIAGNOSIS — E559 Vitamin D deficiency, unspecified: Secondary | ICD-10-CM | POA: Diagnosis not present

## 2019-07-10 DIAGNOSIS — M545 Low back pain: Secondary | ICD-10-CM | POA: Diagnosis not present

## 2019-07-10 DIAGNOSIS — L03314 Cellulitis of groin: Secondary | ICD-10-CM | POA: Diagnosis not present

## 2019-07-10 DIAGNOSIS — R1084 Generalized abdominal pain: Secondary | ICD-10-CM | POA: Diagnosis not present

## 2019-07-10 DIAGNOSIS — Z0001 Encounter for general adult medical examination with abnormal findings: Secondary | ICD-10-CM | POA: Diagnosis not present

## 2019-07-10 DIAGNOSIS — L299 Pruritus, unspecified: Secondary | ICD-10-CM | POA: Diagnosis not present

## 2019-07-10 DIAGNOSIS — J305 Allergic rhinitis due to food: Secondary | ICD-10-CM | POA: Diagnosis not present

## 2019-07-10 DIAGNOSIS — R69 Illness, unspecified: Secondary | ICD-10-CM | POA: Diagnosis not present

## 2019-07-10 DIAGNOSIS — K6289 Other specified diseases of anus and rectum: Secondary | ICD-10-CM | POA: Diagnosis not present

## 2019-07-10 DIAGNOSIS — J019 Acute sinusitis, unspecified: Secondary | ICD-10-CM | POA: Diagnosis not present

## 2019-07-10 NOTE — Progress Notes (Signed)
cc'ed to pcp °

## 2019-07-13 DIAGNOSIS — K122 Cellulitis and abscess of mouth: Secondary | ICD-10-CM | POA: Diagnosis not present

## 2019-07-15 DIAGNOSIS — R7301 Impaired fasting glucose: Secondary | ICD-10-CM | POA: Diagnosis not present

## 2019-07-15 DIAGNOSIS — E039 Hypothyroidism, unspecified: Secondary | ICD-10-CM | POA: Diagnosis not present

## 2019-07-15 DIAGNOSIS — K52832 Lymphocytic colitis: Secondary | ICD-10-CM | POA: Diagnosis not present

## 2019-07-15 DIAGNOSIS — R600 Localized edema: Secondary | ICD-10-CM | POA: Diagnosis not present

## 2019-07-15 DIAGNOSIS — R319 Hematuria, unspecified: Secondary | ICD-10-CM | POA: Diagnosis not present

## 2019-07-15 DIAGNOSIS — J449 Chronic obstructive pulmonary disease, unspecified: Secondary | ICD-10-CM | POA: Diagnosis not present

## 2019-07-15 DIAGNOSIS — E46 Unspecified protein-calorie malnutrition: Secondary | ICD-10-CM | POA: Diagnosis not present

## 2019-07-15 DIAGNOSIS — Z23 Encounter for immunization: Secondary | ICD-10-CM | POA: Diagnosis not present

## 2019-07-15 DIAGNOSIS — E782 Mixed hyperlipidemia: Secondary | ICD-10-CM | POA: Diagnosis not present

## 2019-07-15 DIAGNOSIS — R945 Abnormal results of liver function studies: Secondary | ICD-10-CM | POA: Diagnosis not present

## 2019-07-18 ENCOUNTER — Telehealth: Payer: Self-pay | Admitting: Internal Medicine

## 2019-07-18 DIAGNOSIS — R109 Unspecified abdominal pain: Secondary | ICD-10-CM

## 2019-07-18 MED ORDER — ONDANSETRON HCL 4 MG PO TABS: 4.0000 mg | ORAL_TABLET | ORAL | 0 refills | Status: DC | PRN

## 2019-07-18 NOTE — Telephone Encounter (Signed)
Spoke with pt. Pt originally said her pain level was a 10 and is located below her navel moving down above her pubic area. Pt corrected her statement and says she is currently laying down and isn't having any pain. Pain isn't constant, pain comes and goes. Pain is present when she is standing up/moving around and sitting down. Pt pain level gets to a 10 when the pain is present. Pt says the gas pain hurts as well.  Pt is aware that if her pain level is at a 10 or unbearable, she should be evaluated at the ED.  Pt would like an RX for Zofran.

## 2019-07-18 NOTE — Telephone Encounter (Signed)
[phone] please patient, she was supposed to turn in a stool sample and stated she has not had enough bowel movement to complete the stool test.  She was supposed to start antibiotics after she took the sample.  She was very upset on the phone stating she is in pain and does not know what she can do  Please call

## 2019-07-18 NOTE — Addendum Note (Signed)
Addended by: Mahala Menghini on: 07/18/2019 04:20 PM   Modules accepted: Orders

## 2019-07-18 NOTE — Telephone Encounter (Signed)
Noted. Spoke with pt. Pt is aware that RX was sent into her pharmacy. Follow up in the morning.

## 2019-07-18 NOTE — Telephone Encounter (Signed)
Spoke with pt. She went to her dentist on 07/13/2019 and was placed on Antibiotics for what was thought to be her tooth, but turned out to be a sinus infection. Pt is taking Amoxicillin 875 mg one tab po daily for 7-10days. Pt wants to submit her stool sample but isn't able to have bowel movements the way she was previously was. Pt is taking her antibiotics with a sip of water and is able to hold the medicine down without vomiting. If pt drinks fluids without taking her medicine, she isn't able to keep the water down. Pt isn't eating, gets nauseated. The smell of food makes pt sick. Pt was able to eat two eggs and it didn't come back up. Pt ate some crackers to take her medicine and the crackers didn't come back up, pt states the water did come back up. Pt is passing gas and no stool will come out. Pt has pain pills at home but states she can't take the pain meds to help with her abdominal pain due to the pills making her feel weird.

## 2019-07-18 NOTE — Telephone Encounter (Signed)
RX for Zofran done.   If her pain continues as well as nausea, we will have to pursue CT A/P with contrast.   Let's reach out to her in the morning and see how she did over night.

## 2019-07-18 NOTE — Telephone Encounter (Signed)
On 07/09/19 send in the office and given RX for Cipro/Flagyl for possible diverticulitis (new onset abdominal pain the night before) but was concerned about possibility of Cdiff so advised stool studies and then start antibiotics.   She was supposed to call in 48 hours if not better.  Calls in 9 days later with noted symptoms. No longer have diarrhea as before. N/V are new symptoms. Amoxicillin will not treat diverticulitis.   1# Please clarify where her abdominal pain is. Does it come in go? Rate on scale 1-10, 10 being the worse.  2# If she is having significant abdominal pain, she may have to go to ER. 3# We can call in nausea medication if needed. Zofran 4mg  every 4 hours as needed. #20, no refills.  4# I really don't want to have her start Cipro and Flagyl if she is taking Amoxicillin right now. So have her hold off on stools (since diarrhea resolved) and hold off on cipro/flagyl.

## 2019-07-22 ENCOUNTER — Ambulatory Visit (HOSPITAL_COMMUNITY)
Admission: RE | Admit: 2019-07-22 | Discharge: 2019-07-22 | Disposition: A | Discharge status: Home / Self Care | Payer: Medicare HMO | Source: Ambulatory Visit | Attending: Gastroenterology | Admitting: Gastroenterology

## 2019-07-22 ENCOUNTER — Other Ambulatory Visit: Payer: Self-pay

## 2019-07-22 DIAGNOSIS — R109 Unspecified abdominal pain: Secondary | ICD-10-CM | POA: Insufficient documentation

## 2019-07-22 LAB — POCT I-STAT CREATININE: Creatinine, Ser: 0.7 mg/dL (ref 0.44–1.00)

## 2019-07-22 MED ORDER — IOHEXOL 300 MG/ML  SOLN
100.0000 mL | Freq: Once | INTRAMUSCULAR | Status: AC | PRN
  Administered 2019-07-22: 100 mL via INTRAVENOUS

## 2019-07-22 NOTE — Addendum Note (Signed)
Addended by: Cheron Every on: 07/22/2019 09:59 AM   Modules accepted: Orders

## 2019-07-22 NOTE — Telephone Encounter (Signed)
Given persistent pain I would offer her CT A/P with contrast in the next 24 hours.   Will need creatinine.   Patient needs ov with RMR only.

## 2019-07-22 NOTE — Telephone Encounter (Signed)
Patient called back and states pain is about 6/10. No nausea/no vomiting

## 2019-07-22 NOTE — Telephone Encounter (Signed)
Called pt. Agreeable to CT. PA done via [employer] and was approved. Auth# [id number] dates 07/22/2019-01/18/2020  Called CS and patient can go ahead and head over now for CT  Called pt and is aware. She will head over to CT now

## 2019-07-22 NOTE — Telephone Encounter (Signed)
LMOVM

## 2019-08-01 DIAGNOSIS — R69 Illness, unspecified: Secondary | ICD-10-CM | POA: Diagnosis not present

## 2019-08-01 DIAGNOSIS — K219 Gastro-esophageal reflux disease without esophagitis: Secondary | ICD-10-CM | POA: Diagnosis not present

## 2019-08-30 ENCOUNTER — Encounter: Payer: Self-pay | Admitting: Internal Medicine

## 2019-08-30 NOTE — Progress Notes (Signed)
PATIENT SCHEDULED AND LETTER SENT  °

## 2019-09-02 ENCOUNTER — Telehealth: Payer: Self-pay | Admitting: Internal Medicine

## 2019-09-02 NOTE — Telephone Encounter (Signed)
Pt said she was returning a call [phone]

## 2019-09-02 NOTE — Telephone Encounter (Signed)
Called pt back. No VM is available. Will call pt back.

## 2019-09-08 NOTE — Progress Notes (Signed)
Referring Provider: Celene Squibb, MD Primary Care Physician:  Celene Squibb, MD Primary GI Physician: Dr. Gala Romney  Chief Complaint  Patient presents with  . Follow-up    Fu diverticulitis,needs refill budesonide    HPI:   Jennifer [name] is a 75 y.o. female with history of GERD and lymphocytic colitis diagnosed in 2017.  Last colonoscopy on 03/06/2019 with 2 tubular adenomas and diverticulosis.  Recommendations to repeat in 5 years.  Presenting today for follow-up of diarrhea and abdominal pain.  She was last seen in our office on 07/09/2019.  Noted she had not had complete resolution of diarrhea.  Improved on 9 mg of Entocort (started in October 2020) but continued with frequent loose stools.  When tapering down to 6 mg of Entocort, she had worsening diarrhea, so Entocort was resumed at 9 mg daily.  Over the last couple weeks, 4-15 loose/runny stools a day.  Was noted in February 2020 C. difficile GDH was negative but in August 2020 [place] was positive but toxins AMB as well as PCR were all negative.  She also reported acute onset of recurrent lower abdominal pain that woke her up in the middle of the night before her appointment.  Due to significant diarrhea, there was concern for underlying C. difficile.  Recommended completing stool studies and continuing Entocort 9 mg daily.  Also suspected recurrent diverticulitis but prior to starting antibiotics, she would need to complete stool studies.  Telephone call on 07/18/2019 regarding abdominal pain and bowel habits.  Diarrhea had resolved.  Was not able to complete stool studies due to no diarrhea.  Continued with lower abdominal pain, 6/10 in severity.  CT abdomen and pelvis on 07/22/2019 revealed acute uncomplicated diverticulitis in the mid sigmoid colon.  She was prescribed Cipro and Flagyl x 10 days.   Today: Completed antibiotics and abdominal pain has resolved. Finished Entocort prescription. Only had 2 capsules yesterday and diarrhea started  again. When taking 9 mg daily, would only have 1-2 BMs daily. Either soft and formed or mushy, not watery. Had 6 BMs yesterday that were loose. Associated urgency. Has tried bentyl with 9 mg Entocort and not sure if helped because Entocort worked so well. Hasn't tried Bentyl with 6 mg entocort. Stools are always after eating/drinking. May also have stools other times. Tried imodium without help years ago. Hasn't tried tums with meals. Milk worsens diarrhea. Has 1/4 cup daily with oat meal. Not much cheese.  No blood in the stool. No black stools.  No heartburn, reflux, indigestion, dysphagia, nausea, or vomiting.     No NSAIDs.   No regular coffee. She does drink tea once a day. Doesn't eat much fried/fatty foods. Has tried to limit ground beef. Taking magnesium for her hair.    Past Medical History:  Diagnosis Date  . Depression   . GERD (gastroesophageal reflux disease)   . History of colon polyps    remote past, unable to retrieve records   . Hyperlipidemia   . Hypothyroidism   . Rheumatoid arthritis [facility])     Past Surgical History:  Procedure Laterality Date  . COLONOSCOPY     last one was about 3 yrs in [place]  . COLONOSCOPY N/A 02/19/2016   Dr. Gala Romney: three 3-6 mm polyps (tubular adenomas) in ascending colon, segmental biopsies consistent with lymphocytic colitis. Prescribed entocort.   . COLONOSCOPY N/A 03/06/2019   Dr. Gala Romney: 2 polyps removed, tubular adenomas, diverticulosis.  NO random colon biopsies performed.  Next  colonoscopy 5 years.  . ESOPHAGOGASTRODUODENOSCOPY N/A 08/11/2014   RMR: MIld erosive  reflux esophagitis. small hiatal hernia. Abnormal gastric muocsa uncertain significance. Subtly abnormal duodeanl (bulbar) mucosa- status post multiple biopsies. BENIGN small bowel mucosa, no evidence of villous blulnting, mild reactive gastropathy on stomach biopsy   . GALLBLADDER SURGERY    . LEG SURGERY     right  . PARTIAL HYSTERECTOMY      Current Outpatient  Medications  Medication Sig Dispense Refill  . albuterol (VENTOLIN HFA) 108 (90 Base) MCG/ACT inhaler Inhale 2 puffs into the lungs every 6 (six) hours as needed for wheezing or shortness of breath.    [name] ELLIPTA 62.5-25 MCG/INH AEPB Inhale 1 puff into the lungs daily.    . budesonide (ENTOCORT EC) 3 MG 24 hr capsule Take 2 capsules (6 mg total) by mouth daily. 90 capsule 0  . Calcium Carb-Cholecalciferol (CALCIUM 600 + D PO) Take 2 tablets by mouth daily.    Marland Kitchen Dextromethorphan-guaiFENesin (MUCINEX DM MAXIMUM STRENGTH) 60-1200 MG TB12 Take 1 tablet by mouth as needed.     . dicyclomine (BENTYL) 10 MG capsule Take 1 capsule (10 mg total) by mouth 4 (four) times daily as needed for spasms. 90 capsule 3  . fluticasone (FLONASE) 50 MCG/ACT nasal spray Place 2 sprays into both nostrils 2 (two) times daily as needed for allergies.   1  . hydrOXYzine (ATARAX/VISTARIL) 25 MG tablet Take 25 mg by mouth as needed.     Marland Kitchen levothyroxine (SYNTHROID, LEVOTHROID) 100 MCG tablet Take 100 mcg by mouth daily before breakfast.    . Magnesium 400 MG CAPS Take 800 mg by mouth at bedtime.    . methocarbamol (ROBAXIN) 500 MG tablet Take 500 mg by mouth as needed for muscle spasms.     . mirabegron ER (MYRBETRIQ) 25 MG TB24 tablet Take 25 mg by mouth at bedtime.    . pantoprazole (PROTONIX) 40 MG tablet TAKE ONE TABLET BY MOUTH ONCE DAILY BEFORE BREAKFAST. (Patient taking differently: Take 40 mg by mouth daily. ) 90 tablet 3  . simvastatin (ZOCOR) 40 MG tablet Take 40 mg by mouth every evening.     No current facility-administered medications for this visit.    Allergies as of 09/09/2019 - Review Complete 09/09/2019  Allergen Reaction Noted  . Desyrel [trazodone]  07/14/2014  . Keflex [cephalexin]  07/14/2014  . Klonopin [clonazepam]  07/14/2014  . Minocin [minocycline hcl]  07/14/2014  . Stelazine [trifluoperazine]  07/14/2014  . Viberzi [eluxadoline]  03/31/2015    Family History  Problem Relation Age  of Onset  . Colon cancer Neg Hx     Social History   Socioeconomic History  . Marital status: Married    Spouse name: Not on file  . Number of children: 2  . Years of education: Not on file  . Highest education level: Not on file  Occupational History  . Occupation: disability  Tobacco Use  . Smoking status: Current Every Day Smoker    Packs/day: 0.25    Types: Cigarettes  . Smokeless tobacco: Never Used  . Tobacco comment: quit 03/31/18  Substance and Sexual Activity  . Alcohol use: No    Alcohol/week: 0.0 standard drinks  . Drug use: No  . Sexual activity: Not on file  Other Topics Concern  . Not on file  Social History Narrative  . Not on file   Social Determinants of Health   Financial Resource Strain:   . Difficulty of Paying Living  Expenses: Not on file  Food Insecurity:   . Worried About Charity fundraiser in the Last Year: Not on file  . Ran Out of Food in the Last Year: Not on file  Transportation Needs:   . Lack of Transportation (Medical): Not on file  . Lack of Transportation (Non-Medical): Not on file  Physical Activity:   . Days of Exercise per Week: Not on file  . Minutes of Exercise per Session: Not on file  Stress:   . Feeling of Stress : Not on file  Social Connections:   . Frequency of Communication with Friends and Family: Not on file  . Frequency of Social Gatherings with Friends and Family: Not on file  . Attends Religious Services: Not on file  . Active Member of Clubs or Organizations: Not on file  . Attends Archivist Meetings: Not on file  . Marital Status: Not on file    Review of Systems: Gen: Denies fever, chills, lightheadedness, dizziness, presyncope, or syncope. CV: Denies chest pain or palpitations Resp: Some shortness of breath with exertion. Occasional cough.  GI: See HPI Derm: Denies rash Psych: Denies depression or anxiety Heme: See HPI  Physical Exam: BP (!) 150/81   Pulse 68   Temp (!) 97.3 F (36.3 C)  (Temporal)   Ht 5\' 4"  (1.626 m)   Wt 149 lb 6.4 oz (67.8 kg)   BMI 25.64 kg/m  General:   Alert and oriented. No distress noted. Pleasant and cooperative.  Head:  Normocephalic and atraumatic. Eyes:  Conjuctiva clear without scleral icterus. Heart:  S1, S2 present without murmurs appreciated. Lungs:  Clear to auscultation bilaterally. No wheezes, rales, or rhonchi. No distress.  Abdomen:  +BS, soft, non-tender and non-distended. No rebound or guarding. No HSM or masses noted. Msk:  Symmetrical without gross deformities. Normal posture. Extremities:  Without edema. Neurologic:  Alert and  oriented x4 Psych: Normal mood and affect.

## 2019-09-09 ENCOUNTER — Encounter: Payer: Self-pay | Admitting: Gastroenterology

## 2019-09-09 ENCOUNTER — Other Ambulatory Visit: Payer: Self-pay

## 2019-09-09 ENCOUNTER — Ambulatory Visit: Payer: Medicare HMO | Admitting: Gastroenterology

## 2019-09-09 VITALS — BP 150/81 | HR 68 | Temp 97.3°F | Ht 64.0 in | Wt 149.4 lb

## 2019-09-09 DIAGNOSIS — Z8719 Personal history of other diseases of the digestive system: Secondary | ICD-10-CM | POA: Diagnosis not present

## 2019-09-09 DIAGNOSIS — K52832 Lymphocytic colitis: Secondary | ICD-10-CM

## 2019-09-09 MED ORDER — BUDESONIDE 3 MG PO CPEP: 6.0000 mg | ORAL_CAPSULE | Freq: Every day | ORAL | 0 refills | Status: DC

## 2019-09-09 NOTE — Patient Instructions (Addendum)
Continue taking Entocort 6 mg (2 capsules) daily.  Resume taking Bentyl 10 mg. You may take this up to 3 times daily before meals. Try starting with 1-2 times daily. Hold in the setting of constipation.  Possible side effects include dizziness, dry mouth, dry eyes, or urinary trouble.   Start taking 1 Tums with breakfast, lunch, and dinner.  Please follow a dairy/lactose free diet or take Lactaid pills prior to consuming any dairy products.  Try stopping magnesium for now to see if this helps her diarrhea.  We will plan to see you back in 4 weeks.  If you have questions or concerns prior, do not hesitate to call.  Aliene Altes, PA-C [facility]   Lactose-Free Diet, Adult If you have lactose intolerance, you are not able to digest lactose. Lactose is a natural sugar found mainly in dairy milk and dairy products. You may need to avoid all foods and beverages that contain lactose. A lactose-free diet can help you do this. Which foods have lactose? Lactose is found in dairy milk and dairy products, such as:  Yogurt.  Cheese.  Butter.  Margarine.  Sour cream.  Cream.  Whipped toppings and nondairy creamers.  Ice cream and other dairy-based desserts. Lactose is also found in foods or products made with dairy milk or milk ingredients. To find out whether a food contains dairy milk or a milk ingredient, look at the ingredients list. Avoid foods with the statement "May contain milk" and foods that contain:  Milk powder.  Whey.  Curd.  Caseinate.  Lactose.  Lactalbumin.  Lactoglobulin. What are alternatives to dairy milk and foods made with milk products?  Lactose-free milk.  Soy milk with added calcium and vitamin D.  Almond milk, coconut milk, rice milk, or other nondairy milk alternatives with added calcium and vitamin D. Note that these are low in protein.  Soy products, such as soy yogurt, soy cheese, soy ice cream, and soy-based sour  cream.  Other nut milk products, such as almond yogurt, almond cheese, cashew yogurt, cashew cheese, cashew ice cream, coconut yogurt, and coconut ice cream. What are tips for following this plan?  Do not consume foods, beverages, vitamins, minerals, or medicines containing lactose. Read ingredient lists carefully.  Look for the words "lactose-free" on labels.  Use lactase enzyme drops or tablets as directed by your health care provider.  Use lactose-free milk or a milk alternative, such as soy milk or almond milk, for drinking and cooking.  Make sure you get enough calcium and vitamin D in your diet. A lactose-free eating plan can be lacking in these important nutrients.  Take calcium and vitamin D supplements as directed by your health care provider. Talk to your health care provider about supplements if you are not able to get enough calcium and vitamin D from food. What foods can I eat?  Fruits All fresh, canned, frozen, or dried fruits that are not processed with lactose. Vegetables All fresh, frozen, and canned vegetables without cheese, cream, or butter sauces. Grains Any that are not made with dairy milk or dairy products. Meats and other proteins Any meat, fish, poultry, and other protein sources that are not made with dairy milk or dairy products. Soy cheese and yogurt. Fats and oils Any that are not made with dairy milk or dairy products. Beverages Lactose-free milk. Soy, rice, or almond milk with added calcium and vitamin D. Fruit and vegetable juices. Sweets and desserts Any that are not made with dairy milk  or dairy products. Seasonings and condiments Any that are not made with dairy milk or dairy products. Calcium Calcium is found in many foods that contain lactose and is important for bone health. The amount of calcium you need depends on your age:  Adults younger than 50 years: 1,000 mg of calcium a day.  Adults older than 50 years: 1,200 mg of calcium a  day. If you are not getting enough calcium, you may get it from other sources, including:  Orange juice with calcium added. There are 300-350 mg of calcium in 1 cup of orange juice.  Calcium-fortified soy milk. There are 300-400 mg of calcium in 1 cup of calcium-fortified soy milk.  Calcium-fortified rice or almond milk. There are 300 mg of calcium in 1 cup of calcium-fortified rice or almond milk.  Calcium-fortified breakfast cereals. There are 100-1,000 mg of calcium in calcium-fortified breakfast cereals.  Spinach, cooked. There are 145 mg of calcium in  cup of cooked spinach.  Edamame, cooked. There are 130 mg of calcium in  cup of cooked edamame.  Collard greens, cooked. There are 125 mg of calcium in  cup of cooked collard greens.  Kale, frozen or cooked. There are 90 mg of calcium in  cup of cooked or frozen kale.  Almonds. There are 95 mg of calcium in  cup of almonds.  Broccoli, cooked. There are 60 mg of calcium in 1 cup of cooked broccoli. The items listed above may not be a complete list of recommended foods and beverages. Contact a dietitian for more options. What foods are not recommended? Fruits None, unless they are made with dairy milk or dairy products. Vegetables None, unless they are made with dairy milk or dairy products. Grains Any grains that are made with dairy milk or dairy products. Meats and other proteins None, unless they are made with dairy milk or dairy products. Dairy All dairy products, including milk, goat's milk, buttermilk, kefir, acidophilus milk, flavored milk, evaporated milk, condensed milk, dulce de Anderson, eggnog, yogurt, cheese, and cheese spreads. Fats and oils Any that are made with milk or milk products. Margarines and salad dressings that contain milk or cheese. Cream. Half and half. Cream cheese. Sour cream. Chip dips made with sour cream or yogurt. Beverages Hot chocolate. Cocoa with lactose. Instant iced teas. Powdered fruit  drinks. Smoothies made with dairy milk or yogurt. Sweets and desserts Any that are made with milk or milk products. Seasonings and condiments Chewing gum that has lactose. Spice blends if they contain lactose. Artificial sweeteners that contain lactose. Nondairy creamers. The items listed above may not be a complete list of foods and beverages to avoid. Contact a dietitian for more information. Summary  If you are lactose intolerant, it means that you have a hard time digesting lactose, a natural sugar found in milk and milk products.  Following a lactose-free diet can help you manage this condition.  Calcium is important for bone health and is found in many foods that contain lactose. Talk with your health care provider about other sources of calcium. This information is not intended to replace advice given to you by your health care provider. Make sure you discuss any questions you have with your health care provider. Document Revised: 08/15/2017 Document Reviewed: 08/15/2017 Elsevier Patient Education  2020 [employer].

## 2019-09-09 NOTE — Assessment & Plan Note (Addendum)
Acute uncomplicated diverticulitis in the mid sigmoid colon diagnosed on 07/22/2019 via CT.  She completed 10 days of Cipro and Flagyl and has had resolution of all abdominal pain. Currently with chronic diarrhea in the setting of lymphocytic colitis as addressed above.   Continue to monitor symptoms. Lymphocytic colitis/diarrhea addressed above.

## 2019-09-09 NOTE — Assessment & Plan Note (Addendum)
74 year old female with known history of lymphocytic colitis diagnosed in 2017.  Intermittently, she has been on Entocort for flares of diarrhea.  Most recently, started on Entocort 9 mg in October 2020 and has been unable to taper.  She ran out of Entocort today and only had 2 capsules yesterday.  With this, she had return of diarrhea.  Entocort 9 mg keeps symptoms very well controlled with 1-2 soft, formed or mushy BMs daily without watery stools.  Yesterday, on 6 mg of Entocort, she had 6 loose stools.  Always with postprandial BMs in the setting of diarrhea.  Denies abdominal pain, bright red blood per rectum, melena. No NSAIDs. Colonoscopy up-to-date in August 2020 and due for repeat in 2025.  Lymphocytic colitis is definitely one cause of her diarrhea; however, there are other potential contributing factors.  She notes milk worsens diarrhea but has 1/4 cup of milk daily.  She is status post cholecystectomy and could have bile salt diarrhea.  She is also started magnesium for her hair.  She was treated for diverticulitis in December and therefore has risk factor for C. difficile; however, as symptoms started immediately after decreasing Entocort, I am less suspicious for infectious diarrhea.   We will try continuing on Entocort 6 mg daily and make other medication/diet adjustments to see if we can improve diarrhea and work towards tapering Entocort.   Continue Entocort 6 mg daily.  Prescription refilled. Start with Bentyl 10 mg.  She is advised to start with 1-2 times daily before meals.  Hold in the setting of constipation.  Discussed possible side effects. Add 1 Tums with meals 3 times daily.  Consider cholestyramine but this would be difficult medication to dose for her. Follow a lactose-free diet or take Lactaid pills prior to consuming any dairy products. Hold magnesium for now. Follow-up in 4 weeks to see how she is doing and determine if Entocort can be tapered further.

## 2019-09-10 DIAGNOSIS — R69 Illness, unspecified: Secondary | ICD-10-CM | POA: Diagnosis not present

## 2019-09-10 DIAGNOSIS — K219 Gastro-esophageal reflux disease without esophagitis: Secondary | ICD-10-CM | POA: Diagnosis not present

## 2019-09-10 NOTE — Progress Notes (Signed)
Cc'ed to pcp °

## 2019-09-24 ENCOUNTER — Ambulatory Visit: Payer: Medicare HMO | Admitting: Gastroenterology

## 2019-09-30 DIAGNOSIS — Z720 Tobacco use: Secondary | ICD-10-CM | POA: Diagnosis not present

## 2019-09-30 DIAGNOSIS — I10 Essential (primary) hypertension: Secondary | ICD-10-CM | POA: Diagnosis not present

## 2019-09-30 DIAGNOSIS — R69 Illness, unspecified: Secondary | ICD-10-CM | POA: Diagnosis not present

## 2019-10-09 NOTE — Progress Notes (Signed)
Referring Provider: Celene Squibb, MD Primary Care Physician:  Celene Squibb, MD Primary GI Physician: Dr. Gala Romney  Chief Complaint  Patient presents with   Diarrhea    occurs if she doesn't take Entocort in time after eating   Abdominal Pain    better    HPI:   Jennifer [name] is a 74 y.o. female presenting today for follow-up of chronic diarrhea. History of GERD and lymphocytic colitis diagnosed in 2017. Last colonoscopy on 03/06/2019 with 2 tubular adenomas and diverticulosis.  Recommendations to repeat in 5 years. Also with acute uncomplicated diverticulitis in mid sigmoid colon on 07/22/19 s/p treatment with cipro and flagyl x 10 days.   Last seen in our office on 09/09/2019 for follow-up of diarrhea and abdominal pain/diverticulitis.  She had completed antibiotics and abdominal pain had resolved.  For chronic diarrhea, she has intermittently been on Entocort for flares.  Had been on 9 mg Entocort since October 2020 with inability to taper.  Her Entocort prescription had run out.  Only had 6 mg the day prior to her office visit and diarrhea had returned.  Had 6 BMs the day prior.  On 9 mg of Entocort, she only has 1-2 soft/formed or mushy BMs daily.  Had taken Bentyl with 9 mg Entocort in the past but not sure if Bentyl was effective as Entocort worked so well.  Not currently on Bentyl.  Imodium in the past was not helpful.  Had not tried Tums with meals.  Noted milk to worsen diarrhea but was drinking 1/4 cup daily.  Differentials for ongoing diarrhea with inability to taper Entocort included lymphocytic colitis, lactose intolerance, bile salt diarrhea, med effect due to magnesium.  Risk factors for C. difficile with recent antibiotic treatment for diverticulitis but less suspicious for this as symptoms started immediately after decreasing Entocort.  Plans to continue Entocort 6 mg daily, start Bentyl 10 mg 1-2 times daily before meals and hold in the setting of constipation, 1 Tums with meals 3  times daily, follow lactose-free diet or take Lactaid pills prior to any dairy consumption, hold magnesium, follow-up in 4 weeks.  No need to repeat colonoscopy as colonoscopy was within 1 year diverticulitis diagnosis.  Today: Had return of abdominal pain about 1 week ago after eating grapes. Mild. Lasted 1 day. This has resolved. No associated fever, nausea or vomiting. No blood in the stool.    Taking Entocort 6 mg daily, bentyl once a day, 1 Tums before meals. Stopped magnesium. Lactaid free milk. Otherwise, following dairy free diet. Overall, diarrhea is improved. Having 1 BM daily typically. Usually formed. If eating oatmeal and air fried chicken, she does well. Does well with mashed potatoes and butter beans and other side options. Other foods go straight through her. For example, hamburger and pizza. Salad did ok. If she eats something before taking Entocort, she will have diarrhea. No unintentional weight loss.   Reports having a lot of drainage in the back of her throat. Taking Zyrtec. This isn't helping much. Netti pot works well. Notes when sinuses flare up her diarrhea flares up.   GERD is well controlled on Protonix. No dysphagia.   No NSAIDs.   Past Medical History:  Diagnosis Date   Depression    GERD (gastroesophageal reflux disease)    History of colon polyps    remote past, unable to retrieve records    Hyperlipidemia    Hypothyroidism    Rheumatoid arthritis ([place])  Past Surgical History:  Procedure Laterality Date   COLONOSCOPY     last one was about 3 yrs in [facility]   COLONOSCOPY N/A 02/19/2016   Dr. Gala Romney: three 3-6 mm polyps (tubular adenomas) in ascending colon, segmental biopsies consistent with lymphocytic colitis. Prescribed entocort.    COLONOSCOPY N/A 03/06/2019   Dr. Gala Romney: 2 polyps removed, tubular adenomas, diverticulosis.  NO random colon biopsies performed.  Next colonoscopy 5 years.   ESOPHAGOGASTRODUODENOSCOPY N/A 08/11/2014   RMR:  MIld erosive  reflux esophagitis. small hiatal hernia. Abnormal gastric muocsa uncertain significance. Subtly abnormal duodeanl (bulbar) mucosa- status post multiple biopsies. BENIGN small bowel mucosa, no evidence of villous blulnting, mild reactive gastropathy on stomach biopsy    GALLBLADDER SURGERY     LEG SURGERY     right   PARTIAL HYSTERECTOMY      Current Outpatient Medications  Medication Sig Dispense Refill   albuterol (VENTOLIN HFA) 108 (90 Base) MCG/ACT inhaler Inhale 2 puffs into the lungs every 6 (six) hours as needed for wheezing or shortness of breath.     ANORO ELLIPTA 62.5-25 MCG/INH AEPB Inhale 1 puff into the lungs daily.     budesonide (ENTOCORT EC) 3 MG 24 hr capsule Take 2 capsules (6 mg total) by mouth daily. 90 capsule 0   Calcium Carb-Cholecalciferol (CALCIUM 600 + D PO) Take 2 tablets by mouth daily.     Dextromethorphan-guaiFENesin (MUCINEX DM MAXIMUM STRENGTH) 60-1200 MG TB12 Take 1 tablet by mouth as needed.      dicyclomine (BENTYL) 10 MG capsule Take 1 capsule (10 mg total) by mouth 4 (four) times daily as needed for spasms. 90 capsule 3   fluticasone (FLONASE) 50 MCG/ACT nasal spray Place 2 sprays into both nostrils 2 (two) times daily as needed for allergies.   1   hydrOXYzine (ATARAX/VISTARIL) 25 MG tablet Take 25 mg by mouth as needed.      levothyroxine (SYNTHROID, LEVOTHROID) 100 MCG tablet Take 100 mcg by mouth daily before breakfast.     methocarbamol (ROBAXIN) 500 MG tablet Take 500 mg by mouth as needed for muscle spasms.      mirabegron ER (MYRBETRIQ) 25 MG TB24 tablet Take 25 mg by mouth at bedtime.     pantoprazole (PROTONIX) 40 MG tablet TAKE ONE TABLET BY MOUTH ONCE DAILY BEFORE BREAKFAST. (Patient taking differently: Take 40 mg by mouth daily. ) 90 tablet 3   simvastatin (ZOCOR) 40 MG tablet Take 40 mg by mouth every evening.     cholestyramine (QUESTRAN) 4 g packet Take 1 packet (4 g total) by mouth daily with breakfast. 30 each  5   Magnesium 400 MG CAPS Take 800 mg by mouth at bedtime.     No current facility-administered medications for this visit.    Allergies as of 10/10/2019 - Review Complete 10/10/2019  Allergen Reaction Noted   Desyrel [trazodone]  07/14/2014   Keflex [cephalexin]  07/14/2014   Klonopin [clonazepam]  07/14/2014   Minocin [minocycline hcl]  07/14/2014   Stelazine [trifluoperazine]  07/14/2014   Viberzi [eluxadoline]  03/31/2015    Family History  Problem Relation Age of Onset   Colon cancer Neg Hx     Social History   Socioeconomic History   Marital status: Married    Spouse name: Not on file   Number of children: 2   Years of education: Not on file   Highest education level: Not on file  Occupational History   Occupation: disability  Tobacco Use  Smoking status: Current Every Day Smoker    Packs/day: 0.25    Types: Cigarettes   Smokeless tobacco: Never Used   Tobacco comment: quit 03/31/18  Substance and Sexual Activity   Alcohol use: No    Alcohol/week: 0.0 standard drinks   Drug use: No   Sexual activity: Not on file  Other Topics Concern   Not on file  Social History Narrative   Not on file   Social Determinants of Health   Financial Resource Strain:    Difficulty of Paying Living Expenses:   Food Insecurity:    Worried About Charity fundraiser in the Last Year:    Arboriculturist in the Last Year:   Transportation Needs:    Film/video editor (Medical):    Lack of Transportation (Non-Medical):   Physical Activity:    Days of Exercise per Week:    Minutes of Exercise per Session:   Stress:    Feeling of Stress :   Social Connections:    Frequency of Communication with Friends and Family:    Frequency of Social Gatherings with Friends and Family:    Attends Religious Services:    Active Member of Clubs or Organizations:    Attends Archivist Meetings:    Marital Status:     Review of  Systems: Gen: Denies fever, chills, lightheadedness, dizziness, presyncope, syncope. CV: Denies chest pain or heart palpitations. Resp: Denies dyspnea.  Admits to chronic intermittent cough. GI: See HPI Derm: Denies rash Heme: Denies bruising or bleeding  Physical Exam: BP (!) 144/83    Pulse 79    Temp (!) 97.1 F (36.2 C) (Temporal)    Ht 5\' 4"  (1.626 m)    Wt 153 lb 3.2 oz (69.5 kg)    BMI 26.30 kg/m  General:   Alert and oriented. No distress noted. Pleasant and cooperative.  Head:  Normocephalic and atraumatic. Eyes:  Conjuctiva clear without scleral icterus. Heart:  S1, S2 present without murmurs appreciated. Lungs:  Clear to auscultation bilaterally. No wheezes, rales, or rhonchi. No distress.  Abdomen:  +BS, soft, non-tender and non-distended. No rebound or guarding. No HSM or masses noted. Msk:  Symmetrical without gross deformities. Normal posture. Extremities: Trace bilateral lower extremity pitting edema. Neurologic:  Alert and  oriented x4 Psych:  Normal mood and affect.

## 2019-10-10 ENCOUNTER — Other Ambulatory Visit: Payer: Self-pay

## 2019-10-10 ENCOUNTER — Ambulatory Visit: Payer: Medicare HMO | Admitting: Gastroenterology

## 2019-10-10 ENCOUNTER — Encounter: Payer: Self-pay | Admitting: Gastroenterology

## 2019-10-10 VITALS — BP 144/83 | HR 79 | Temp 97.1°F | Ht 64.0 in | Wt 153.2 lb

## 2019-10-10 DIAGNOSIS — E559 Vitamin D deficiency, unspecified: Secondary | ICD-10-CM | POA: Diagnosis not present

## 2019-10-10 DIAGNOSIS — K6289 Other specified diseases of anus and rectum: Secondary | ICD-10-CM | POA: Diagnosis not present

## 2019-10-10 DIAGNOSIS — K219 Gastro-esophageal reflux disease without esophagitis: Secondary | ICD-10-CM

## 2019-10-10 DIAGNOSIS — L03314 Cellulitis of groin: Secondary | ICD-10-CM | POA: Diagnosis not present

## 2019-10-10 DIAGNOSIS — R1084 Generalized abdominal pain: Secondary | ICD-10-CM | POA: Diagnosis not present

## 2019-10-10 DIAGNOSIS — K52832 Lymphocytic colitis: Secondary | ICD-10-CM

## 2019-10-10 DIAGNOSIS — Z23 Encounter for immunization: Secondary | ICD-10-CM | POA: Diagnosis not present

## 2019-10-10 DIAGNOSIS — R69 Illness, unspecified: Secondary | ICD-10-CM | POA: Diagnosis not present

## 2019-10-10 DIAGNOSIS — J305 Allergic rhinitis due to food: Secondary | ICD-10-CM | POA: Diagnosis not present

## 2019-10-10 DIAGNOSIS — Z0001 Encounter for general adult medical examination with abnormal findings: Secondary | ICD-10-CM | POA: Diagnosis not present

## 2019-10-10 DIAGNOSIS — L299 Pruritus, unspecified: Secondary | ICD-10-CM | POA: Diagnosis not present

## 2019-10-10 DIAGNOSIS — J019 Acute sinusitis, unspecified: Secondary | ICD-10-CM | POA: Diagnosis not present

## 2019-10-10 MED ORDER — CHOLESTYRAMINE 4 G PO PACK: 4.0000 g | PACK | Freq: Every day | ORAL | 5 refills | Status: DC

## 2019-10-10 NOTE — Patient Instructions (Addendum)
Continue Entocort 6 mg daily for now.  Stop Bentyl and start cholestyramine 4 g daily with breakfast at 10 AM.  Dosing of cholestyramine, Protonix, and levothyroxine needs to be specifically timed due to potential interactions.  Start taking levothyroxine in the morning at 6 AM.  Protonix at 9 AM.  Cholestyramine at 10 AM with breakfast.  Call me in 2 weeks with a progress report.  I would like to try decreasing your Entocort to 3 mg daily in the very near future.  We will follow up with you in the office in 8 weeks.  Call if you have questions or concerns prior.  Aliene Altes, PA-C [facility]

## 2019-10-10 NOTE — Assessment & Plan Note (Addendum)
74 year old female with history of lymphocytic colitis diagnosed in 2017.  Intermittently, she has been on Entocort for flares of diarrhea.  Most recently, started on Entocort 9 mg in October 2020 and has been unable to taper.  Most recently, in February 2021, Entocort was decreased to 6 mg daily.  We added Bentyl 10 mg daily, advised 1 times with meals, hold magnesium, and follow a lactose-free diet or take Lactaid pills prior to dairy consumption.  She has made the above changes and noted improvement in diarrhea with 1 soft formed BM daily unless she consumes fried/fatty foods.  Notes that if she eats prior to taking Entocort, she will have return of diarrhea.  She denies NSAID use.  No abdominal pain, bright red blood per rectum, or melena.  Colonoscopy up-to-date in August 2020 and due for repeat in 2025.  Suspect diarrhea is multifactorial in setting of lymphocytic colitis as well as likely bile salt diarrhea.  Had considered cholestyramine at last visit but was concerned about potential medication interactions with levothyroxine and Protonix so Bentyl started instead.  As patient has had cholecystectomy, I feel she would likely have greater benefit from cholestyramine rather than Bentyl. Also some concerns with bentyl considering her age. We discussed this extensively at today's visit and collectively decided we could adjust medication timing to accommodate cholestyramine.   Stop Bentyl and start cholestyramine 4 mg daily with breakfast at 10 AM.  Advised to take levothyroxine at 6 AM and Protonix at 9 AM. Continue Entocort 6 mg daily for now.  She will call in 2 weeks with a progress report with hopes of decreasing Entocort to 3 mg daily within the next 2 to 4 weeks. She is advised to continue following a low-fat diet. Lean meats. Only baked, broiled, or grilled.  Follow-up in 8 weeks.

## 2019-10-10 NOTE — Assessment & Plan Note (Signed)
Well-controlled on Protonix 40 mg daily.  As we are adding cholestyramine and she is also taking levothyroxine, we have adjusted timing of dosing.  Levothyroxine will be taken at 6 AM, Protonix at 9 AM, cholestyramine at 10 AM with breakfast.

## 2019-10-14 DIAGNOSIS — J018 Other acute sinusitis: Secondary | ICD-10-CM | POA: Diagnosis not present

## 2019-10-14 DIAGNOSIS — E039 Hypothyroidism, unspecified: Secondary | ICD-10-CM | POA: Diagnosis not present

## 2019-11-05 DIAGNOSIS — B370 Candidal stomatitis: Secondary | ICD-10-CM | POA: Diagnosis not present

## 2019-11-05 DIAGNOSIS — K52832 Lymphocytic colitis: Secondary | ICD-10-CM | POA: Diagnosis not present

## 2019-11-05 DIAGNOSIS — R350 Frequency of micturition: Secondary | ICD-10-CM | POA: Diagnosis not present

## 2019-11-05 DIAGNOSIS — R21 Rash and other nonspecific skin eruption: Secondary | ICD-10-CM | POA: Diagnosis not present

## 2019-11-07 ENCOUNTER — Telehealth: Payer: Self-pay | Admitting: Emergency Medicine

## 2019-11-07 DIAGNOSIS — Z008 Encounter for other general examination: Secondary | ICD-10-CM | POA: Diagnosis not present

## 2019-11-07 DIAGNOSIS — J449 Chronic obstructive pulmonary disease, unspecified: Secondary | ICD-10-CM | POA: Diagnosis not present

## 2019-11-07 DIAGNOSIS — B379 Candidiasis, unspecified: Secondary | ICD-10-CM | POA: Diagnosis not present

## 2019-11-07 DIAGNOSIS — N3281 Overactive bladder: Secondary | ICD-10-CM | POA: Diagnosis not present

## 2019-11-07 DIAGNOSIS — G8929 Other chronic pain: Secondary | ICD-10-CM | POA: Diagnosis not present

## 2019-11-07 DIAGNOSIS — K219 Gastro-esophageal reflux disease without esophagitis: Secondary | ICD-10-CM | POA: Diagnosis not present

## 2019-11-07 DIAGNOSIS — R69 Illness, unspecified: Secondary | ICD-10-CM | POA: Diagnosis not present

## 2019-11-07 DIAGNOSIS — E039 Hypothyroidism, unspecified: Secondary | ICD-10-CM | POA: Diagnosis not present

## 2019-11-07 DIAGNOSIS — E785 Hyperlipidemia, unspecified: Secondary | ICD-10-CM | POA: Diagnosis not present

## 2019-11-07 DIAGNOSIS — J309 Allergic rhinitis, unspecified: Secondary | ICD-10-CM | POA: Diagnosis not present

## 2019-11-07 DIAGNOSIS — M329 Systemic lupus erythematosus, unspecified: Secondary | ICD-10-CM | POA: Diagnosis not present

## 2019-11-07 NOTE — Telephone Encounter (Signed)
Pt called in and verified name and dob. Notified pt that the provider stated she is ok with her taking Entocort. When [place] last  saw her she advised her to  continue 6 mg daily and try decreasing to 3 mg in 2-4 weeks. If the 3 mg. Pt stated since she started it again yesterday she is taking one tab by mouth in the morning and one tab by mouth at night and it is working well for her. Pt stated she understood and thanked me for the call

## 2019-11-07 NOTE — Telephone Encounter (Signed)
Called lmom

## 2019-11-07 NOTE — Telephone Encounter (Signed)
Pt called and stated that she is on entocort 3 mg and it was working very well. She stated she d/c seven days ago and she can not control her bowel movements, she stated she has a disabled husband that she has to care for and she is unable to control her bowels long enough to care for him without it. Pt pcp doctor hall did refill her entocort 3 mg yesterday because pt states it was so bad. She stated she is taking it daily once a day in the morning. Pt stated she is also taking the questran 4g packets and they are not helping either. Pt does have an apt to be seen here in the office on 12/12/19

## 2019-11-07 NOTE — Telephone Encounter (Signed)
I am ok with her taking Entocort. When I saw her last, I had advised she continue 6 mg daily and try decreasing to 3 mg in 2-4 weeks. If the 3 mg was working well for her, I am ok with her continuing that for now.   Since starting Entocort yesterday, has she had any improvement?

## 2019-11-07 NOTE — Telephone Encounter (Signed)
She should be taking only once a day. Continue with 3 mg every morning and let me know if this doesn't continue to keep diarrhea under control.   Additional recommendations:  Lactose free diet or take lactaid pills prior to all dairy.  Low fat diet.  No fried foods or fast food meals.  All meats should be baked, broiled, or boiled.

## 2019-11-08 NOTE — Telephone Encounter (Signed)
Called lmom

## 2019-11-11 ENCOUNTER — Encounter: Payer: Self-pay | Admitting: Emergency Medicine

## 2019-11-11 DIAGNOSIS — I10 Essential (primary) hypertension: Secondary | ICD-10-CM | POA: Diagnosis not present

## 2019-11-11 DIAGNOSIS — E559 Vitamin D deficiency, unspecified: Secondary | ICD-10-CM | POA: Diagnosis not present

## 2019-11-11 DIAGNOSIS — R69 Illness, unspecified: Secondary | ICD-10-CM | POA: Diagnosis not present

## 2019-11-11 NOTE — Telephone Encounter (Signed)
Letter mailed

## 2019-11-11 NOTE — Telephone Encounter (Signed)
Called unable to leave vm because phone has been d/c

## 2019-11-21 DIAGNOSIS — E559 Vitamin D deficiency, unspecified: Secondary | ICD-10-CM | POA: Diagnosis not present

## 2019-11-21 DIAGNOSIS — J018 Other acute sinusitis: Secondary | ICD-10-CM | POA: Diagnosis not present

## 2019-11-21 DIAGNOSIS — B370 Candidal stomatitis: Secondary | ICD-10-CM | POA: Diagnosis not present

## 2019-11-21 DIAGNOSIS — J305 Allergic rhinitis due to food: Secondary | ICD-10-CM | POA: Diagnosis not present

## 2019-11-21 DIAGNOSIS — H65199 Other acute nonsuppurative otitis media, unspecified ear: Secondary | ICD-10-CM | POA: Diagnosis not present

## 2019-11-21 DIAGNOSIS — R69 Illness, unspecified: Secondary | ICD-10-CM | POA: Diagnosis not present

## 2019-11-21 DIAGNOSIS — J329 Chronic sinusitis, unspecified: Secondary | ICD-10-CM | POA: Diagnosis not present

## 2019-11-21 DIAGNOSIS — L239 Allergic contact dermatitis, unspecified cause: Secondary | ICD-10-CM | POA: Diagnosis not present

## 2019-11-21 DIAGNOSIS — I10 Essential (primary) hypertension: Secondary | ICD-10-CM | POA: Diagnosis not present

## 2019-11-21 DIAGNOSIS — J019 Acute sinusitis, unspecified: Secondary | ICD-10-CM | POA: Diagnosis not present

## 2019-12-10 DIAGNOSIS — J019 Acute sinusitis, unspecified: Secondary | ICD-10-CM | POA: Diagnosis not present

## 2019-12-10 DIAGNOSIS — I10 Essential (primary) hypertension: Secondary | ICD-10-CM | POA: Diagnosis not present

## 2019-12-10 DIAGNOSIS — R0602 Shortness of breath: Secondary | ICD-10-CM | POA: Diagnosis not present

## 2019-12-10 DIAGNOSIS — J018 Other acute sinusitis: Secondary | ICD-10-CM | POA: Diagnosis not present

## 2019-12-10 DIAGNOSIS — E559 Vitamin D deficiency, unspecified: Secondary | ICD-10-CM | POA: Diagnosis not present

## 2019-12-10 DIAGNOSIS — B370 Candidal stomatitis: Secondary | ICD-10-CM | POA: Diagnosis not present

## 2019-12-10 DIAGNOSIS — J305 Allergic rhinitis due to food: Secondary | ICD-10-CM | POA: Diagnosis not present

## 2019-12-10 DIAGNOSIS — J329 Chronic sinusitis, unspecified: Secondary | ICD-10-CM | POA: Diagnosis not present

## 2019-12-10 DIAGNOSIS — H65199 Other acute nonsuppurative otitis media, unspecified ear: Secondary | ICD-10-CM | POA: Diagnosis not present

## 2019-12-10 DIAGNOSIS — R69 Illness, unspecified: Secondary | ICD-10-CM | POA: Diagnosis not present

## 2019-12-10 DIAGNOSIS — R002 Palpitations: Secondary | ICD-10-CM | POA: Diagnosis not present

## 2019-12-10 DIAGNOSIS — R600 Localized edema: Secondary | ICD-10-CM | POA: Diagnosis not present

## 2019-12-11 NOTE — Progress Notes (Signed)
Primary Care Physician:  Celene Squibb, MD  Primary GI: Dr. Gala Romney  Patient Location: Home   Provider Location: [facility] office   Reason for Visit: GERD, lymphocytic colitis F/U   Persons present on the virtual encounter, with roles: Aliene Altes, PA-C (Provider); Jennifer [name] (Patient)   Total time (minutes) spent on medical discussion: 17 minutes  Virtual Visit via Telephone Note Due to COVID-19, visit is conducted virtually and was requested by patient.   I connected with Jennifer [name] on 12/12/19 at  8:00 AM EDT by telephone and verified that I am speaking with the correct person using two identifiers.   I discussed the limitations, risks, security and privacy concerns of performing an evaluation and management service by telephone and the availability of in person appointments. I also discussed with the patient that there may be a patient responsible charge related to this service. The patient expressed understanding and agreed to proceed.  Chief Complaint  Patient presents with  . Gastroesophageal Reflux    f/u. doing okay  . lymphocytic colilitis    c/o diarrhea and does not feel like entocort is helping this time     History of Present Illness: 74 y.o. female  with history of GERD and lymphocytic colitis diagnosed in 2017. Historically, she has been on Entocort intermittently for flares but has not been able to taper off Entocort since October 2020. Last colonoscopy on 03/06/2019 with 2 tubular adenomas and diverticulosis.  Recommendations to repeat in 5 years. Also with acute uncomplicated diverticulitis in mid sigmoid colon on 07/22/19 s/p treatment with cipro and flagyl x 10 days.    Last seen in our office for the same on 10/10/2019. GERD was well controlled on Protonix 40 mg daily. Had tapered Entocort to 6 mg daily back in February and added Bentyl as well as Tums with meals. With Entocort 6 mg, Bentyl once daily, and 1 tums before meals, stopping magnesium, and following a  lactose free diet, diarrhea had improved with 1 BM daily that was usually formed. Still had to avoid fried/fatty foods. It was felt diarrhea was likely multifactorial in setting of lymphocytic colitis as well as likely bile salt diarrhea. We discussed extensively continuing Bentyl and possible side effects considering her age vs trying cholestyramine. Ultimately patient was interested in trying cholestyramine. Cholestyramine 4 mg daily with breakfast at 10 AM.  Advised to take levothyroxine at 6 AM and Protonix at 9 AM. Continue Entocort 6 mg daily for now.  She will call in 2 weeks with a progress report with hopes of decreasing Entocort to 3 mg daily within the next 2 to 4 weeks. Continue following low-fat diet.   Received call on 11/07/19. Patient stated she had bveen doing well on Entocort 3 mg daily. Had d/c seven days ago and she could not control her bowel movements. Patient was advised to resume Entocort 3 mg daily. Patient reported taking in the am and pm. Tried to contact her to let her know to take once in the morning but was unable to reach her. Letter was mailed.    Today:   GERD: Doing well. No nausea or vomiting. No abdominal pain or trouble swallowing.   Diarrhea: Taking 3 Entocort a day. Not taking questran. Not taking Bentyl. Taking 1 tums before meals. 4 BMs daily. Sometimes she can't control it. Associated urgency. Very mushy/loose and watery at times. No nocturnal BMs. Husband went in the hospital about 2 weeks ago and noted worsening diarrhea at that  time. Felt Bentyl worked much better with the Entocort than anything else. Has been on bentyl 9 mg for 3 weeks. No significant improvement thus far. No blood in the stool or black stool. No fried/fatty/greasy foods. Eating air fried chicken and oatmeal. These are the only things that don't go straight through her. Following lactose free diet. Having gas as well. Not using Gas-X. Has been eating salads. This worsens gas/bloating.   Questran  didn't work. Had no improvement when she tried this.    Was on amoxicillin within the last 3 weeks for sinus trouble. No travel, sick contacts, or well water consumption.   No NSAIDs.   Had TSH checked with PCP. Doesn't know result.  Past Medical History:  Diagnosis Date  . Depression   . GERD (gastroesophageal reflux disease)   . History of colon polyps    remote past, unable to retrieve records   . Hyperlipidemia   . Hypothyroidism   . Rheumatoid arthritis [facility]      Past Surgical History:  Procedure Laterality Date  . COLONOSCOPY     last one was about 3 yrs in [place]  . COLONOSCOPY N/A 02/19/2016   Dr. Gala Romney: three 3-6 mm polyps (tubular adenomas) in ascending colon, segmental biopsies consistent with lymphocytic colitis. Prescribed entocort.   . COLONOSCOPY N/A 03/06/2019   Dr. Gala Romney: 2 polyps removed, tubular adenomas, diverticulosis.  NO random colon biopsies performed.  Next colonoscopy 5 years.  . ESOPHAGOGASTRODUODENOSCOPY N/A 08/11/2014   RMR: MIld erosive  reflux esophagitis. small hiatal hernia. Abnormal gastric muocsa uncertain significance. Subtly abnormal duodeanl (bulbar) mucosa- status post multiple biopsies. BENIGN small bowel mucosa, no evidence of villous blulnting, mild reactive gastropathy on stomach biopsy   . GALLBLADDER SURGERY    . LEG SURGERY     right  . PARTIAL HYSTERECTOMY       Current Meds  Medication Sig  . albuterol (VENTOLIN HFA) 108 (90 Base) MCG/ACT inhaler Inhale 2 puffs into the lungs every 6 (six) hours as needed for wheezing or shortness of breath.  [name] ELLIPTA 62.5-25 MCG/INH AEPB Inhale 1 puff into the lungs daily.  . budesonide (ENTOCORT EC) 3 MG 24 hr capsule Take 2 capsules (6 mg total) by mouth daily. (Patient taking differently: Take 9 mg by mouth daily. )  . Calcium Carb-Cholecalciferol (CALCIUM 600 + D PO) Take 2 tablets by mouth daily.  Marland Kitchen Dextromethorphan-guaiFENesin (MUCINEX DM MAXIMUM STRENGTH) 60-1200 MG TB12  Take 1 tablet by mouth as needed.   . fluticasone (FLONASE) 50 MCG/ACT nasal spray Place 2 sprays into both nostrils 2 (two) times daily as needed for allergies.   Marland Kitchen levothyroxine (SYNTHROID, LEVOTHROID) 100 MCG tablet Take 100 mcg by mouth daily before breakfast.  . methocarbamol (ROBAXIN) 500 MG tablet Take 500 mg by mouth as needed for muscle spasms.   . mirabegron ER (MYRBETRIQ) 25 MG TB24 tablet Take 25 mg by mouth at bedtime.  . pantoprazole (PROTONIX) 40 MG tablet TAKE ONE TABLET BY MOUTH ONCE DAILY BEFORE BREAKFAST. (Patient taking differently: Take 40 mg by mouth daily. )  . simvastatin (ZOCOR) 40 MG tablet Take 40 mg by mouth every evening.     Family History  Problem Relation Age of Onset  . Colon cancer Neg Hx     Social History   Socioeconomic History  . Marital status: Married    Spouse name: Not on file  . Number of children: 2  . Years of education: Not on file  . Highest  education level: Not on file  Occupational History  . Occupation: disability  Tobacco Use  . Smoking status: Current Every Day Smoker    Packs/day: 0.25    Types: Cigarettes  . Smokeless tobacco: Never Used  . Tobacco comment: quit 03/31/18  Substance and Sexual Activity  . Alcohol use: No    Alcohol/week: 0.0 standard drinks  . Drug use: No  . Sexual activity: Not on file  Other Topics Concern  . Not on file  Social History Narrative  . Not on file   Social Determinants of Health   Financial Resource Strain:   . Difficulty of Paying Living Expenses:   Food Insecurity:   . Worried About Charity fundraiser in the Last Year:   . Arboriculturist in the Last Year:   Transportation Needs:   . Film/video editor (Medical):   Marland Kitchen Lack of Transportation (Non-Medical):   Physical Activity:   . Days of Exercise per Week:   . Minutes of Exercise per Session:   Stress:   . Feeling of Stress :   Social Connections:   . Frequency of Communication with Friends and Family:   . Frequency of  Social Gatherings with Friends and Family:   . Attends Religious Services:   . Active Member of Clubs or Organizations:   . Attends Archivist Meetings:   Marland Kitchen Marital Status:      Review of Systems: Gen: Denies fever, chills, cold or flulike symptoms, lightheadedness, dizziness, presyncope, syncope. CV: Denies chest pain.  Admits to heart palpitations.  Also reports swelling in her legs and feet.  PCP has referred her to cardiology.  She does not have an appointment arranged yet. Resp: Denies dyspnea at rest.  Admits to dyspnea with exertion. Intermittent cough with sinus trouble.  GI: see HPI Derm: Denies rash Psych: Admits depression/ anxiety  Heme: See HPI  Observations/Objective: No distress. Alert and oriented. Pleasant. Unable to perform complete physical exam due to telephone encounter. No video available.   Assessment and Plan: 74 year old female with history GERD and lymphocytic colitis presenting for follow-up. History if also significant for hypothyroidism, depression, and RA.   Diarrhea: Lymphocytic colitis diagnosed in 2017. Intermittently, she has been on Entocort for flares of diarrhea.  More recently, she was started on Entocort 9 mg in October 2020 and has been unable to taper off.  She had reduced to Entocort 3 mg daily and seemed to be doing fairly well but had a flare of diarrhea starting in April.  PCP increased Entocort 9 mg about 3 weeks ago and she has not had any improvement in diarrhea.  She continues with 4 loose to watery BMs daily that she cannot control at times due to urgency. Also with increased gas/bloating. Notes diarrhea worsened around the time her husband went in the hospital a few weeks ago.  Also admits to being on amoxicillin recently.  Trial of Questran did not help diarrhea historically.  She was tried on Bentyl in the past which she felt has helped the most aside from Entocort.  Denies abdominal pain, BRBPR, or melena. No NSAIDs. Reports having  TSH checked by PCP recently but is unaware of result. Last colonoscopy August 2020 and due for repeat in 2025.  Diarrhea is likely multifactorial in the setting of lymphocytic colitis, possible bile salt diarrhea with history of cholecystectomy, possible IBS component with diarrhea worsening with anxiety/stress, SIBO, Thyroid abnormalities. However, as her diarrhea isn't improving with Entocort as  it has historically and she is recently been on antibiotics, will plan to rule out infectious process prior to making any medication changes.   GERD: Well-controlled on Protonix 40 mg daily.  No alarm symptoms. Advise she continue her current medications.   Plan:  Request recent labs from PCP.  C. difficile and GI pathogen panel. Continue Entocort 9 mg daily. Continue Low-fat diet. Counseled on this.  Continue Lactose-free diet. Counseled on this.  Avoid items that cause gas and bloating.  Counseled on this.  Handout provided. Continue using Gas-X as needed. May trying Beano before meals. Continue to avoid all NSAIDs.  Continue Protonix 40 mg daily 30 minutes before breakfast.   Follow-up in 2 months. Call with questions or concerns prior.   *If stool studies are negative may consider empiric treatment of SIBO.   Follow Up Instructions: Follow-up in 2 months.    I discussed the assessment and treatment plan with the patient. The patient was provided an opportunity to ask questions and all were answered. The patient agreed with the plan and demonstrated an understanding of the instructions.   The patient was advised to call back or seek an in-person evaluation if the symptoms worsen or if the condition fails to improve as anticipated.  I provided 17 minutes of non-face-to-face time during this encounter.  Aliene Altes, PA-C [facility]

## 2019-12-12 ENCOUNTER — Encounter: Payer: Self-pay | Admitting: Internal Medicine

## 2019-12-12 ENCOUNTER — Telehealth: Payer: Self-pay

## 2019-12-12 ENCOUNTER — Other Ambulatory Visit (HOSPITAL_COMMUNITY): Payer: Self-pay | Admitting: Internal Medicine

## 2019-12-12 ENCOUNTER — Encounter: Payer: Self-pay | Admitting: Gastroenterology

## 2019-12-12 ENCOUNTER — Ambulatory Visit (INDEPENDENT_AMBULATORY_CARE_PROVIDER_SITE_OTHER): Payer: Medicare HMO | Admitting: Gastroenterology

## 2019-12-12 ENCOUNTER — Telehealth: Payer: Self-pay | Admitting: Gastroenterology

## 2019-12-12 ENCOUNTER — Other Ambulatory Visit: Payer: Self-pay

## 2019-12-12 DIAGNOSIS — K52832 Lymphocytic colitis: Secondary | ICD-10-CM | POA: Diagnosis not present

## 2019-12-12 DIAGNOSIS — K219 Gastro-esophageal reflux disease without esophagitis: Secondary | ICD-10-CM | POA: Diagnosis not present

## 2019-12-12 DIAGNOSIS — R197 Diarrhea, unspecified: Secondary | ICD-10-CM

## 2019-12-12 DIAGNOSIS — R059 Cough, unspecified: Secondary | ICD-10-CM

## 2019-12-12 NOTE — Telephone Encounter (Signed)
Jennifer [name], please let patient know I have received and reviewed her recent labs completed with PCP 12/10/19.  WBCs elevated at 16.3, hemoglobin elevated at 17.3, platelets normal at 241.  Sodium high 147, potassium low 2.9, kidney function and LFTs within normal limits.  TSH on the very low end of normal at 0.5, free T4 elevated at 2.3.  Per prior phone note, patient's PCP told her she had an infection with plans to treat with an antibiotic.  Not sure what her PCP is treating her for.  It is very important that she completes the stool studies I ordered as she may have infection in her stool.  Her low potassium is likely secondary to her ongoing diarrhea and PCP is supplementing this.  Elevated hemoglobin and sodium may be secondary to mild dehydration.  She needs to be sure she is drinking enough water to keep urine pale yellow to clear.  Recommend she add liquids with electrolytes such as G2 or Pedialyte to help replace electrolyte losses through her diarrhea.

## 2019-12-12 NOTE — Assessment & Plan Note (Addendum)
Addressed under diarrhea 

## 2019-12-12 NOTE — Telephone Encounter (Signed)
Pt called back after her telephone apt today. Pt received a call from her PCP stating that her blood work showed infection. Pt will need to start antibiotics and potassium pills. Pt isn't sure if [facility] would like for her to take Entocort while on the antibiotics? Pt wants to make sure she takes the medication correctly. Pt did state that she isn't sure what type of infection she has.

## 2019-12-12 NOTE — Telephone Encounter (Signed)
Yes, continue taking Entocort. There is minimal systemic effect with this.

## 2019-12-12 NOTE — Telephone Encounter (Signed)
Spoke with pt. Pt notified of [place] results.

## 2019-12-12 NOTE — Assessment & Plan Note (Deleted)
Well-controlled on Protonix 40 mg daily.  No alarm symptoms. Advise she continue her current medications.

## 2019-12-12 NOTE — Patient Instructions (Addendum)
Please have stool studies completed.   Continue Entocort 9 mg daily for now.   Continue following a low-fat diet. Avoid fried, fatty, greasy foods. All meats should be lean (poultry or fish) and baked, boiled, or broiled.  Continue following a lactose-free diet or taking Lactaid pills prior to any dairy consumption.  Avoid items that cause gas and bloating including broccoli, cauliflower, cabbage, Brussels sprouts, beans, carbonated beverages, artificial sweeteners, chewing gum, drinking through a straw. See handout below.  Continue using Gas-X as needed. You may consider trying Beano before meals.  Continue to avoid all NSAIDs.   Continue Protoinx 40 mg daily 30 minutes before breakfast.   I'll call you with results of your stool studies and have further recommendations at that time.  We will plan to follow-up with you in 2 months. Call with questions or concerns prior.  Aliene Altes, PA-C [facility]       Abdominal Bloating When you have abdominal bloating, your abdomen may feel full, tight, or painful. It may also look bigger than normal or swollen (distended). Common causes of abdominal bloating include:  Swallowing air.  Constipation.  Problems digesting food.  Eating too much.  Irritable bowel syndrome. This is a condition that affects the large intestine.  Lactose intolerance. This is an inability to digest lactose, a natural sugar in dairy products.  Celiac disease. This is a condition that affects the ability to digest gluten, a protein found in some grains.  Gastroparesis. This is a condition that slows down the movement of food in the stomach and small intestine. It is more common in people with diabetes mellitus.  Gastroesophageal reflux disease (GERD). This is a digestive condition that makes stomach acid flow back into the esophagus.  Urinary retention. This means that the body is holding onto urine, and the bladder cannot be emptied  all the way. Follow these instructions at home: Eating and drinking  Avoid eating too much.  Try not to swallow air while talking or eating.  Avoid eating while lying down.  Avoid these foods and drinks: ? Foods that cause gas, such as broccoli, cabbage, cauliflower, and baked beans. ? Carbonated drinks. ? Hard candy. ? Chewing gum. Medicines  Take over-the-counter and prescription medicines only as told by your health care provider.  Take probiotic medicines. These medicines contain live bacteria or yeasts that can help digestion.  Take coated peppermint oil capsules. Activity  Try to exercise regularly. Exercise may help to relieve bloating that is caused by gas and relieve constipation. General instructions  Keep all follow-up visits as told by your health care provider. This is important. Contact a health care provider if:  You have nausea and vomiting.  You have diarrhea.  You have abdominal pain.  You have unusual weight loss or weight gain.  You have severe pain, and medicines do not help. Get help right away if:  You have severe chest pain.  You have trouble breathing.  You have shortness of breath.  You have trouble urinating.  You have darker urine than normal.  You have blood in your stools or have dark, tarry stools. Summary  Abdominal bloating means that the abdomen is swollen.  Common causes of abdominal bloating are swallowing air, constipation, and problems digesting food.  Avoid eating too much and avoid swallowing air.  Avoid foods that cause gas, carbonated drinks, hard candy, and chewing gum. This information is not intended to replace advice given to you by your health care provider. Make  sure you discuss any questions you have with your health care provider. Document Revised: 11/05/2018 Document Reviewed: 08/19/2016 Elsevier Patient Education  [place].

## 2019-12-13 NOTE — Telephone Encounter (Signed)
Spoke with pt. Pt is going to call back. She wasn't able to talk when I called.

## 2019-12-13 NOTE — Telephone Encounter (Signed)
Spoke with pt. Pt notified of results. Pt notified of recommendations by [facility] and will keep hydrated and complete her stool testing.

## 2019-12-20 IMAGING — US US ABDOMEN COMPLETE
1 series · 13 of 25 positions shown · non-contrast
Comparison: None in PACs

CLINICAL DATA: Abnormal liver function studies; right flank pain
for the past 6 months; previous cholecystectomy. Hyperlipidemia.

EXAM:
ABDOMEN ULTRASOUND COMPLETE

[Series 1: us abdomen complete · 0.24mm/px · 13 of 78 slices shown]
[im 1/78]
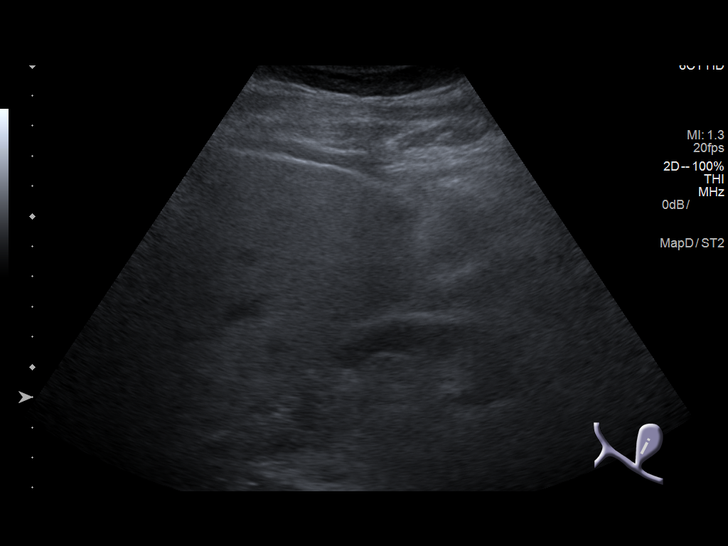
[im 7/78]
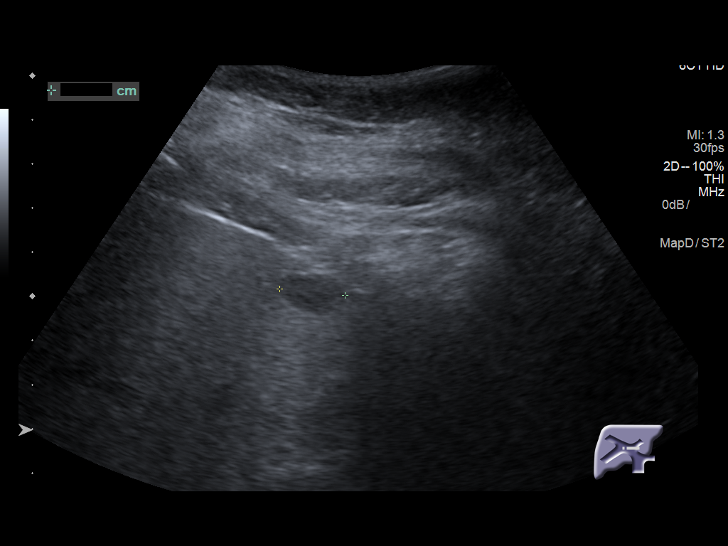
[im 13/78]
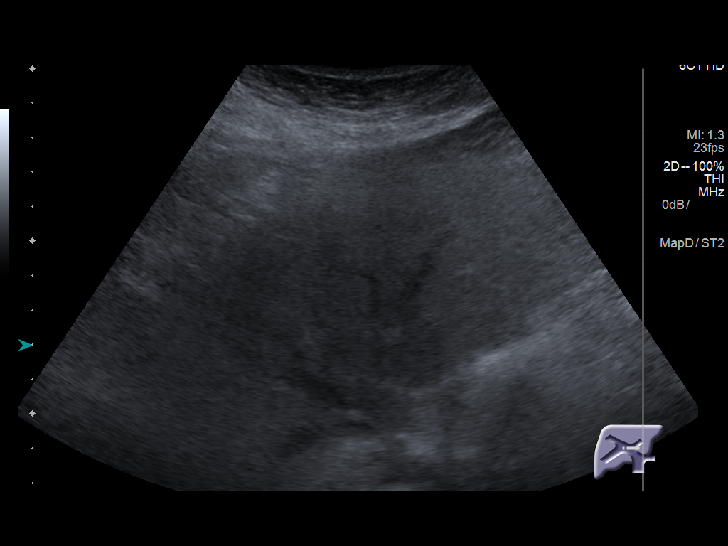
[im 20/78]
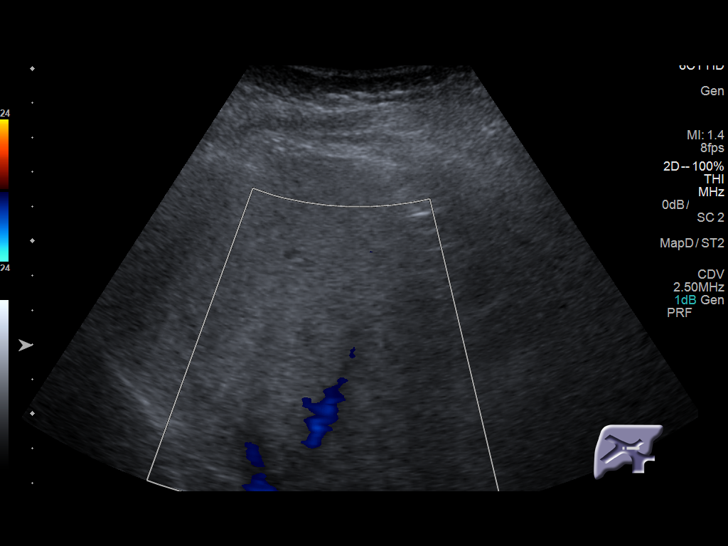
[im 26/78]
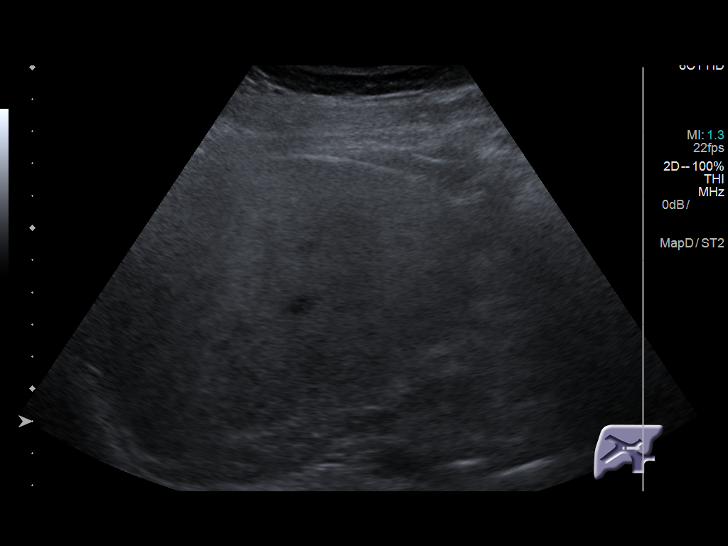
[im 33/78]
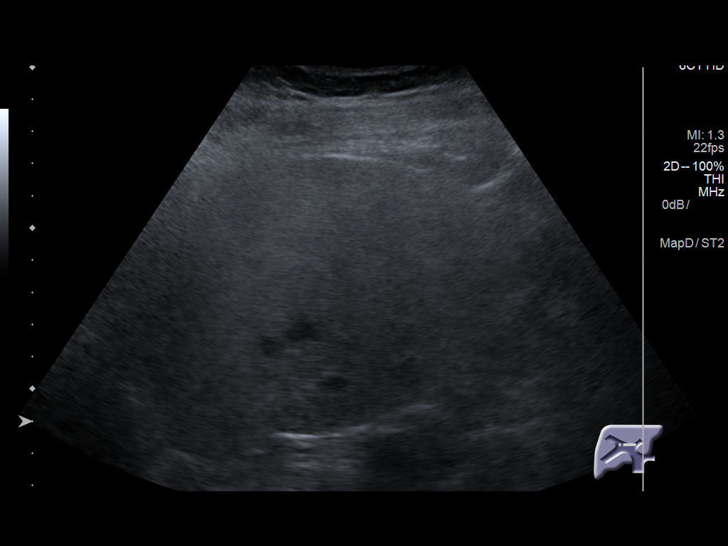
[im 39/78]
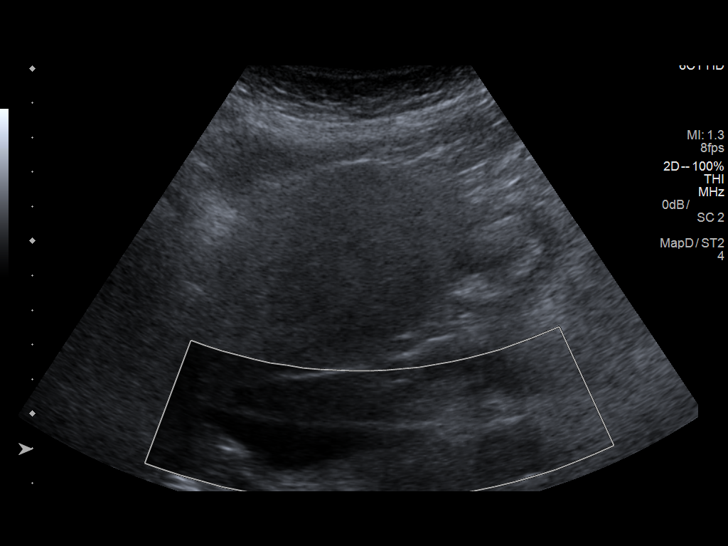
[im 45/78]
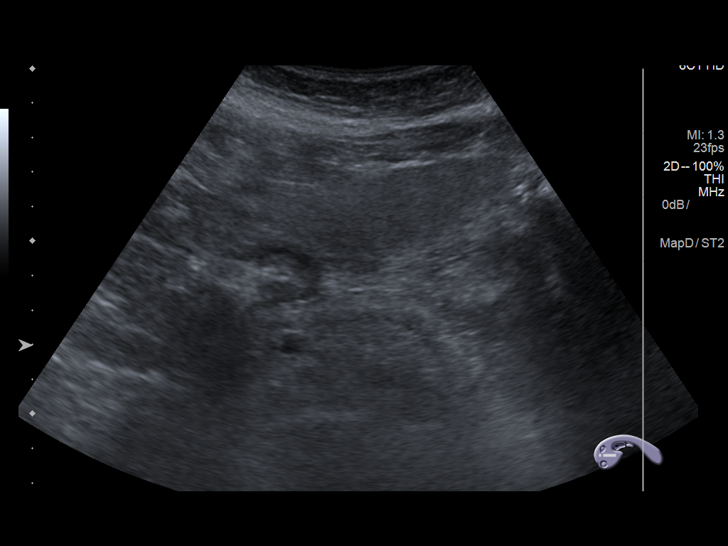
[im 52/78]
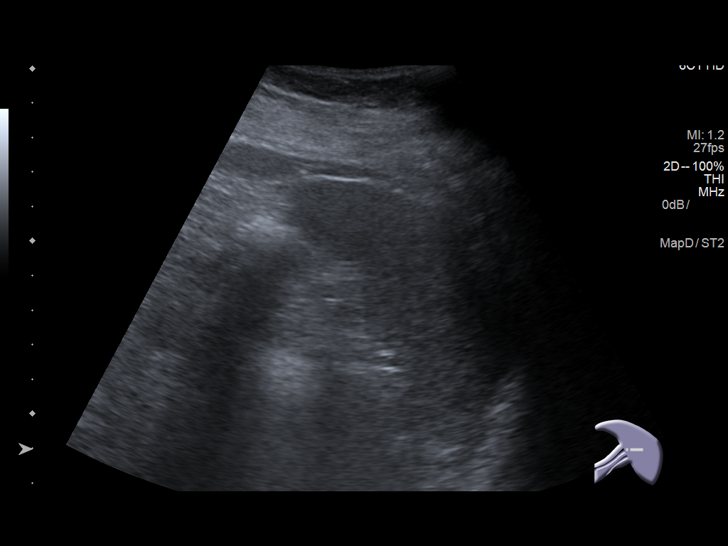
[im 58/78]
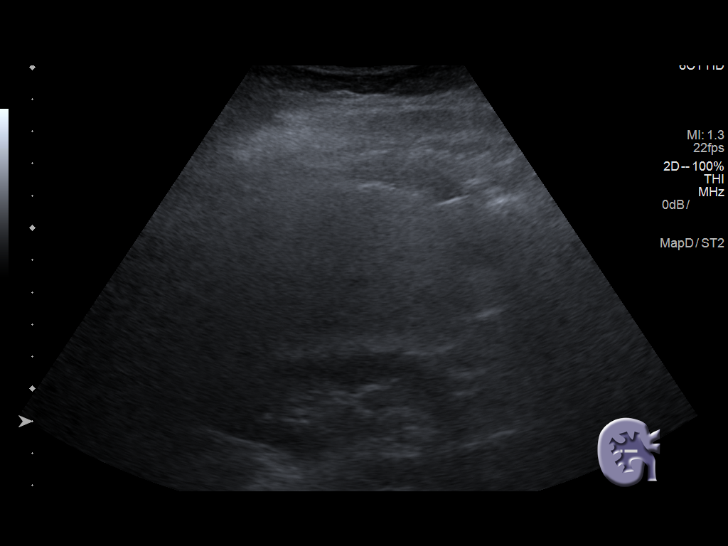
[im 65/78]
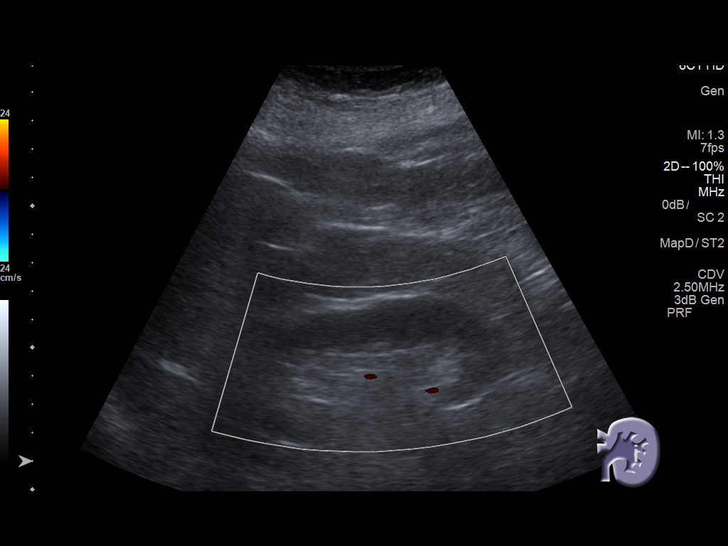
[im 71/78]
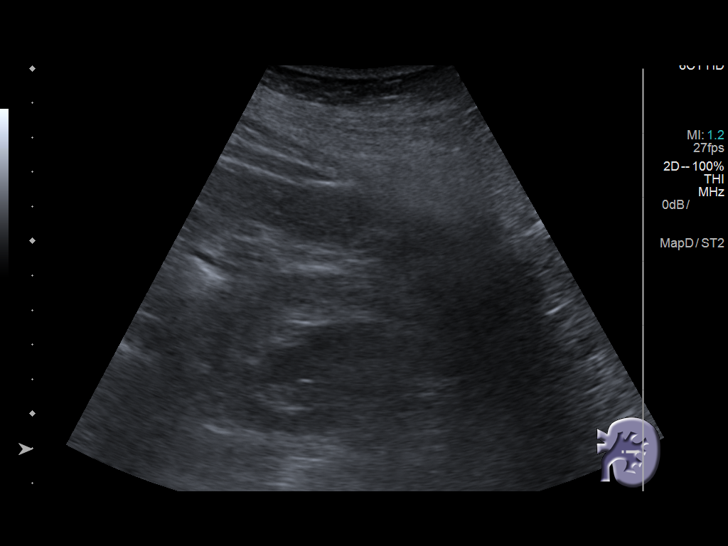
[im 78/78]
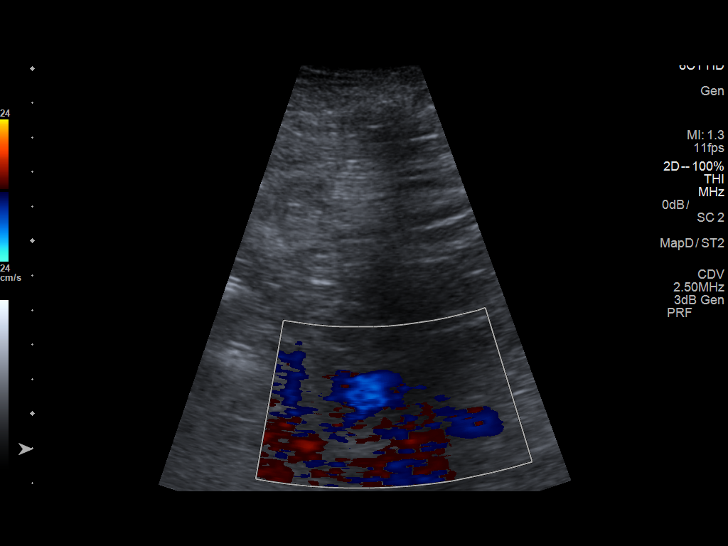

[13 of 25 positions shown; findings below may reference images not displayed]

FINDINGS: Gallbladder: The gallbladder is surgically absent.

Common bile duct: Diameter: 14 mm

Liver: The hepatic echotexture is mildly increased diffusely. The
surface contour is smooth. There is a left lobe cyst measuring 1.2 x
0.8 x 1.5 cm. There is no intrahepatic ductal dilation. Portal vein
is patent on color Doppler imaging with normal direction of blood
flow towards the liver.

IVC: No abnormality visualized.

Pancreas: Visualized portion unremarkable.

Spleen: Size and appearance within normal limits.

Right Kidney: Length: 9.1 cm. Echogenicity within normal limits. No
mass or hydronephrosis visualized.

Left Kidney: Length: 10.1 cm. Echogenicity within normal limits. No
mass or hydronephrosis visualized.

Abdominal aorta: Bowel gas limits evaluation of the mid aorta. The
proximal and distal aorta exhibit no aneurysm.

Other findings: There is no ascites.
IMPRESSION: Mildly increased hepatic echotexture most compatible with fatty
infiltrative change. Probable cyst in the left lobe with dimensions
as given above. A few internal echoes are observed. Hepatic protocol
MRI is recommended to assure that this reflects a simple or
proteinaceous material-filled cyst.

Previous cholecystectomy.

## 2019-12-24 DIAGNOSIS — E559 Vitamin D deficiency, unspecified: Secondary | ICD-10-CM | POA: Diagnosis not present

## 2019-12-24 DIAGNOSIS — R69 Illness, unspecified: Secondary | ICD-10-CM | POA: Diagnosis not present

## 2019-12-24 DIAGNOSIS — I10 Essential (primary) hypertension: Secondary | ICD-10-CM | POA: Diagnosis not present

## 2019-12-25 ENCOUNTER — Telehealth: Payer: Self-pay | Admitting: *Deleted

## 2019-12-25 NOTE — Telephone Encounter (Signed)
Jennifer [name], you are scheduled for a virtual visit with your provider today.  Just as we do with appointments in the office, we must obtain your consent to participate.  Your consent will be active for this visit and any virtual visit you may have with one of our providers in the next 365 days.  If you have a MyChart account, I can also send a copy of this consent to you electronically.  All virtual visits are billed to your insurance company just like a traditional visit in the office.  As this is a virtual visit, video technology does not allow for your provider to perform a traditional examination.  This may limit your provider's ability to fully assess your condition.  If your provider identifies any concerns that need to be evaluated in person or the need to arrange testing such as labs, EKG, etc, we will make arrangements to do so.  Although advances in technology are sophisticated, we cannot ensure that it will always work on either your end or our end.  If the connection with a video visit is poor, we may have to switch to a telephone visit.  With either a video or telephone visit, we are not always able to ensure that we have a secure connection.   I need to obtain your verbal consent now.   Are you willing to proceed with your visit today?

## 2019-12-25 NOTE — Telephone Encounter (Signed)
Pt consented to a telephone visit on 12/12/19.

## 2020-01-03 ENCOUNTER — Encounter: Payer: Self-pay | Admitting: *Deleted

## 2020-01-06 ENCOUNTER — Ambulatory Visit: Payer: Medicare HMO | Admitting: Cardiology

## 2020-01-08 DIAGNOSIS — J069 Acute upper respiratory infection, unspecified: Secondary | ICD-10-CM | POA: Diagnosis not present

## 2020-01-08 DIAGNOSIS — E785 Hyperlipidemia, unspecified: Secondary | ICD-10-CM | POA: Diagnosis not present

## 2020-01-08 DIAGNOSIS — J441 Chronic obstructive pulmonary disease with (acute) exacerbation: Secondary | ICD-10-CM | POA: Diagnosis not present

## 2020-01-13 DIAGNOSIS — E782 Mixed hyperlipidemia: Secondary | ICD-10-CM | POA: Diagnosis not present

## 2020-01-13 DIAGNOSIS — J018 Other acute sinusitis: Secondary | ICD-10-CM | POA: Diagnosis not present

## 2020-01-13 DIAGNOSIS — E1169 Type 2 diabetes mellitus with other specified complication: Secondary | ICD-10-CM | POA: Diagnosis not present

## 2020-01-16 ENCOUNTER — Other Ambulatory Visit (HOSPITAL_COMMUNITY): Payer: Self-pay | Admitting: Internal Medicine

## 2020-01-16 DIAGNOSIS — J441 Chronic obstructive pulmonary disease with (acute) exacerbation: Secondary | ICD-10-CM

## 2020-01-16 DIAGNOSIS — R059 Cough, unspecified: Secondary | ICD-10-CM

## 2020-01-17 ENCOUNTER — Ambulatory Visit (HOSPITAL_COMMUNITY)
Admission: RE | Admit: 2020-01-17 | Discharge: 2020-01-17 | Disposition: A | Discharge status: Home / Self Care | Payer: Medicare HMO | Source: Ambulatory Visit | Attending: Internal Medicine | Admitting: Internal Medicine

## 2020-01-17 ENCOUNTER — Other Ambulatory Visit: Payer: Self-pay

## 2020-01-17 DIAGNOSIS — R05 Cough: Secondary | ICD-10-CM | POA: Insufficient documentation

## 2020-01-17 DIAGNOSIS — J305 Allergic rhinitis due to food: Secondary | ICD-10-CM | POA: Diagnosis not present

## 2020-01-17 DIAGNOSIS — R69 Illness, unspecified: Secondary | ICD-10-CM | POA: Diagnosis not present

## 2020-01-17 DIAGNOSIS — I10 Essential (primary) hypertension: Secondary | ICD-10-CM | POA: Diagnosis not present

## 2020-01-17 DIAGNOSIS — J449 Chronic obstructive pulmonary disease, unspecified: Secondary | ICD-10-CM | POA: Diagnosis not present

## 2020-01-17 DIAGNOSIS — J441 Chronic obstructive pulmonary disease with (acute) exacerbation: Secondary | ICD-10-CM

## 2020-01-17 DIAGNOSIS — J018 Other acute sinusitis: Secondary | ICD-10-CM | POA: Diagnosis not present

## 2020-01-17 DIAGNOSIS — H65199 Other acute nonsuppurative otitis media, unspecified ear: Secondary | ICD-10-CM | POA: Diagnosis not present

## 2020-01-17 DIAGNOSIS — R7301 Impaired fasting glucose: Secondary | ICD-10-CM | POA: Diagnosis not present

## 2020-01-17 DIAGNOSIS — E559 Vitamin D deficiency, unspecified: Secondary | ICD-10-CM | POA: Diagnosis not present

## 2020-01-17 DIAGNOSIS — J329 Chronic sinusitis, unspecified: Secondary | ICD-10-CM | POA: Diagnosis not present

## 2020-01-17 DIAGNOSIS — B370 Candidal stomatitis: Secondary | ICD-10-CM | POA: Diagnosis not present

## 2020-01-17 DIAGNOSIS — R059 Cough, unspecified: Secondary | ICD-10-CM

## 2020-01-17 DIAGNOSIS — J019 Acute sinusitis, unspecified: Secondary | ICD-10-CM | POA: Diagnosis not present

## 2020-01-17 DIAGNOSIS — J984 Other disorders of lung: Secondary | ICD-10-CM | POA: Diagnosis not present

## 2020-01-21 DIAGNOSIS — R600 Localized edema: Secondary | ICD-10-CM | POA: Diagnosis not present

## 2020-01-21 DIAGNOSIS — R7301 Impaired fasting glucose: Secondary | ICD-10-CM | POA: Diagnosis not present

## 2020-01-21 DIAGNOSIS — R945 Abnormal results of liver function studies: Secondary | ICD-10-CM | POA: Diagnosis not present

## 2020-01-21 DIAGNOSIS — J449 Chronic obstructive pulmonary disease, unspecified: Secondary | ICD-10-CM | POA: Diagnosis not present

## 2020-01-21 DIAGNOSIS — J019 Acute sinusitis, unspecified: Secondary | ICD-10-CM | POA: Diagnosis not present

## 2020-01-21 DIAGNOSIS — E782 Mixed hyperlipidemia: Secondary | ICD-10-CM | POA: Diagnosis not present

## 2020-01-21 DIAGNOSIS — K52832 Lymphocytic colitis: Secondary | ICD-10-CM | POA: Diagnosis not present

## 2020-01-21 DIAGNOSIS — Z0001 Encounter for general adult medical examination with abnormal findings: Secondary | ICD-10-CM | POA: Diagnosis not present

## 2020-01-21 DIAGNOSIS — E039 Hypothyroidism, unspecified: Secondary | ICD-10-CM | POA: Diagnosis not present

## 2020-01-21 DIAGNOSIS — E46 Unspecified protein-calorie malnutrition: Secondary | ICD-10-CM | POA: Diagnosis not present

## 2020-02-04 DIAGNOSIS — L309 Dermatitis, unspecified: Secondary | ICD-10-CM | POA: Diagnosis not present

## 2020-02-04 DIAGNOSIS — L821 Other seborrheic keratosis: Secondary | ICD-10-CM | POA: Diagnosis not present

## 2020-02-06 ENCOUNTER — Encounter: Payer: Self-pay | Admitting: Cardiology

## 2020-02-06 ENCOUNTER — Other Ambulatory Visit: Payer: Self-pay

## 2020-02-06 ENCOUNTER — Ambulatory Visit: Payer: Medicare HMO | Admitting: Cardiology

## 2020-02-06 VITALS — BP 120/76 | HR 91 | Ht 64.0 in | Wt 153.6 lb

## 2020-02-06 DIAGNOSIS — R600 Localized edema: Secondary | ICD-10-CM

## 2020-02-06 DIAGNOSIS — R9431 Abnormal electrocardiogram [ECG] [EKG]: Secondary | ICD-10-CM | POA: Diagnosis not present

## 2020-02-06 DIAGNOSIS — R002 Palpitations: Secondary | ICD-10-CM

## 2020-02-06 NOTE — Patient Instructions (Signed)

## 2020-02-06 NOTE — Addendum Note (Signed)
Addended by: Julian Hy T on: 02/06/2020 12:52 PM   Modules accepted: Orders

## 2020-02-06 NOTE — Progress Notes (Signed)
Clinical Summary Ms. Anwar is a 74 y.o.female seen today as a new consult, referred by Dr Nevada Crane for leg edema and palpitations.   1. LE edema - comes and goes, ongoing x 10 years.  - seems to be worst  09/2016 echo LVEF 60-65%, no WMAs, grade I DDx - pcp had started torsemide, seems to be controlled with torsemide.   11/2019 labs Na 147, K 2.9, Cr 0.84, TSH 0.5, BNP 76 - has compression stockings.  - swelling is stable she reports    2. Palpitations - only occurs for a few minutes after activity, not at rest - feeling of heart pounding - lasts just a few minutes, then resolves - started about 60month ago - symptoms occur once every 3 weeks, overall infrequent and short in duration    Past Medical History:  Diagnosis Date  . Depression   . GERD (gastroesophageal reflux disease)   . History of colon polyps    remote past, unable to retrieve records   . Hyperlipidemia   . Hypothyroidism   . Rheumatoid arthritis (HCC)      Allergies  Allergen Reactions  . Desyrel [Trazodone]     unknown  . Keflex [Cephalexin]     unknown  . Klonopin [Clonazepam]     unknown  . Minocin [Minocycline Hcl]     unknown  . Stelazine [Trifluoperazine]     Mouth was crossing over   . Viberzi [Eluxadoline]     abd pain, dries out eyes,      Current Outpatient Medications  Medication Sig Dispense Refill  . albuterol (VENTOLIN HFA) 108 (90 Base) MCG/ACT inhaler Inhale 2 puffs into the lungs every 6 (six) hours as needed for wheezing or shortness of breath.    [name] ELLIPTA 62.5-25 MCG/INH AEPB Inhale 1 puff into the lungs daily.    . budesonide (ENTOCORT EC) 3 MG 24 hr capsule Take 2 capsules (6 mg total) by mouth daily. (Patient taking differently: Take 9 mg by mouth daily. ) 90 capsule 0  . Calcium Carb-Cholecalciferol (CALCIUM 600 + D PO) Take 2 tablets by mouth daily.    Marland Kitchen Dextromethorphan-guaiFENesin (MUCINEX DM MAXIMUM STRENGTH) 60-1200 MG TB12 Take 1 tablet by mouth as needed.      . dicyclomine (BENTYL) 10 MG capsule Take 1 capsule (10 mg total) by mouth 4 (four) times daily as needed for spasms. (Patient not taking: Reported on 12/12/2019) 90 capsule 3  . fluticasone (FLONASE) 50 MCG/ACT nasal spray Place 2 sprays into both nostrils 2 (two) times daily as needed for allergies.   1  . levothyroxine (SYNTHROID, LEVOTHROID) 100 MCG tablet Take 100 mcg by mouth daily before breakfast.    . methocarbamol (ROBAXIN) 500 MG tablet Take 500 mg by mouth as needed for muscle spasms.     . mirabegron ER (MYRBETRIQ) 25 MG TB24 tablet Take 25 mg by mouth at bedtime.    . pantoprazole (PROTONIX) 40 MG tablet TAKE ONE TABLET BY MOUTH ONCE DAILY BEFORE BREAKFAST. (Patient taking differently: Take 40 mg by mouth daily. ) 90 tablet 3  . simvastatin (ZOCOR) 40 MG tablet Take 40 mg by mouth every evening.     No current facility-administered medications for this visit.     Past Surgical History:  Procedure Laterality Date  . COLONOSCOPY     last one was about 3 yrs in [place]  . COLONOSCOPY N/A 02/19/2016   Dr. Gala Romney: three 3-6 mm polyps (tubular adenomas) in ascending colon,  segmental biopsies consistent with lymphocytic colitis. Prescribed entocort.   . COLONOSCOPY N/A 03/06/2019   Dr. Gala Romney: 2 polyps removed, tubular adenomas, diverticulosis.  NO random colon biopsies performed.  Next colonoscopy 5 years.  . ESOPHAGOGASTRODUODENOSCOPY N/A 08/11/2014   RMR: MIld erosive  reflux esophagitis. small hiatal hernia. Abnormal gastric muocsa uncertain significance. Subtly abnormal duodeanl (bulbar) mucosa- status post multiple biopsies. BENIGN small bowel mucosa, no evidence of villous blulnting, mild reactive gastropathy on stomach biopsy   . GALLBLADDER SURGERY    . LEG SURGERY     right  . PARTIAL HYSTERECTOMY       Allergies  Allergen Reactions  . Desyrel [Trazodone]     unknown  . Keflex [Cephalexin]     unknown  . Klonopin [Clonazepam]     unknown  . Minocin [Minocycline  Hcl]     unknown  . Stelazine [Trifluoperazine]     Mouth was crossing over   . Viberzi [Eluxadoline]     abd pain, dries out eyes,       Family History  Problem Relation Age of Onset  . Colon cancer Neg Hx      Social History Ms. Forse reports that she has been smoking cigarettes. She has been smoking about 0.25 packs per day. She has never used smokeless tobacco. Ms. Gamboa reports no history of alcohol use.   Review of Systems CONSTITUTIONAL: No weight loss, fever, chills, weakness or fatigue.  HEENT: Eyes: No visual loss, blurred vision, double vision or yellow sclerae.No hearing loss, sneezing, congestion, runny nose or sore throat.  SKIN: No rash or itching.  CARDIOVASCULAR: per hpi RESPIRATORY: No shortness of breath, cough or sputum.  GASTROINTESTINAL: No anorexia, nausea, vomiting or diarrhea. No abdominal pain or blood.  GENITOURINARY: No burning on urination, no polyuria NEUROLOGICAL: No headache, dizziness, syncope, paralysis, ataxia, numbness or tingling in the extremities. No change in bowel or bladder control.  MUSCULOSKELETAL: No muscle, back pain, joint pain or stiffness.  LYMPHATICS: No enlarged nodes. No history of splenectomy.  PSYCHIATRIC: No history of depression or anxiety.  ENDOCRINOLOGIC: No reports of sweating, cold or heat intolerance. No polyuria or polydipsia.  Marland Kitchen   Physical Examination Today's Vitals   02/06/20 1033  BP: 120/76  Pulse: 91  SpO2: 96%  Weight: 153 lb 9.6 oz (69.7 kg)  Height: 5\' 4"  (1.626 m)   Body mass index is 26.37 kg/m.  Gen: resting comfortably, no acute distress HEENT: no scleral icterus, pupils equal round and reactive, no palptable cervical adenopathy,  CV: RRR, no m/r/g, no jvd Resp: Clear to auscultation bilaterally GI: abdomen is soft, non-tender, non-distended, normal bowel sounds, no hepatosplenomegaly MSK: extremities are warm, no edema.  Skin: warm, no rash Neuro:  no focal deficits Psych: appropriate  affect     Assessment and Plan  1. Leg edema - several year history of intermittent edema, she reports is unchanged and well controlled with torsemide - echo 2018 with just mild diastolic dysfunction. In absence of significant change in swelling would not repeat study - would think likely combination of mild diastolic dysfunction and probable venous insufficiency. Had a recent normal BNP arguing against significant HF - continue torsemide, im ok just taking as needed. Has compression stockings, can wear on days where swelling is increased  2. Palpitations - fairly infrequent symptoms, only lasting a minute or so after exertion. No resting symptoms - infrequent exertional symptoms, if was a significant arrhythmia likely would have symptoms at rest and more frequent -  monitor at this time, if progression would plan for event monitor - EKG today shows NSR  3. Abnormal EKG EKG SR, LAFB. Looks like anteroseptal [place] but echo shows normal wall motion without wall motion abnormality - no further workup   F/u 6 months   Arnoldo Lenis, M.D.

## 2020-02-12 DIAGNOSIS — H43813 Vitreous degeneration, bilateral: Secondary | ICD-10-CM | POA: Diagnosis not present

## 2020-02-13 ENCOUNTER — Encounter: Payer: Self-pay | Admitting: Gastroenterology

## 2020-02-13 NOTE — Progress Notes (Signed)
Referring Provider: Celene Squibb, MD Primary Care Physician:  Celene Squibb, MD Primary GI Physician: Dr. Gala Romney  Chief Complaint  Patient presents with  . Diarrhea    HPI:   Jennifer [name] is a 74 y.o. female with history of GERD and lymphocytic colitis diagnosed in 2017. Historically, she has been on Entocort intermittently for flares but has not been able to taper off Entocort since October 2020.Last colonoscopy on 03/06/2019 with 2 tubular adenomas and diverticulosis. Recommendations to repeat in 5 years. Also with acute uncomplicated diverticulitis in mid sigmoid colon on 07/22/19 s/p treatment with cipro and flagyl x 10 days. She presents today for follow-up.  She was last seen via telemedicine visit 12/12/2019. At her prior visit in March 2021, patient has been doing well with 1 bowel movement daily on Entocort 6 mg, Bentyl once daily, 1 Tums before meals, and following a lactose-free diet. We had discussed possible side effects of Bentyl and elderly patients with possibility of trying cholestyramine. Patient was interested in trying cholestyramine and was started on 4 mg daily with breakfast. Entocort 6 mg daily was continued with a goal of decreasing to 3 mg daily in the next 2 to 4 weeks. She had called our office in April 2021 reporting she had been doing well on Entocort 3 mg daily and DC'd 7 days ago and could no longer controlled bowel movements. At the time of her office visit in May 2021, she reported taking 3 Entocort daily, no Questran, no Bentyl, and 1 Tums before meals. She is having 4 BMs daily with associated urgency and sometimes inability to control BMs. Noted diarrhea had worsened when her husband went to the hospital 2 weeks prior. Felt Bentyl worked much better with Entocort anything else had. Felt Questran was not helpful. Had resumed Entocort 9 mg for about 3 weeks with no significant improvement. Reported being on amoxicillin recently. Plans to check stool studies, continue  Entocort 9 mg daily for now, follow low-fat diet, lactose-free diet, avoid items that cause gas and bloating, avoid NSAIDs, request labs from PCP, and follow-up in 2 months.  Received and reviewed labs completed with PCP 12/10/2019. WBCs elevated at 16.3, hemoglobin elevated at 17.3, platelets normal at 241.  Sodium high 147, potassium low 2.9, kidney function and LFTs within normal limits.  TSH on the very low end of normal at 0.5, free T4 elevated at 2.3. Per phone note on 5/13, PCP with starting her on antibiotics for an infection (unclear what type). PCP was also replacing potassium. Patient was reminded to complete stool studies. I also advised she add G2 or Pedialyte to help replace electrolyte losses from diarrhea.  Stool studies were not completed.  Today: Taking Entocort 3 capsules daily. Diarrhea improved. 2 BMs daily. This has been for the last 3 weeks. Prior to this, she was having a little more watery stool. Stools are formed to mushy. Still taking 1 tums before meals. Felt Bentyl worked better than anything other than Entocort. Hasn't been able to complete stool studies. No blood in the stool. Had some black stool a couple of weeks ago after eating a lot of chocolate pudding. Stopped after she stopped eating pudding. No pepto or iron. No abdominal pain. No NSAIDs. No dairy. Following low fat diet.   Feels foods are getting hung in her esophagus. Meats are the worst. Occasional pain with swallowing. This has been going on for about 1.5 months. Started when she noticed white patches on  her tongue. States it comes and goes. PCP prescribed something for this but she doesn't know what it was. Took it for 2 days but it dried her out. Then she started nystatin swish and spit for 3 weeks. Now she is using it every now and then. The place on her tongue never cleared up completely. Patches will bleed when she brushes her teeth.   No GERD symptoms on Protonix daily. No N/V.   No fever, chills. No cold  or flu like symptoms.     Past Medical History:  Diagnosis Date  . Depression   . GERD (gastroesophageal reflux disease)   . History of colon polyps    remote past, unable to retrieve records   . Hyperlipidemia   . Hypothyroidism   . Lymphocytic colitis   . Rheumatoid arthritis [facility])     Past Surgical History:  Procedure Laterality Date  . COLONOSCOPY     last one was about 3 yrs in [place]  . COLONOSCOPY N/A 02/19/2016   Dr. Gala Romney: three 3-6 mm polyps (tubular adenomas) in ascending colon, segmental biopsies consistent with lymphocytic colitis. Prescribed entocort.   . COLONOSCOPY N/A 03/06/2019   Dr. Gala Romney: 2 polyps removed, tubular adenomas, diverticulosis.  NO random colon biopsies performed.  Next colonoscopy 5 years.  . ESOPHAGOGASTRODUODENOSCOPY N/A 08/11/2014   RMR: MIld erosive  reflux esophagitis. small hiatal hernia. Abnormal gastric muocsa uncertain significance. Subtly abnormal duodeanl (bulbar) mucosa- status post multiple biopsies. BENIGN small bowel mucosa, no evidence of villous blulnting, mild reactive gastropathy on stomach biopsy   . GALLBLADDER SURGERY    . LEG SURGERY     right  . PARTIAL HYSTERECTOMY      Current Outpatient Medications  Medication Sig Dispense Refill  . albuterol (VENTOLIN HFA) 108 (90 Base) MCG/ACT inhaler Inhale 2 puffs into the lungs every 6 (six) hours as needed for wheezing or shortness of breath.    [name] ELLIPTA 62.5-25 MCG/INH AEPB Inhale 1 puff into the lungs daily.    . budesonide (ENTOCORT EC) 3 MG 24 hr capsule Take 9 mg by mouth daily.    . Calcium Carb-Cholecalciferol (CALCIUM 600 + D PO) Take 2 tablets by mouth daily.    Marland Kitchen Dextromethorphan-guaiFENesin (MUCINEX DM MAXIMUM STRENGTH) 60-1200 MG TB12 Take 1 tablet by mouth as needed.     . fluticasone (FLONASE) 50 MCG/ACT nasal spray Place 2 sprays into both nostrils 2 (two) times daily as needed for allergies.   1  . levothyroxine (SYNTHROID, LEVOTHROID) 100 MCG tablet  Take 100 mcg by mouth daily before breakfast.    . methocarbamol (ROBAXIN) 500 MG tablet Take 500 mg by mouth as needed for muscle spasms.     . mirabegron ER (MYRBETRIQ) 25 MG TB24 tablet Take 25 mg by mouth at bedtime.    . pantoprazole (PROTONIX) 40 MG tablet TAKE ONE TABLET BY MOUTH ONCE DAILY BEFORE BREAKFAST. (Patient taking differently: Take 40 mg by mouth daily. ) 90 tablet 3  . potassium chloride (KLOR-CON) 10 MEQ tablet Take 10 mEq by mouth daily. Takes 20 mg daily.    . simvastatin (ZOCOR) 40 MG tablet Take 40 mg by mouth every evening.    . torsemide (DEMADEX) 20 MG tablet Take 20 mg by mouth daily.    Marland Kitchen dicyclomine (BENTYL) 10 MG capsule Take 1 capsule (10 mg total) by mouth daily before breakfast. 60 capsule 1  . fluconazole (DIFLUCAN) 100 MG tablet Take 1 tablet (100 mg total) by mouth daily. 7  tablet 0   No current facility-administered medications for this visit.    Allergies as of 02/14/2020 - Review Complete 02/14/2020  Allergen Reaction Noted  . Desyrel [trazodone]  07/14/2014  . Keflex [cephalexin]  07/14/2014  . Klonopin [clonazepam]  07/14/2014  . Minocin [minocycline hcl]  07/14/2014  . Stelazine [trifluoperazine]  07/14/2014  . Viberzi [eluxadoline]  03/31/2015    Family History  Problem Relation Age of Onset  . Cancer Sister        blood disorder ??  . Colon cancer Neg Hx     Social History   Socioeconomic History  . Marital status: Married    Spouse name: Not on file  . Number of children: 2  . Years of education: Not on file  . Highest education level: Not on file  Occupational History  . Occupation: disability  Tobacco Use  . Smoking status: Current Every Day Smoker    Packs/day: 0.25    Types: Cigarettes  . Smokeless tobacco: Never Used  . Tobacco comment: quit 03/31/18  Substance and Sexual Activity  . Alcohol use: No    Alcohol/week: 0.0 standard drinks  . Drug use: No  . Sexual activity: Not on file  Other Topics Concern  . Not on  file  Social History Narrative  . Not on file   Social Determinants of Health   Financial Resource Strain:   . Difficulty of Paying Living Expenses:   Food Insecurity:   . Worried About Charity fundraiser in the Last Year:   . Arboriculturist in the Last Year:   Transportation Needs:   . Film/video editor (Medical):   Marland Kitchen Lack of Transportation (Non-Medical):   Physical Activity:   . Days of Exercise per Week:   . Minutes of Exercise per Session:   Stress:   . Feeling of Stress :   Social Connections:   . Frequency of Communication with Friends and Family:   . Frequency of Social Gatherings with Friends and Family:   . Attends Religious Services:   . Active Member of Clubs or Organizations:   . Attends Archivist Meetings:   Marland Kitchen Marital Status:     Review of Systems: Gen: Lightheadedness, dizziness, presyncope, syncope. CV: Denies chest pain or palpitations. Resp: Chronic SOB with exertion. Chronic intermittent cough.  GI: See HPI Heme: See HPI  Physical Exam: BP 126/82   Pulse 89   Temp (!) 97.1 F (36.2 C) (Temporal)   Ht 5\' 4"  (1.626 m)   Wt 152 lb 9.6 oz (69.2 kg)   BMI 26.19 kg/m  General:   Alert and oriented. No distress noted. Pleasant and cooperative.  Head:  Normocephalic and atraumatic. Eyes:  Conjuctiva clear without scleral icterus. Mouth: White on the corners of her mouth, white plaques on the top lateral sides of her tongue, and patches of white on the roof of her mouth. Unable to see posterior pharynx as tongue is in the way.  Heart:  S1, S2 present without murmurs appreciated. Lungs:  Clear to auscultation bilaterally. No wheezes, rales, or rhonchi. No distress.  Abdomen:  +BS, soft, non-tender and non-distended. No rebound or guarding. No HSM or masses noted. Msk:  Symmetrical without gross deformities. Normal posture. Extremities:  Without edema. Neurologic:  Alert and  oriented x4 Psych: Normal mood and affect.

## 2020-02-14 ENCOUNTER — Other Ambulatory Visit: Payer: Self-pay

## 2020-02-14 ENCOUNTER — Encounter: Payer: Self-pay | Admitting: Gastroenterology

## 2020-02-14 ENCOUNTER — Ambulatory Visit: Payer: Medicare HMO | Admitting: Gastroenterology

## 2020-02-14 ENCOUNTER — Encounter: Payer: Self-pay | Admitting: *Deleted

## 2020-02-14 VITALS — BP 126/82 | HR 89 | Temp 97.1°F | Ht 64.0 in | Wt 152.6 lb

## 2020-02-14 DIAGNOSIS — B370 Candidal stomatitis: Secondary | ICD-10-CM

## 2020-02-14 DIAGNOSIS — R1310 Dysphagia, unspecified: Secondary | ICD-10-CM | POA: Insufficient documentation

## 2020-02-14 DIAGNOSIS — K52832 Lymphocytic colitis: Secondary | ICD-10-CM | POA: Diagnosis not present

## 2020-02-14 DIAGNOSIS — R197 Diarrhea, unspecified: Secondary | ICD-10-CM | POA: Diagnosis not present

## 2020-02-14 MED ORDER — DICYCLOMINE HCL 10 MG PO CAPS: 10.0000 mg | ORAL_CAPSULE | Freq: Every day | ORAL | 1 refills | Status: DC

## 2020-02-14 MED ORDER — FLUCONAZOLE 100 MG PO TABS: 100.0000 mg | ORAL_TABLET | Freq: Every day | ORAL | 0 refills | Status: DC

## 2020-02-14 NOTE — Assessment & Plan Note (Signed)
Addressed under diarrhea

## 2020-02-14 NOTE — Assessment & Plan Note (Addendum)
1.5 month history of solid food dysphagia with occasional odynophagia. Also with 1.5 months of what appears to be oropharyngeal candidiasis; can't rule out esophageal candidiasis. GERD symptoms are well controlled on Protonix 40 daily. Notably, she has been on Entocort at various doses since October 2020 for flare pf lymphocytic colitis with inability to taper completely as addressed under diarrhea.   Plan: Proceed with EGD +/- dilation with Dr. Gala Romney in the near future. The risks, benefits, and alternatives have been discussed in detail with patient. They have stated understanding and desire to proceed.  ASA II Treat oropharyngeal candidiasis with diflucan 100 mg x 7 days. Requested progress report in 6 days. Can extend course to 14 days if she is noting improvement but not completely resolved. She is to stop simvastatin while taking Diflucan. Follow-up in 2 months.

## 2020-02-14 NOTE — Assessment & Plan Note (Signed)
Lymphocytic colitis diagnosed in 2017. Intermittently, she has been on Entocort for flares of diarrhea.  More recently, she was started on Entocort 9 mg in October 2020 and has been unable to taper off.  She had reduced to Entocort 3 mg daily and seemed to be doing fairly well but had a flare of diarrhea starting in April.  PCP increased Entocort 9 mg. At her last visit, she continued with 4 loose to watery BMs daily that were uncontrollable at times due to urgency. Noted diarrhea worsened around the time her husband went in the hospital. She was continued on Entocort 9 mg and advised to complete stool studies but these were never completed. Today, she notes improvement in diarrhea with 2 formed-mushy stools daily. Denies abdominal pain, BRBPR, or melena. No NSAIDs. TSH normal but on the low end in May 2021 with PCP. Last colonoscopy August 2020 and due for repeat in 2025. Reports the only thing that has helped her diarrhea aside from Entocort was Bentyl daily. Tried and failed Questran.   Diarrhea likely miltifactorial with lymphocytic colitis, possible IBS with increased BMs related to stress, possible bile salt diarrhea with history of cholecystectomy and possible influence from borderline thyroid abnormalities. Do not think we are dealing with infectious diarrhea as she has had improvement with Entocort. We need to get her off of Bentyl if possible as she now has what looks like oropharyngeal candidiasis as discussed below.   Plan:  Continue Bentyl 9 mg for 2 weeks, then decrease to 6 mg.  Start Bentyl 10 mg daily as patient felt this worked the best in the past. Cautioned on possibility of dry mouth, dizziness, blurry vision, and urinary retention and advised she stop medication and let us know if this occurs immediately. Continue 1 tums before meals.  Continue low-fat diet.  Requested progress report in 4 weeks. If she continues to do well with Bentyl 6 mg, may try to decrease to 3 mg daily after an  additional 4 weeks. Could consider trying Bismuth subsalicylate although data is limited.   Follow-up in 2 months.

## 2020-02-14 NOTE — Patient Instructions (Addendum)
Stop nystatin and start Diflucan.  I am sending in Diflucan 100 mg to your pharmacy.  Take this for 7 days.  Please call me on day 6 with a progress report.  If symptoms are improving but have not resolved, we can extend this to 14 days. Stop simvastatin while taking Diflucan.  We will get you scheduled for an upper endoscopy with possible dilation of your esophagus in the near future with Dr. Buford Dresser.  Continue taking Entocort 9 mg daily for the next 2 weeks, then decrease to 6 mg daily.  I am sending Bentyl 10 mg to your pharmacy.  Take this once daily.  Continue taking 1 Tums with meals.  Continue following a low-fat diet.  Please call with a progress report in 4 weeks regarding your diarrhea.  We will plan to see you back in the office in 2 months.  Aliene Altes, PA-C [facility]

## 2020-02-14 NOTE — Assessment & Plan Note (Addendum)
Patient reports 1.5 months of white patches in her mouth that brush off somewhat and bleed when brushing her teeth. PCP treated her with nyatatin with instruction to swish and swallow. After 3 weeks, she had minimal improvement. Now only using nystatin intermittently. On exam, she has white on the corners of her mouth, white plaques on the side of her tongue, and white patches in the roof of her mouth. Also reports 1.5 month history of dysphagia to solid food and occasional odynophagia. Notably, she has been on Entocort at various doses since October 2020 for flare pf lymphocytic colitis with inability to taper completely as addressed under diarrhea.   Suspect oropharyngeal candidiasis. Can't rule out esophageal candidiasis.   Advised she stop nystatin and start diflucan 100 mg x 7 days. Requested progress report in 6 days. Can extend course to 14 days if she is noting improvement but not completely resolved.  She is to stop simvastatin while taking Diflucan. We will also proceed with EGD +/- dilation with Dr. Gala Romney in the near future. The risks, benefits, and alternatives have been discussed in detail with patient. They have stated understanding and desire to proceed.   ASA II.   Follow-up in 2 months for diarrhea.

## 2020-02-20 DIAGNOSIS — K52832 Lymphocytic colitis: Secondary | ICD-10-CM | POA: Diagnosis not present

## 2020-02-20 DIAGNOSIS — L299 Pruritus, unspecified: Secondary | ICD-10-CM | POA: Diagnosis not present

## 2020-02-20 DIAGNOSIS — B370 Candidal stomatitis: Secondary | ICD-10-CM | POA: Diagnosis not present

## 2020-03-05 ENCOUNTER — Other Ambulatory Visit (HOSPITAL_COMMUNITY)
Admission: RE | Admit: 2020-03-05 | Discharge: 2020-03-05 | Disposition: A | Discharge status: Home / Self Care | Payer: Medicare HMO | Source: Ambulatory Visit | Attending: Internal Medicine | Admitting: Internal Medicine

## 2020-03-05 ENCOUNTER — Telehealth: Payer: Self-pay | Admitting: *Deleted

## 2020-03-05 NOTE — Telephone Encounter (Addendum)
Received call from melanie that they have tried calling pt multiple times unable to reach her. Patient has been taken off schedule for procedure tomorrow. FYI to [employer]

## 2020-03-05 NOTE — Progress Notes (Signed)
I left a message with patient's voicemail.Made patient aware her procedure is tomorrow, she will need to come somtime today before 4:00. I also asked her to call me back to give me an idea of when she could come in. Need to make registration aware. Waiting. I called patient back, since I haven't heard from her. I left another message. This time I asked her to call her Dr's office and have them reschedule her procedure if she can't make it in today by 4:00. Patient didn't show up.

## 2020-03-05 NOTE — Telephone Encounter (Signed)
[name] called from [facility] to let us know that pt did not show for Covid test.  She said that they tried to call her this morning but didn't get her.  She has procedure tomorrow.

## 2020-03-05 NOTE — Telephone Encounter (Signed)
Noted. Can we try reaching out to her to get the procedure rescheduled?

## 2020-03-06 ENCOUNTER — Ambulatory Visit (HOSPITAL_COMMUNITY): Admission: RE | Admit: 2020-03-06 | Payer: Medicare HMO | Source: Home / Self Care | Admitting: Internal Medicine

## 2020-03-06 ENCOUNTER — Encounter (HOSPITAL_COMMUNITY): Admission: RE | Payer: Self-pay | Source: Home / Self Care

## 2020-03-06 SURGERY — EGD (ESOPHAGOGASTRODUODENOSCOPY)
Anesthesia: Moderate Sedation

## 2020-03-06 NOTE — Telephone Encounter (Signed)
CALLED PT, NO ANSWER.  Susan/stacey please schedule OV and mail letter to get rescheduled

## 2020-03-11 NOTE — Telephone Encounter (Signed)
Patient scheduled.

## 2020-03-26 DIAGNOSIS — R69 Illness, unspecified: Secondary | ICD-10-CM | POA: Diagnosis not present

## 2020-03-26 DIAGNOSIS — G5620 Lesion of ulnar nerve, unspecified upper limb: Secondary | ICD-10-CM | POA: Diagnosis not present

## 2020-03-26 DIAGNOSIS — L299 Pruritus, unspecified: Secondary | ICD-10-CM | POA: Diagnosis not present

## 2020-03-30 DIAGNOSIS — K219 Gastro-esophageal reflux disease without esophagitis: Secondary | ICD-10-CM | POA: Diagnosis not present

## 2020-03-30 DIAGNOSIS — I10 Essential (primary) hypertension: Secondary | ICD-10-CM | POA: Diagnosis not present

## 2020-03-30 DIAGNOSIS — E559 Vitamin D deficiency, unspecified: Secondary | ICD-10-CM | POA: Diagnosis not present

## 2020-04-03 ENCOUNTER — Other Ambulatory Visit (HOSPITAL_COMMUNITY): Payer: Self-pay | Admitting: Internal Medicine

## 2020-04-03 DIAGNOSIS — Z1231 Encounter for screening mammogram for malignant neoplasm of breast: Secondary | ICD-10-CM

## 2020-04-03 DIAGNOSIS — Z13820 Encounter for screening for osteoporosis: Secondary | ICD-10-CM

## 2020-04-10 ENCOUNTER — Other Ambulatory Visit: Payer: Self-pay

## 2020-04-10 ENCOUNTER — Ambulatory Visit (HOSPITAL_COMMUNITY)
Admission: RE | Admit: 2020-04-10 | Discharge: 2020-04-10 | Disposition: A | Discharge status: Home / Self Care | Payer: Medicare HMO | Source: Ambulatory Visit | Attending: Internal Medicine | Admitting: Internal Medicine

## 2020-04-10 DIAGNOSIS — Z1231 Encounter for screening mammogram for malignant neoplasm of breast: Secondary | ICD-10-CM | POA: Diagnosis not present

## 2020-04-14 ENCOUNTER — Other Ambulatory Visit: Payer: Self-pay

## 2020-04-14 ENCOUNTER — Ambulatory Visit (HOSPITAL_COMMUNITY)
Admission: RE | Admit: 2020-04-14 | Discharge: 2020-04-14 | Disposition: A | Discharge status: Home / Self Care | Payer: Medicare HMO | Source: Ambulatory Visit | Attending: Internal Medicine | Admitting: Internal Medicine

## 2020-04-14 ENCOUNTER — Other Ambulatory Visit (HOSPITAL_COMMUNITY): Payer: Self-pay | Admitting: Internal Medicine

## 2020-04-14 DIAGNOSIS — Z13820 Encounter for screening for osteoporosis: Secondary | ICD-10-CM | POA: Diagnosis not present

## 2020-04-14 DIAGNOSIS — M810 Age-related osteoporosis without current pathological fracture: Secondary | ICD-10-CM | POA: Insufficient documentation

## 2020-04-14 DIAGNOSIS — Z780 Asymptomatic menopausal state: Secondary | ICD-10-CM | POA: Diagnosis not present

## 2020-04-14 DIAGNOSIS — R928 Other abnormal and inconclusive findings on diagnostic imaging of breast: Secondary | ICD-10-CM

## 2020-04-20 ENCOUNTER — Ambulatory Visit: Payer: Medicare HMO | Admitting: Gastroenterology

## 2020-04-21 ENCOUNTER — Ambulatory Visit (HOSPITAL_COMMUNITY)
Admission: RE | Admit: 2020-04-21 | Discharge: 2020-04-21 | Disposition: A | Discharge status: Home / Self Care | Payer: Medicare HMO | Source: Ambulatory Visit | Attending: Internal Medicine | Admitting: Internal Medicine

## 2020-04-21 ENCOUNTER — Other Ambulatory Visit: Payer: Self-pay

## 2020-04-21 DIAGNOSIS — R928 Other abnormal and inconclusive findings on diagnostic imaging of breast: Secondary | ICD-10-CM

## 2020-05-07 DIAGNOSIS — M549 Dorsalgia, unspecified: Secondary | ICD-10-CM | POA: Diagnosis not present

## 2020-05-07 DIAGNOSIS — G5620 Lesion of ulnar nerve, unspecified upper limb: Secondary | ICD-10-CM | POA: Diagnosis not present

## 2020-05-07 DIAGNOSIS — L299 Pruritus, unspecified: Secondary | ICD-10-CM | POA: Diagnosis not present

## 2020-05-22 DIAGNOSIS — R7303 Prediabetes: Secondary | ICD-10-CM | POA: Diagnosis not present

## 2020-05-22 DIAGNOSIS — Z23 Encounter for immunization: Secondary | ICD-10-CM | POA: Diagnosis not present

## 2020-05-22 DIAGNOSIS — J305 Allergic rhinitis due to food: Secondary | ICD-10-CM | POA: Diagnosis not present

## 2020-05-22 DIAGNOSIS — E782 Mixed hyperlipidemia: Secondary | ICD-10-CM | POA: Diagnosis not present

## 2020-05-22 DIAGNOSIS — L299 Pruritus, unspecified: Secondary | ICD-10-CM | POA: Diagnosis not present

## 2020-05-22 DIAGNOSIS — E559 Vitamin D deficiency, unspecified: Secondary | ICD-10-CM | POA: Diagnosis not present

## 2020-05-22 DIAGNOSIS — Z0001 Encounter for general adult medical examination with abnormal findings: Secondary | ICD-10-CM | POA: Diagnosis not present

## 2020-05-22 DIAGNOSIS — G5620 Lesion of ulnar nerve, unspecified upper limb: Secondary | ICD-10-CM | POA: Diagnosis not present

## 2020-05-22 DIAGNOSIS — I10 Essential (primary) hypertension: Secondary | ICD-10-CM | POA: Diagnosis not present

## 2020-05-22 DIAGNOSIS — L03314 Cellulitis of groin: Secondary | ICD-10-CM | POA: Diagnosis not present

## 2020-05-22 DIAGNOSIS — R69 Illness, unspecified: Secondary | ICD-10-CM | POA: Diagnosis not present

## 2020-05-22 DIAGNOSIS — R1084 Generalized abdominal pain: Secondary | ICD-10-CM | POA: Diagnosis not present

## 2020-05-22 DIAGNOSIS — K6289 Other specified diseases of anus and rectum: Secondary | ICD-10-CM | POA: Diagnosis not present

## 2020-05-25 DIAGNOSIS — K52832 Lymphocytic colitis: Secondary | ICD-10-CM | POA: Diagnosis not present

## 2020-05-25 DIAGNOSIS — R600 Localized edema: Secondary | ICD-10-CM | POA: Diagnosis not present

## 2020-05-25 DIAGNOSIS — Z23 Encounter for immunization: Secondary | ICD-10-CM | POA: Diagnosis not present

## 2020-05-25 DIAGNOSIS — E782 Mixed hyperlipidemia: Secondary | ICD-10-CM | POA: Diagnosis not present

## 2020-05-25 DIAGNOSIS — E46 Unspecified protein-calorie malnutrition: Secondary | ICD-10-CM | POA: Diagnosis not present

## 2020-05-25 DIAGNOSIS — E039 Hypothyroidism, unspecified: Secondary | ICD-10-CM | POA: Diagnosis not present

## 2020-05-25 DIAGNOSIS — R7301 Impaired fasting glucose: Secondary | ICD-10-CM | POA: Diagnosis not present

## 2020-05-25 DIAGNOSIS — R945 Abnormal results of liver function studies: Secondary | ICD-10-CM | POA: Diagnosis not present

## 2020-05-25 DIAGNOSIS — J449 Chronic obstructive pulmonary disease, unspecified: Secondary | ICD-10-CM | POA: Diagnosis not present

## 2020-05-25 DIAGNOSIS — J019 Acute sinusitis, unspecified: Secondary | ICD-10-CM | POA: Diagnosis not present

## 2020-06-16 DIAGNOSIS — R7301 Impaired fasting glucose: Secondary | ICD-10-CM | POA: Diagnosis not present

## 2020-06-16 DIAGNOSIS — M810 Age-related osteoporosis without current pathological fracture: Secondary | ICD-10-CM | POA: Diagnosis not present

## 2020-06-16 DIAGNOSIS — E782 Mixed hyperlipidemia: Secondary | ICD-10-CM | POA: Diagnosis not present

## 2020-06-16 DIAGNOSIS — E039 Hypothyroidism, unspecified: Secondary | ICD-10-CM | POA: Diagnosis not present

## 2020-06-16 DIAGNOSIS — R600 Localized edema: Secondary | ICD-10-CM | POA: Diagnosis not present

## 2020-06-16 DIAGNOSIS — E46 Unspecified protein-calorie malnutrition: Secondary | ICD-10-CM | POA: Diagnosis not present

## 2020-06-16 DIAGNOSIS — R945 Abnormal results of liver function studies: Secondary | ICD-10-CM | POA: Diagnosis not present

## 2020-06-16 DIAGNOSIS — J449 Chronic obstructive pulmonary disease, unspecified: Secondary | ICD-10-CM | POA: Diagnosis not present

## 2020-06-16 DIAGNOSIS — K52832 Lymphocytic colitis: Secondary | ICD-10-CM | POA: Diagnosis not present

## 2020-06-16 DIAGNOSIS — Z23 Encounter for immunization: Secondary | ICD-10-CM | POA: Diagnosis not present

## 2020-06-24 DIAGNOSIS — E559 Vitamin D deficiency, unspecified: Secondary | ICD-10-CM | POA: Diagnosis not present

## 2020-06-24 DIAGNOSIS — E782 Mixed hyperlipidemia: Secondary | ICD-10-CM | POA: Diagnosis not present

## 2020-06-24 DIAGNOSIS — J019 Acute sinusitis, unspecified: Secondary | ICD-10-CM | POA: Diagnosis not present

## 2020-06-24 DIAGNOSIS — K6289 Other specified diseases of anus and rectum: Secondary | ICD-10-CM | POA: Diagnosis not present

## 2020-06-24 DIAGNOSIS — G5620 Lesion of ulnar nerve, unspecified upper limb: Secondary | ICD-10-CM | POA: Diagnosis not present

## 2020-06-24 DIAGNOSIS — R109 Unspecified abdominal pain: Secondary | ICD-10-CM | POA: Diagnosis not present

## 2020-06-24 DIAGNOSIS — J305 Allergic rhinitis due to food: Secondary | ICD-10-CM | POA: Diagnosis not present

## 2020-06-24 DIAGNOSIS — R945 Abnormal results of liver function studies: Secondary | ICD-10-CM | POA: Diagnosis not present

## 2020-06-24 DIAGNOSIS — E46 Unspecified protein-calorie malnutrition: Secondary | ICD-10-CM | POA: Diagnosis not present

## 2020-06-24 DIAGNOSIS — R7301 Impaired fasting glucose: Secondary | ICD-10-CM | POA: Diagnosis not present

## 2020-06-24 DIAGNOSIS — R69 Illness, unspecified: Secondary | ICD-10-CM | POA: Diagnosis not present

## 2020-06-24 DIAGNOSIS — J449 Chronic obstructive pulmonary disease, unspecified: Secondary | ICD-10-CM | POA: Diagnosis not present

## 2020-06-24 DIAGNOSIS — L299 Pruritus, unspecified: Secondary | ICD-10-CM | POA: Diagnosis not present

## 2020-06-24 DIAGNOSIS — Z0001 Encounter for general adult medical examination with abnormal findings: Secondary | ICD-10-CM | POA: Diagnosis not present

## 2020-06-24 DIAGNOSIS — L03314 Cellulitis of groin: Secondary | ICD-10-CM | POA: Diagnosis not present

## 2020-06-24 DIAGNOSIS — K52832 Lymphocytic colitis: Secondary | ICD-10-CM | POA: Diagnosis not present

## 2020-06-24 DIAGNOSIS — R600 Localized edema: Secondary | ICD-10-CM | POA: Diagnosis not present

## 2020-06-24 DIAGNOSIS — E039 Hypothyroidism, unspecified: Secondary | ICD-10-CM | POA: Diagnosis not present

## 2020-06-24 DIAGNOSIS — R1084 Generalized abdominal pain: Secondary | ICD-10-CM | POA: Diagnosis not present

## 2020-06-24 DIAGNOSIS — Z23 Encounter for immunization: Secondary | ICD-10-CM | POA: Diagnosis not present

## 2020-07-29 DIAGNOSIS — R109 Unspecified abdominal pain: Secondary | ICD-10-CM | POA: Diagnosis not present

## 2020-07-29 DIAGNOSIS — L299 Pruritus, unspecified: Secondary | ICD-10-CM | POA: Diagnosis not present

## 2020-07-29 DIAGNOSIS — B379 Candidiasis, unspecified: Secondary | ICD-10-CM | POA: Diagnosis not present

## 2020-07-29 DIAGNOSIS — R070 Pain in throat: Secondary | ICD-10-CM | POA: Diagnosis not present

## 2020-07-29 DIAGNOSIS — R69 Illness, unspecified: Secondary | ICD-10-CM | POA: Diagnosis not present

## 2020-07-29 DIAGNOSIS — R002 Palpitations: Secondary | ICD-10-CM | POA: Diagnosis not present

## 2020-07-29 DIAGNOSIS — Z1152 Encounter for screening for COVID-19: Secondary | ICD-10-CM | POA: Diagnosis not present

## 2020-07-29 DIAGNOSIS — J329 Chronic sinusitis, unspecified: Secondary | ICD-10-CM | POA: Diagnosis not present

## 2020-07-29 DIAGNOSIS — B370 Candidal stomatitis: Secondary | ICD-10-CM | POA: Diagnosis not present

## 2020-07-29 DIAGNOSIS — R350 Frequency of micturition: Secondary | ICD-10-CM | POA: Diagnosis not present

## 2020-07-29 DIAGNOSIS — L03314 Cellulitis of groin: Secondary | ICD-10-CM | POA: Diagnosis not present

## 2020-07-29 DIAGNOSIS — R21 Rash and other nonspecific skin eruption: Secondary | ICD-10-CM | POA: Diagnosis not present

## 2020-09-28 DIAGNOSIS — I10 Essential (primary) hypertension: Secondary | ICD-10-CM | POA: Diagnosis not present

## 2020-09-28 DIAGNOSIS — R69 Illness, unspecified: Secondary | ICD-10-CM | POA: Diagnosis not present

## 2020-10-01 DIAGNOSIS — J305 Allergic rhinitis due to food: Secondary | ICD-10-CM | POA: Diagnosis not present

## 2020-10-01 DIAGNOSIS — Z23 Encounter for immunization: Secondary | ICD-10-CM | POA: Diagnosis not present

## 2020-10-01 DIAGNOSIS — R1084 Generalized abdominal pain: Secondary | ICD-10-CM | POA: Diagnosis not present

## 2020-10-01 DIAGNOSIS — R69 Illness, unspecified: Secondary | ICD-10-CM | POA: Diagnosis not present

## 2020-10-01 DIAGNOSIS — L03314 Cellulitis of groin: Secondary | ICD-10-CM | POA: Diagnosis not present

## 2020-10-01 DIAGNOSIS — Z0001 Encounter for general adult medical examination with abnormal findings: Secondary | ICD-10-CM | POA: Diagnosis not present

## 2020-10-01 DIAGNOSIS — G5620 Lesion of ulnar nerve, unspecified upper limb: Secondary | ICD-10-CM | POA: Diagnosis not present

## 2020-10-01 DIAGNOSIS — L299 Pruritus, unspecified: Secondary | ICD-10-CM | POA: Diagnosis not present

## 2020-10-01 DIAGNOSIS — E559 Vitamin D deficiency, unspecified: Secondary | ICD-10-CM | POA: Diagnosis not present

## 2020-10-01 DIAGNOSIS — K6289 Other specified diseases of anus and rectum: Secondary | ICD-10-CM | POA: Diagnosis not present

## 2020-10-01 DIAGNOSIS — I10 Essential (primary) hypertension: Secondary | ICD-10-CM | POA: Diagnosis not present

## 2020-10-06 DIAGNOSIS — E039 Hypothyroidism, unspecified: Secondary | ICD-10-CM | POA: Diagnosis not present

## 2020-10-06 DIAGNOSIS — R600 Localized edema: Secondary | ICD-10-CM | POA: Diagnosis not present

## 2020-10-06 DIAGNOSIS — J019 Acute sinusitis, unspecified: Secondary | ICD-10-CM | POA: Diagnosis not present

## 2020-10-06 DIAGNOSIS — K52832 Lymphocytic colitis: Secondary | ICD-10-CM | POA: Diagnosis not present

## 2020-10-06 DIAGNOSIS — E46 Unspecified protein-calorie malnutrition: Secondary | ICD-10-CM | POA: Diagnosis not present

## 2020-10-06 DIAGNOSIS — E782 Mixed hyperlipidemia: Secondary | ICD-10-CM | POA: Diagnosis not present

## 2020-10-06 DIAGNOSIS — R109 Unspecified abdominal pain: Secondary | ICD-10-CM | POA: Diagnosis not present

## 2020-10-06 DIAGNOSIS — J449 Chronic obstructive pulmonary disease, unspecified: Secondary | ICD-10-CM | POA: Diagnosis not present

## 2020-10-06 DIAGNOSIS — R7301 Impaired fasting glucose: Secondary | ICD-10-CM | POA: Diagnosis not present

## 2020-10-06 DIAGNOSIS — R945 Abnormal results of liver function studies: Secondary | ICD-10-CM | POA: Diagnosis not present

## 2020-10-08 ENCOUNTER — Other Ambulatory Visit: Payer: Self-pay | Admitting: Internal Medicine

## 2020-10-08 ENCOUNTER — Other Ambulatory Visit (HOSPITAL_COMMUNITY): Payer: Self-pay | Admitting: Internal Medicine

## 2020-10-08 DIAGNOSIS — M545 Low back pain, unspecified: Secondary | ICD-10-CM

## 2020-10-08 NOTE — Progress Notes (Signed)
Referring Provider: Celene Squibb, MD Primary Care Physician:  Celene Squibb, MD Primary GI Physician: Dr. Gala Romney  Chief Complaint  Patient presents with   Diarrhea    HPI:   [name] is a 75 y.o. female with history of GERD and lymphocytic colitis diagnosed in 2017. Historically, she has been on Entocort intermittently for flares but has not been able to taper off Entocort since October 2020.Last colonoscopy on 03/06/2019 with 2 tubular adenomas and diverticulosis. Recommendations to repeat in 5 years. Acute uncomplicated diverticulitis in mid sigmoid colon on 07/22/19 s/p treatment with cipro and flagyl x 10 days.   Last seen in our office 02/14/20. She had been going well on Entocort 3 mg daily and briefly stopped Entocort in April but had had a flare of diarrhea soon after and resumed Entocort 9 mg daily. We had ordered stool studies, but these were not completed. TSH was low normal at 0.5. At the time of her last visit, she was taking 6m Entorcort and diarrhea had improved with 2 formed to mushy BMs daily for the last 3 weeks. She was still taking 1 tums before meals, following lactose free and low fat diet. Felt Bentyl previously worked better than anything other than Entocort. Also reported dysphagia and odynophagia x 1.5 months which started when she noticed white patches on her tongue.  She was treated with nystatin swish and spit x3 weeks, and continue to use it every now and then.  Lesions on her tongue had not cleared up completely and would bleed intermittently when brushing her tongue.  Denied GERD symptoms on Protonix daily.  On exam, she had evidence of oropharyngeal candidiasis. Plan to treat with Diflucan 100 mg daily x7 days, requested progress report in 6 days as we could extend the course if needed, proceed with EGD with possible dilation.  Regarding diarrhea, suspected this is multifactorial in setting of lymphocytic colitis, possible IBS in the setting of stress, bile salt  diarrhea with history of cholecystectomy, and possible influence from thyroid abnormalities.  She was advised to continue budesonide 9 mg x 2 weeks, then decrease to 6 mg daily, start Bentyl 10 mg daily, continue Tums before meals, low-fat diet, progress report in 4 weeks, follow-up in 2 months.  Patient knows deferred Covid testing for EGD.  We were unable to reach her to reschedule.  Today:  Diarrhea: Has been off of Entocort for several months at this point.  Watery BMs 5-6 times a day but can be more. Also with nocturnal stools.  BMs are uncontrollable at times.  No BRBPR or melena.  No recent antibiotcs. No hospitalizations. No well water. Down about 12 lbs since last visit. Appetite isn't good because when she eats, she has to have a BM. Had a hot dog and fries yesterday. Usually, she doesn't eat fried/fatty foods.  Intermittent nausea without vomiting since her diarrhea has been a problem. Occurs 3 times a week. No triggers. Limiting dairy and drinking lactose free milk. No associated abdominal pain.   Has Entocort at home which was recently prescribed by PCP but she has not started this.  Epigastric abdominal pain about 3 months ago, but none in the last 3 months. No NSAIDs.  GERD is well controlled on Protonix 40 mg daily.  Continues with daily dysphagia to solid foods, solids, and liquids. No regurgitation. Present for at least 1 year. No worsening. Ready to proceed with EGD.   White patches in her mouth resolved with  Diflucan for several months but has returned. PCP has not addressed this.   Elevated AST: Received labs completed 10/01/2020.  AST elevated at 45, ALT 24, alk phos 58, total bilirubin 0.5.  No alcohol or illicit drug use, frequent use, OTC supplements, or herbal teas.   Past Medical History:  Diagnosis Date   Depression    GERD (gastroesophageal reflux disease)    History of colon polyps    remote past, unable to retrieve records    Hyperlipidemia    Hypothyroidism     Lymphocytic colitis    Rheumatoid arthritis [facility])     Past Surgical History:  Procedure Laterality Date   COLONOSCOPY     last one was about 3 yrs in [facility]   COLONOSCOPY N/A 02/19/2016   Dr. Gala Romney: three 3-6 mm polyps (tubular adenomas) in ascending colon, segmental biopsies consistent with lymphocytic colitis. Prescribed entocort.    COLONOSCOPY N/A 03/06/2019   Dr. Gala Romney: 2 polyps removed, tubular adenomas, diverticulosis.  NO random colon biopsies performed.  Next colonoscopy 5 years.   ESOPHAGOGASTRODUODENOSCOPY N/A 08/11/2014   RMR: MIld erosive  reflux esophagitis. small hiatal hernia. Abnormal gastric muocsa uncertain significance. Subtly abnormal duodeanl (bulbar) mucosa- status post multiple biopsies. BENIGN small bowel mucosa, no evidence of villous blulnting, mild reactive gastropathy on stomach biopsy    GALLBLADDER SURGERY     LEG SURGERY     right   PARTIAL HYSTERECTOMY      Current Outpatient Medications  Medication Sig Dispense Refill   albuterol (VENTOLIN HFA) 108 (90 Base) MCG/ACT inhaler Inhale 1 puff into the lungs every 6 (six) hours as needed for wheezing or shortness of breath.      ANORO ELLIPTA 62.5-25 MCG/INH AEPB Inhale 1 puff into the lungs daily.     Calcium Carb-Cholecalciferol (CALCIUM 600 + D PO) Take 1 tablet by mouth 2 (two) times daily.     cycloSPORINE (RESTASIS) 0.05 % ophthalmic emulsion Place 1 drop into both eyes 3 (three) times daily.     fexofenadine (ALLEGRA) 180 MG tablet Take 180 mg by mouth daily as needed for allergies or rhinitis.     fluticasone (FLONASE) 50 MCG/ACT nasal spray Place 2 sprays into both nostrils 2 (two) times daily.  1   hydrOXYzine (ATARAX/VISTARIL) 25 MG tablet Take 25 mg by mouth as needed for itching.     levothyroxine (SYNTHROID, LEVOTHROID) 100 MCG tablet Take 100 mcg by mouth daily before breakfast.     methocarbamol (ROBAXIN) 500 MG tablet Take 500 mg by mouth as needed for muscle spasms.      mirabegron ER (MYRBETRIQ) 25 MG TB24 tablet Take 25 mg by mouth at bedtime.     pantoprazole (PROTONIX) 40 MG tablet TAKE ONE TABLET BY MOUTH ONCE DAILY BEFORE BREAKFAST. (Patient taking differently: Take 40 mg by mouth daily.) 90 tablet 3   potassium chloride SA (KLOR-CON) 20 MEQ tablet Take 40 mEq by mouth daily.      PROLIA 60 MG/ML SOSY injection every 6 (six) months.     simvastatin (ZOCOR) 40 MG tablet Take 40 mg by mouth every evening.     torsemide (DEMADEX) 20 MG tablet Take 40 mg by mouth daily.      budesonide (ENTOCORT EC) 3 MG 24 hr capsule Take 3-9 mg by mouth See admin instructions. Take 9 mg daily for 2 weeks, 6 mg daily for 2 weeks, then 3 mg daily for 2 weeks then stop (Patient not taking: Reported on 10/09/2020)  fluconazole (DIFLUCAN) 100 MG tablet Take 1 tablet (100 mg total) by mouth daily. 7 tablet 0   No current facility-administered medications for this visit.    Allergies as of 10/09/2020 - Review Complete 10/09/2020  Allergen Reaction Noted   Desyrel [trazodone]  07/14/2014   Keflex [cephalexin]  07/14/2014   Klonopin [clonazepam]  07/14/2014   Minocin [minocycline hcl]  07/14/2014   Stelazine [trifluoperazine]  07/14/2014   Viberzi [eluxadoline]  03/31/2015    Family History  Problem Relation Age of Onset   Cancer Sister        blood disorder ??   Colon cancer Neg Hx     Social History   Socioeconomic History   Marital status: Married    Spouse name: Not on file   Number of children: 2   Years of education: Not on file   Highest education level: Not on file  Occupational History   Occupation: disability  Tobacco Use   Smoking status: Current Every Day Smoker    Packs/day: 0.10    Types: Cigarettes   Smokeless tobacco: Never Used   Tobacco comment: quit 03/31/18  Substance and Sexual Activity   Alcohol use: No    Alcohol/week: 0.0 standard drinks   Drug use: No   Sexual activity: Not on file  Other Topics  Concern   Not on file  Social History Narrative   Not on file   Social Determinants of Health   Financial Resource Strain: Not on file  Food Insecurity: Not on file  Transportation Needs: Not on file  Physical Activity: Not on file  Stress: Not on file  Social Connections: Not on file    Review of Systems: Gen: Denies fever, chills, cold or flulike symptoms, presyncope, syncope. CV: Denies chest pain or palpitations. Resp: Admits to intermittent shortness of breath.  No regular cough. GI: See HPI Heme: See HPI  Physical Exam: BP 111/67    Pulse 88    Temp (!) 96.8 F (36 C) (Temporal)    Ht 5' 4"  (1.626 m)    Wt 140 lb 12.8 oz (63.9 kg)    BMI 24.17 kg/m  General:   Alert and oriented. No distress noted. Pleasant and cooperative.  Head:  Normocephalic and atraumatic. Eyes:  Conjuctiva clear without scleral icterus. Mouth:  Tongue is covered with white plaque. No perioral, buccal, or pharyngeal lesions.  Heart:  S1, S2 present without murmurs appreciated. Lungs:  Clear to auscultation bilaterally. No wheezes, rales, or rhonchi. No distress.  Abdomen: +BS, soft, non-tender and non-distended. No rebound or guarding. No HSM or masses noted. Msk:  Symmetrical without gross deformities. Normal posture. Extremities:  Trace LE edema. Neurologic:  Alert and  oriented x4 Psych:  Normal mood and affect.  Labs: 10/01/2020 CBC: WBC 6.9, hemoglobin 15.5, hematocrit 47.2 (H), MCV 93, MCH 30.6, MCHC 32.8, platelets 238 CMP: Glucose 90, BUN 7, creatinine 0.75, sodium 142, potassium 3.9, calcium 8.7 albumin 3.6 (L), total bilirubin 0.5, alk phos 58, AST 45 (H), ALT 24 TSH: 1.320 Free T4: 1.62 Lipid panel: Total cholesterol 135, triglycerides 88, HDL 44, LDL 74   Assessment:  75 y.o. female with GI history of GERD and lymphocytic colitis diagnosed in 2017 presenting today for further evaluation of diarrhea and dysphagia. Also oropharyngeal candidiasis and recently found to have elevated  AST.  Diarrhea: History of lymphocytic colitis.  Previously responded well to Entocort but we have not been able to taper off of Entocort for any length of time  since October 2020. Unfortunately, patient ran out of Entocort several months ago and started having diarrhea thereafter.  Currently with 5-6+ watery BMs daily, nocturnal BMs, incontinence at times, and associated 12 pound weight loss over the last 8 months.  Intermittent nausea without vomiting since diarrhea began.  Poor appetite as eating worsens diarrhea. Denies BRBPR, melena, associated abdominal pain, fever or chills.  Denies recent antibiotics, hospitalizations, well water, or sick contacts. Recent labs with PCP on 3/3 with CBC essentially normal, electrolytes and kidney function within normal limits, TSH within normal limits. Colonoscopy is up to date in 2020.   Suspect we are dealing with a flare of lymphocytic colitis. Unfortunately she does continue smoking and I explained this increases her risk of flares. She is not taking any NSAID products. Cannot rule out infectious diarrhea.  Doubt celiac disease but will also screen for this. May also have component of bile salt diarrhea. We will plan check GI pathogen panel and C. Diff. If negative, will resume Entocort 11m daily.    Dysphagia: Daily dysphagia to solid foods, soft foods, and liquids for at least 1 year.  Feels items get hung at the sternal notch.  No regurgitation.  Denies odynophagia. Chronic history of GERD that is well controlled on Protonix 40 mg daily. Notably, on exam she has evidence of oral candidiasis. Differentials include esophageal candidiasis, EOE, esophageal web, ring, or stricture, less likely malignancy. She will need EGD for further evaluation and management.   Oral candidiasis: White plaque on tongue previously improved with Diflucan in July 2021, which I prescribed at that time, but returned several months later. I will prescribe another course of diflucan. I  have advised she follow-up with PCP to determine why she continues to have recurrent oral candidiasis.   Elevated LFTs: Labs completed 10/01/20 with AST slightly elevated at 45.  ALT, alk phos, total bilirubin within normal limits.  Per chart review, patient had slight elevation of ALT back in 2019 at 52, otherwise LFTs have been within normal limits.  History of hepatic cysts and some evidence of focal fatty deposition on prior CT.  Also with chronic history of mild biliary ductal dilation which has been suspected to be secondary to postcholecystectomy state.  No history of alcohol use, drug use, regular Tylenol use, OTC supplements, or herbal teas.  Chronically on simvastatin which may be contributing to mild LFT elevation.  We will plan to screen for hepatitis, hemochromatosis, celiac disease in light of chronic diarrhea, and update abdominal ultrasound.   Plan:  1. Labs: C. difficile, GI pathogen panel, IgA total, TTG IgA, hepatitis C antibody, hepatitis A antibody total, hepatitis B surface antibody, hepatitis B surface antigen, otitis B core antibody total, iron panel with ferritin. 2.  Abdominal ultrasound. 3.  Diflucan 100 mg daily x7 days for oral candidiasis. No simvastatin while taking Diflucan. 4.  Recommend to follow-up with PCP for further evaluation of recurrent oral candidiasis. 5.  Proceed with EGD +/-dilation with Dr. RGala Romney The risks, benefits, and alternatives have been discussed with the patient in detail. The patient states understanding and desires to proceed.  ASA II 6.  Continue Protonix 40 mg daily 30 minutes before breakfast. 7.  Low fat diet.  8.  Drink enough fluids to keep urine pale yellow to clear.   9.  Further recommendations regarding diarrhea after labs/stool studies. Plan to resume Entocort 968mdaily if no infectious diarrhea.  10.  Counseled on importance of smoking cessation.  11.  Continue to  avoid NSAIDs.  12.  Follow-up in office after EGD.    Aliene Altes, PA-C [facility] 10/09/2020

## 2020-10-09 ENCOUNTER — Encounter: Payer: Self-pay | Admitting: Gastroenterology

## 2020-10-09 ENCOUNTER — Other Ambulatory Visit: Payer: Self-pay

## 2020-10-09 ENCOUNTER — Encounter: Payer: Self-pay | Admitting: *Deleted

## 2020-10-09 ENCOUNTER — Ambulatory Visit: Payer: Medicare HMO | Admitting: Gastroenterology

## 2020-10-09 VITALS — BP 111/67 | HR 88 | Temp 96.8°F | Ht 64.0 in | Wt 140.8 lb

## 2020-10-09 DIAGNOSIS — R1310 Dysphagia, unspecified: Secondary | ICD-10-CM | POA: Diagnosis not present

## 2020-10-09 DIAGNOSIS — B370 Candidal stomatitis: Secondary | ICD-10-CM | POA: Diagnosis not present

## 2020-10-09 DIAGNOSIS — K52832 Lymphocytic colitis: Secondary | ICD-10-CM

## 2020-10-09 DIAGNOSIS — K219 Gastro-esophageal reflux disease without esophagitis: Secondary | ICD-10-CM | POA: Diagnosis not present

## 2020-10-09 DIAGNOSIS — R7989 Other specified abnormal findings of blood chemistry: Secondary | ICD-10-CM

## 2020-10-09 DIAGNOSIS — K529 Noninfective gastroenteritis and colitis, unspecified: Secondary | ICD-10-CM | POA: Diagnosis not present

## 2020-10-09 DIAGNOSIS — R945 Abnormal results of liver function studies: Secondary | ICD-10-CM

## 2020-10-09 DIAGNOSIS — E611 Iron deficiency: Secondary | ICD-10-CM | POA: Diagnosis not present

## 2020-10-09 MED ORDER — FLUCONAZOLE 100 MG PO TABS: 100.0000 mg | ORAL_TABLET | Freq: Every day | ORAL | 0 refills | Status: DC

## 2020-10-09 NOTE — Patient Instructions (Addendum)
Please have labs and stool studies completed at [name].  We will arrange for you to have an ultrasound of your abdomen to further evaluate your elevated liver function test.  We will arrange for you to have an upper endoscopy with possible dilation of your esophagus in the near future with Dr. Gala Romney.  I have sent a prescription for Diflucan 100 mg tablet for you to take daily x7 days.  This is to treat oropharyngeal candidiasis (the white patches in your mouth).  Please follow-up with Dr. Nevada Crane on this. Do not take simvastatin while taking Diflucan.  Ensure that you are drinking enough fluids to keep your urine pale yellow to clear.  For diarrhea:  Follow a low-fat diet.  Avoid fried, fatty, greasy foods. All meats should be lean (poultry or fish) and baked, boiled, or broiled. Avoid spicy foods. Further recommendations to follow completing stool studies.  For acid reflux: Continue Protonix 40 mg daily 30 minutes before breakfast.  We will plan to see you back in the office after your upper endoscopy.  We will be in contact with you regarding lab results and further recommendations.  Do not hesitate to call if you have any questions or concerns.  It was good to see you today!  Aliene Altes, PA-C [facility]

## 2020-10-12 LAB — IRON,TIBC AND FERRITIN PANEL
%SAT: 37 % (calc) (ref 16–45)
Ferritin: 71 ng/mL (ref 16–288)
Iron: 110 ug/dL (ref 45–160)
TIBC: 300 mcg/dL (calc) (ref 250–450)

## 2020-10-12 LAB — IGA: Immunoglobulin A: 211 mg/dL (ref 70–320)

## 2020-10-12 LAB — HEPATITIS B SURFACE ANTIBODY,QUALITATIVE: Hep B S Ab: NONREACTIVE

## 2020-10-12 LAB — HEPATITIS C ANTIBODY
Hepatitis C Ab: NONREACTIVE
SIGNAL TO CUT-OFF: 0.01 (ref <1.00)

## 2020-10-12 LAB — HEPATITIS B SURFACE ANTIGEN: Hepatitis B Surface Ag: NONREACTIVE

## 2020-10-12 LAB — HEPATITIS A ANTIBODY, TOTAL: Hepatitis A AB,Total: NONREACTIVE

## 2020-10-12 LAB — TISSUE TRANSGLUTAMINASE, IGA: (tTG) Ab, IgA: <1 U/mL

## 2020-10-12 LAB — HEPATITIS B CORE ANTIBODY, TOTAL: Hep B Core Total Ab: NONREACTIVE

## 2020-10-13 DIAGNOSIS — R945 Abnormal results of liver function studies: Secondary | ICD-10-CM | POA: Diagnosis not present

## 2020-10-13 DIAGNOSIS — K529 Noninfective gastroenteritis and colitis, unspecified: Secondary | ICD-10-CM | POA: Diagnosis not present

## 2020-10-13 DIAGNOSIS — Z79899 Other long term (current) drug therapy: Secondary | ICD-10-CM | POA: Diagnosis not present

## 2020-10-13 DIAGNOSIS — E611 Iron deficiency: Secondary | ICD-10-CM | POA: Diagnosis not present

## 2020-10-13 DIAGNOSIS — R197 Diarrhea, unspecified: Secondary | ICD-10-CM | POA: Diagnosis not present

## 2020-10-15 ENCOUNTER — Ambulatory Visit (HOSPITAL_COMMUNITY)
Admission: RE | Admit: 2020-10-15 | Discharge: 2020-10-15 | Disposition: A | Discharge status: Home / Self Care | Payer: Medicare HMO | Source: Ambulatory Visit | Attending: Gastroenterology | Admitting: Gastroenterology

## 2020-10-15 ENCOUNTER — Other Ambulatory Visit: Payer: Self-pay

## 2020-10-15 DIAGNOSIS — R945 Abnormal results of liver function studies: Secondary | ICD-10-CM | POA: Diagnosis not present

## 2020-10-15 DIAGNOSIS — Z9049 Acquired absence of other specified parts of digestive tract: Secondary | ICD-10-CM | POA: Diagnosis not present

## 2020-10-15 DIAGNOSIS — K838 Other specified diseases of biliary tract: Secondary | ICD-10-CM | POA: Diagnosis not present

## 2020-10-15 DIAGNOSIS — K7689 Other specified diseases of liver: Secondary | ICD-10-CM | POA: Diagnosis not present

## 2020-10-15 DIAGNOSIS — K760 Fatty (change of) liver, not elsewhere classified: Secondary | ICD-10-CM | POA: Diagnosis not present

## 2020-10-16 LAB — C. DIFFICILE GDH AND TOXIN A/B
GDH ANTIGEN: NOT DETECTED
MICRO NUMBER:: 11650528
SPECIMEN QUALITY:: ADEQUATE
TOXIN A AND B: NOT DETECTED

## 2020-10-16 LAB — GASTROINTESTINAL PATHOGEN PANEL PCR
C. difficile Tox A/B, PCR: NOT DETECTED
Campylobacter, PCR: NOT DETECTED
Cryptosporidium, PCR: NOT DETECTED
E coli (ETEC) LT/ST PCR: NOT DETECTED
E coli (STEC) stx1/stx2, PCR: NOT DETECTED
E coli 0157, PCR: NOT DETECTED
Giardia lamblia, PCR: NOT DETECTED
Norovirus, PCR: NOT DETECTED
Rotavirus A, PCR: NOT DETECTED
Salmonella, PCR: NOT DETECTED
Shigella, PCR: NOT DETECTED

## 2020-10-19 ENCOUNTER — Encounter: Payer: Self-pay | Admitting: Internal Medicine

## 2020-10-21 ENCOUNTER — Ambulatory Visit (HOSPITAL_COMMUNITY): Payer: Medicare HMO

## 2020-10-21 ENCOUNTER — Ambulatory Visit: Payer: Medicare HMO | Admitting: Cardiology

## 2020-10-21 NOTE — Progress Notes (Deleted)
Clinical Summary Jennifer [name] is a 75 y.o.female seen today as a new consult, referred by Dr Nevada Crane for leg edema and palpitations.   1. LE edema - comes and goes, ongoing x 10 years.  - seems to be worst  09/2016 echo LVEF 60-65%, no WMAs, grade I DDx - pcp had started torsemide, seems to be controlled with torsemide.   11/2019 labs Na 147, K 2.9, Cr 0.84, TSH 0.5, BNP 76 - has compression stockings.  - swelling is stable she reports    2. Palpitations - only occurs for a few minutes after activity, not at rest - feeling of heart pounding - lasts just a few minutes, then resolves - started about 98month ago - symptoms occur once every 3 weeks, overall infrequent and short in duration   Past Medical History:  Diagnosis Date  . Depression   . GERD (gastroesophageal reflux disease)   . History of colon polyps    remote past, unable to retrieve records   . Hyperlipidemia   . Hypothyroidism   . Lymphocytic colitis   . Rheumatoid arthritis (HCC)      Allergies  Allergen Reactions  . Desyrel [Trazodone]     Body shaking   . Keflex [Cephalexin]     unknown  . Klonopin [Clonazepam]     unknown  . Minocin [Minocycline Hcl]     unknown  . Stelazine [Trifluoperazine]     Mouth was crossing over   . Viberzi [Eluxadoline]     abd pain, dries out eyes,      Current Outpatient Medications  Medication Sig Dispense Refill  . albuterol (VENTOLIN HFA) 108 (90 Base) MCG/ACT inhaler Inhale 1 puff into the lungs every 6 (six) hours as needed for wheezing or shortness of breath.     [name] ELLIPTA 62.5-25 MCG/INH AEPB Inhale 1 puff into the lungs daily.    . budesonide (ENTOCORT EC) 3 MG 24 hr capsule Take 3-9 mg by mouth See admin instructions. Take 9 mg daily for 2 weeks, 6 mg daily for 2 weeks, then 3 mg daily for 2 weeks then stop (Patient not taking: Reported on 10/09/2020)    . Calcium Carb-Cholecalciferol (CALCIUM 600 + D PO) Take 1 tablet by mouth 2 (two) times daily.     . cycloSPORINE (RESTASIS) 0.05 % ophthalmic emulsion Place 1 drop into both eyes 3 (three) times daily.    . fexofenadine (ALLEGRA) 180 MG tablet Take 180 mg by mouth daily as needed for allergies or rhinitis.    . fluconazole (DIFLUCAN) 100 MG tablet Take 1 tablet (100 mg total) by mouth daily. 7 tablet 0  . fluticasone (FLONASE) 50 MCG/ACT nasal spray Place 2 sprays into both nostrils 2 (two) times daily.  1  . hydrOXYzine (ATARAX/VISTARIL) 25 MG tablet Take 25 mg by mouth as needed for itching.    . levothyroxine (SYNTHROID, LEVOTHROID) 100 MCG tablet Take 100 mcg by mouth daily before breakfast.    . methocarbamol (ROBAXIN) 500 MG tablet Take 500 mg by mouth as needed for muscle spasms.    . mirabegron ER (MYRBETRIQ) 25 MG TB24 tablet Take 25 mg by mouth at bedtime.    . pantoprazole (PROTONIX) 40 MG tablet TAKE ONE TABLET BY MOUTH ONCE DAILY BEFORE BREAKFAST. (Patient taking differently: Take 40 mg by mouth daily.) 90 tablet 3  . potassium chloride SA (KLOR-CON) 20 MEQ tablet Take 40 mEq by mouth daily.     Marland Kitchen PROLIA 60 MG/ML SOSY  injection every 6 (six) months.    . simvastatin (ZOCOR) 40 MG tablet Take 40 mg by mouth every evening.    . torsemide (DEMADEX) 20 MG tablet Take 40 mg by mouth daily.      No current facility-administered medications for this visit.     Past Surgical History:  Procedure Laterality Date  . COLONOSCOPY     last one was about 3 yrs in [place]  . COLONOSCOPY N/A 02/19/2016   Dr. Gala Romney: three 3-6 mm polyps (tubular adenomas) in ascending colon, segmental biopsies consistent with lymphocytic colitis. Prescribed entocort.   . COLONOSCOPY N/A 03/06/2019   Dr. Gala Romney: 2 polyps removed, tubular adenomas, diverticulosis.  NO random colon biopsies performed.  Next colonoscopy 5 years.  . ESOPHAGOGASTRODUODENOSCOPY N/A 08/11/2014   RMR: MIld erosive  reflux esophagitis. small hiatal hernia. Abnormal gastric muocsa uncertain significance. Subtly abnormal duodeanl  (bulbar) mucosa- status post multiple biopsies. BENIGN small bowel mucosa, no evidence of villous blulnting, mild reactive gastropathy on stomach biopsy   . GALLBLADDER SURGERY    . LEG SURGERY     right  . PARTIAL HYSTERECTOMY       Allergies  Allergen Reactions  . Desyrel [Trazodone]     Body shaking   . Keflex [Cephalexin]     unknown  . Klonopin [Clonazepam]     unknown  . Minocin [Minocycline Hcl]     unknown  . Stelazine [Trifluoperazine]     Mouth was crossing over   . Viberzi [Eluxadoline]     abd pain, dries out eyes,       Family History  Problem Relation Age of Onset  . Cancer Sister        blood disorder ??  . Colon cancer Neg Hx      Social History Jennifer [name] reports that she has been smoking cigarettes. She has been smoking about 0.10 packs per day. She has never used smokeless tobacco. Jennifer [name] reports no history of alcohol use.   Review of Systems CONSTITUTIONAL: No weight loss, fever, chills, weakness or fatigue.  HEENT: Eyes: No visual loss, blurred vision, double vision or yellow sclerae.No hearing loss, sneezing, congestion, runny nose or sore throat.  SKIN: No rash or itching.  CARDIOVASCULAR:  RESPIRATORY: No shortness of breath, cough or sputum.  GASTROINTESTINAL: No anorexia, nausea, vomiting or diarrhea. No abdominal pain or blood.  GENITOURINARY: No burning on urination, no polyuria NEUROLOGICAL: No headache, dizziness, syncope, paralysis, ataxia, numbness or tingling in the extremities. No change in bowel or bladder control.  MUSCULOSKELETAL: No muscle, back pain, joint pain or stiffness.  LYMPHATICS: No enlarged nodes. No history of splenectomy.  PSYCHIATRIC: No history of depression or anxiety.  ENDOCRINOLOGIC: No reports of sweating, cold or heat intolerance. No polyuria or polydipsia.  Marland Kitchen   Physical Examination There were no vitals filed for this visit. There were no vitals filed for this visit.  Gen: resting comfortably, no acute  distress HEENT: no scleral icterus, pupils equal round and reactive, no palptable cervical adenopathy,  CV Resp: Clear to auscultation bilaterally GI: abdomen is soft, non-tender, non-distended, normal bowel sounds, no hepatosplenomegaly MSK: extremities are warm, no edema.  Skin: warm, no rash Neuro:  no focal deficits Psych: appropriate affect   Diagnostic Studies     Assessment and Plan   1. Leg edema - several year history of intermittent edema, she reports is unchanged and well controlled with torsemide - echo 2018 with just mild diastolic dysfunction. In absence of significant  change in swelling would not repeat study - would think likely combination of mild diastolic dysfunction and probable venous insufficiency. Had a recent normal BNP arguing against significant HF - continue torsemide, im ok just taking as needed. Has compression stockings, can wear on days where swelling is increased  2. Palpitations - fairly infrequent symptoms, only lasting a minute or so after exertion. No resting symptoms - infrequent exertional symptoms, if was a significant arrhythmia likely would have symptoms at rest and more frequent - monitor at this time, if progression would plan for event monitor - EKG today shows NSR  3. Abnormal EKG EKG SR, LAFB. Looks like anteroseptal [place] but echo shows normal wall motion without wall motion abnormality - no further workup      Arnoldo Lenis, M.D., F.A.C.C.

## 2020-10-28 ENCOUNTER — Other Ambulatory Visit: Payer: Self-pay | Admitting: *Deleted

## 2020-10-28 DIAGNOSIS — R600 Localized edema: Secondary | ICD-10-CM | POA: Diagnosis not present

## 2020-10-28 DIAGNOSIS — R7301 Impaired fasting glucose: Secondary | ICD-10-CM | POA: Diagnosis not present

## 2020-10-28 DIAGNOSIS — J019 Acute sinusitis, unspecified: Secondary | ICD-10-CM | POA: Diagnosis not present

## 2020-10-28 DIAGNOSIS — I3139 Other pericardial effusion (noninflammatory): Secondary | ICD-10-CM

## 2020-10-28 DIAGNOSIS — I313 Pericardial effusion (noninflammatory): Secondary | ICD-10-CM | POA: Diagnosis not present

## 2020-10-28 DIAGNOSIS — J449 Chronic obstructive pulmonary disease, unspecified: Secondary | ICD-10-CM | POA: Diagnosis not present

## 2020-10-28 DIAGNOSIS — M810 Age-related osteoporosis without current pathological fracture: Secondary | ICD-10-CM | POA: Diagnosis not present

## 2020-10-28 DIAGNOSIS — K52832 Lymphocytic colitis: Secondary | ICD-10-CM | POA: Diagnosis not present

## 2020-10-28 DIAGNOSIS — E039 Hypothyroidism, unspecified: Secondary | ICD-10-CM | POA: Diagnosis not present

## 2020-10-28 DIAGNOSIS — R109 Unspecified abdominal pain: Secondary | ICD-10-CM | POA: Diagnosis not present

## 2020-10-28 DIAGNOSIS — E782 Mixed hyperlipidemia: Secondary | ICD-10-CM | POA: Diagnosis not present

## 2020-10-28 DIAGNOSIS — R0600 Dyspnea, unspecified: Secondary | ICD-10-CM | POA: Diagnosis not present

## 2020-10-28 DIAGNOSIS — E46 Unspecified protein-calorie malnutrition: Secondary | ICD-10-CM | POA: Diagnosis not present

## 2020-11-03 ENCOUNTER — Ambulatory Visit: Payer: Medicare HMO | Admitting: Family Medicine

## 2020-11-04 ENCOUNTER — Ambulatory Visit (HOSPITAL_COMMUNITY): Payer: Medicare HMO

## 2020-11-04 ENCOUNTER — Encounter (HOSPITAL_COMMUNITY): Payer: Self-pay

## 2020-11-12 NOTE — Progress Notes (Deleted)
Cardiology Office Note  Date: 11/12/2020   ID: Jennifer, [name] 07/29/1946, MRN [id number]  PCP:  Celene Squibb, MD  Cardiologist:  Carlyle Dolly, MD Electrophysiologist:  None   Chief Complaint: Cardiac follow-up  History of Present Illness: Jennifer [name] is a 75 y.o. female with a history of leg edema, palpitations, nonspecific abnormal EKG.  HLD, hypothyroidism, rheumatoid arthritis, GERD.  Last seen by Dr. Harl Bowie 02/06/2020 for lower extremity edema, palpitations, abnormal EKG.  Leg edema had been ongoing for 10 years.  PCP had started torsemide which seem to be controlling the lower extremity edema.  Previous echo 2018 showed EF 60 to 65%.  No WMA's, grade 1 DDx.  She had compression stockings.  Swelling was stable.  Palpitations only occurring for a few minutes after activity not at rest.  Described as feeling of heart pounding lasting a few minutes then resolved.  Started 2 months prior to visit.  Occurring every 3 weeks approximately.  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  Palpitations were fairly infrequent.  Continue to monitor.  If progression would plan for event monitor.   Past Medical History:  Diagnosis Date  . Depression   . GERD (gastroesophageal reflux disease)   . History of colon polyps    remote past, unable to retrieve records   . Hyperlipidemia   . Hypothyroidism   . Lymphocytic colitis   . Rheumatoid arthritis [facility])     Past Surgical History:  Procedure Laterality Date  . COLONOSCOPY     last one was about 3 yrs in [place]  . COLONOSCOPY N/A 02/19/2016   Dr. Gala Romney: three 3-6 mm polyps (tubular adenomas) in ascending colon, segmental biopsies consistent with lymphocytic colitis. Prescribed entocort.   . COLONOSCOPY N/A 03/06/2019   Dr. Gala Romney: 2 polyps removed, tubular adenomas, diverticulosis.  NO random colon biopsies performed.  Next colonoscopy 5 years.  . ESOPHAGOGASTRODUODENOSCOPY N/A 08/11/2014   RMR: MIld  erosive  reflux esophagitis. small hiatal hernia. Abnormal gastric muocsa uncertain significance. Subtly abnormal duodeanl (bulbar) mucosa- status post multiple biopsies. BENIGN small bowel mucosa, no evidence of villous blulnting, mild reactive gastropathy on stomach biopsy   . GALLBLADDER SURGERY    . LEG SURGERY     right  . PARTIAL HYSTERECTOMY      Current Outpatient Medications  Medication Sig Dispense Refill  . acetaminophen (TYLENOL) 500 MG tablet Take 500 mg by mouth 2 (two) times daily as needed (pain.).    Marland Kitchen albuterol (VENTOLIN HFA) 108 (90 Base) MCG/ACT inhaler Inhale 1 puff into the lungs every 4 (four) hours as needed for wheezing or shortness of breath.    [name] ELLIPTA 62.5-25 MCG/INH AEPB Inhale 1 puff into the lungs every evening.    . budesonide (ENTOCORT EC) 3 MG 24 hr capsule Take 9 mg by mouth in the morning.    . Calcium Carb-Cholecalciferol (CALCIUM 600 + D PO) Take 1 tablet by mouth 2 (two) times daily.    . cycloSPORINE (RESTASIS) 0.05 % ophthalmic emulsion Place 1 drop into both eyes 3 (three) times daily.    . famotidine (PEPCID) 20 MG tablet Take 20 mg by mouth 2 (two) times daily as needed (itching).    . fluconazole (DIFLUCAN) 100 MG tablet Take 1 tablet (100 mg total) by mouth daily. 7 tablet 0  . fluticasone (FLONASE) 50 MCG/ACT nasal spray Place 2 sprays into both nostrils daily.  1  . hydrOXYzine (ATARAX/VISTARIL) 25 MG tablet  Take 25 mg by mouth every 6 (six) hours as needed for itching (allergic reaction/allergies).    . hydrOXYzine (VISTARIL) 25 MG capsule Take 25 mg by mouth 3 (three) times daily as needed (severe itching).    Marland Kitchen levothyroxine (SYNTHROID) 75 MCG tablet Take 75 mcg by mouth daily before breakfast.    . methocarbamol (ROBAXIN) 500 MG tablet Take 500 mg by mouth every 6 (six) hours as needed for muscle spasms.    . mirabegron ER (MYRBETRIQ) 25 MG TB24 tablet Take 25 mg by mouth at bedtime.    . pantoprazole (PROTONIX) 40 MG tablet TAKE ONE  TABLET BY MOUTH ONCE DAILY BEFORE BREAKFAST. (Patient taking differently: Take 40 mg by mouth daily before breakfast.) 90 tablet 3  . potassium chloride SA (KLOR-CON) 20 MEQ tablet Take 20 mEq by mouth See admin instructions. Take 1 tablet (20 meq) by mouth scheduled every morning, if patient takes torsemide take 3 tablets (60 meq) in the morning    . PROLIA 60 MG/ML SOSY injection Inject 60 mg into the skin every 6 (six) months.    . simvastatin (ZOCOR) 40 MG tablet Take 40 mg by mouth every evening.    . torsemide (DEMADEX) 20 MG tablet Take 40 mg by mouth daily as needed (fluid retention).     No current facility-administered medications for this visit.   Allergies:  Desyrel [trazodone], Keflex [cephalexin], Klonopin [clonazepam], Minocin [minocycline hcl], Stelazine [trifluoperazine], and Viberzi [eluxadoline]   Social History: The patient  reports that she has been smoking cigarettes. She has been smoking about 0.10 packs per day. She has never used smokeless tobacco. She reports that she does not drink alcohol and does not use drugs.   Family History: The patient's family history includes Cancer in her sister.   ROS:  Please see the history of present illness. Otherwise, complete review of systems is positive for {NONE DEFAULTED:18576::"none"}.  All other systems are reviewed and negative.   Physical Exam: VS:  There were no vitals taken for this visit., BMI There is no height or weight on file to calculate BMI.  Wt Readings from Last 3 Encounters:  10/09/20 140 lb 12.8 oz (63.9 kg)  02/14/20 152 lb 9.6 oz (69.2 kg)  02/06/20 153 lb 9.6 oz (69.7 kg)    General: Patient appears comfortable at rest. HEENT: Conjunctiva and lids normal, oropharynx clear with moist mucosa. Neck: Supple, no elevated JVP or carotid bruits, no thyromegaly. Lungs: Clear to auscultation, nonlabored breathing at rest. Cardiac: Regular rate and rhythm, no S3 or significant systolic murmur, no pericardial  rub. Abdomen: Soft, nontender, no hepatomegaly, bowel sounds present, no guarding or rebound. Extremities: No pitting edema, distal pulses 2+. Skin: Warm and dry. Musculoskeletal: No kyphosis. Neuropsychiatric: Alert and oriented x3, affect grossly appropriate.  ECG:  {EKG/Telemetry Strips Reviewed:[id number]}  Recent Labwork: No results found for requested labs within last 8760 hours.  No results found for: CHOL, TRIG, HDL, CHOLHDL, VLDL, LDLCALC, LDLDIRECT  Other Studies Reviewed Today:  Echocardiogram 09/12/2016 Study Conclusions   - Left ventricle: The cavity size was normal. Wall thickness was  normal. Systolic function was normal. The estimated ejection  fraction was in the range of 60% to 65%. Wall motion was normal;  there were no regional wall motion abnormalities. Doppler  parameters are consistent with abnormal left ventricular  relaxation (grade 1 diastolic dysfunction).  - Aortic valve: Mildly calcified annulus. Trileaflet; normal  thickness leaflets. Valve area (VTI): 1.94 cm^2. Valve area  (Vmax): 1.75 cm^2.  Valve area (Vmean): 1.78 cm^2.  - Technically adequate study.  Assessment and Plan:  1. Leg edema   2. Palpitations   3. Nonspecific abnormal electrocardiogram (ECG) (EKG)      Medication Adjustments/Labs and Tests Ordered: Current medicines are reviewed at length with the patient today.  Concerns regarding medicines are outlined above.   Disposition: Follow-up with ***  Signed, Levell July, NP 11/12/2020 12:53 PM    [place] at [facility] [place], [place], [place] [address] Phone: [phone]; Fax: [phone]

## 2020-11-13 ENCOUNTER — Ambulatory Visit: Payer: Medicare HMO | Admitting: Family Medicine

## 2020-11-13 DIAGNOSIS — R002 Palpitations: Secondary | ICD-10-CM

## 2020-11-13 DIAGNOSIS — R9431 Abnormal electrocardiogram [ECG] [EKG]: Secondary | ICD-10-CM

## 2020-11-13 DIAGNOSIS — R600 Localized edema: Secondary | ICD-10-CM

## 2020-11-23 ENCOUNTER — Other Ambulatory Visit: Payer: Self-pay

## 2020-11-23 ENCOUNTER — Other Ambulatory Visit: Payer: Self-pay | Admitting: Gastroenterology

## 2020-11-23 ENCOUNTER — Other Ambulatory Visit (HOSPITAL_COMMUNITY)
Admission: RE | Admit: 2020-11-23 | Discharge: 2020-11-23 | Disposition: A | Discharge status: Home / Self Care | Payer: Medicare HMO | Source: Ambulatory Visit | Attending: Internal Medicine | Admitting: Internal Medicine

## 2020-11-23 ENCOUNTER — Telehealth: Payer: Self-pay | Admitting: *Deleted

## 2020-11-23 ENCOUNTER — Telehealth: Payer: Self-pay

## 2020-11-23 DIAGNOSIS — Z20822 Contact with and (suspected) exposure to covid-19: Secondary | ICD-10-CM | POA: Insufficient documentation

## 2020-11-23 DIAGNOSIS — Z01812 Encounter for preprocedural laboratory examination: Secondary | ICD-10-CM | POA: Diagnosis not present

## 2020-11-23 DIAGNOSIS — Z23 Encounter for immunization: Secondary | ICD-10-CM | POA: Diagnosis not present

## 2020-11-23 DIAGNOSIS — M810 Age-related osteoporosis without current pathological fracture: Secondary | ICD-10-CM | POA: Diagnosis not present

## 2020-11-23 DIAGNOSIS — E876 Hypokalemia: Secondary | ICD-10-CM

## 2020-11-23 LAB — BASIC METABOLIC PANEL
Anion gap: 12 (ref 5–15)
BUN: 13 mg/dL (ref 8–23)
CO2: 33 mmol/L — ABNORMAL HIGH (ref 22–32)
Calcium: 9.3 mg/dL (ref 8.9–10.3)
Chloride: 94 mmol/L — ABNORMAL LOW (ref 98–111)
Creatinine, Ser: 0.84 mg/dL (ref 0.44–1.00)
GFR, Estimated: >60 mL/min (ref >60)
Glucose, Bld: 90 mg/dL (ref 70–99)
Potassium: 3 mmol/L — ABNORMAL LOW (ref 3.5–5.1)
Sodium: 139 mmol/L (ref 135–145)

## 2020-11-23 LAB — SARS CORONAVIRUS 2 (TAT 6-24 HRS): SARS Coronavirus 2: NEGATIVE

## 2020-11-23 NOTE — Telephone Encounter (Signed)
Called patient no new patient appt with Dr. Posey Pronto

## 2020-11-23 NOTE — Telephone Encounter (Signed)
-----   Message from Erma Heritage, [place] sent at 11/23/2020 12:50 PM EDT ----- Regarding: RE: Pericardial Effusion Hi Cyril Mourning,   It looks like she has an echocardiogram scheduled next month as previously recommended by Dr. Harl Bowie and follow-up with the NP in [place] afterwards.   Zackerie Sara, can you see if her echo or at least her office visit with Jonni Sanger can be moved up as she may need titration of diuretic therapy? Her last office visit was in 01/2020.  Thanks,  [place]   ----- Message ----- From: Roselyn Reef Sent: 11/23/2020  12:37 PM EDT To: Arnoldo Lenis, MD, # Subject: RE: Pericardial Effusion                       Hi Dr. Harl Bowie and [place],   Reaching out to both of you today due to this being a bit more urgent. I spoke with this patient today. She tells me she is having to take torsemide daily for the past week due to increased swelling in her lower extremities as well as increased shortness of breath.  Concerned that this could be influenced by pericardial effusion. Wanted to reach out to you to see if you have any other recommendations for her.   Thanks,   Aliene Altes, PA-C [facility]    ----- Message ----- From: Arnoldo Lenis, MD Sent: 10/28/2020   3:21 PM EDT To: Massie Maroon, CMA, Erenest Rasher, PA-C Subject: RE: Pericardial Effusion                       Thx for update, Andrw Mcguirt can we get this patient an echo for pericardial effusion please   Zandra Abts MD ----- Message ----- From: Roselyn Reef Sent: 10/22/2020  11:40 AM EDT To: Arnoldo Lenis, MD, Erenest Rasher, PA-C Subject: Pericardial Effusion                           Dr. Harl Bowie,   Patient had [place] completed with me this past week. [place] incidentally noted a pericardial effusion, incompletely evaluated. Spoke with the patient. She reports 2.5 months of increased SOB with exertion and cough productive of frothy substance. No CP.   Is there anything I need  to do about this right now, or can we get her back in with you ASAP for follow-up? She said she missed her appointment yesterday as she forgot about it.   She is scheduled for an EGD on 4/27. We can cancel this if needed.   Thanks,  Aliene Altes, [facility]

## 2020-11-23 NOTE — Telephone Encounter (Signed)
Moved appt with BP to Monday 5/2 - LM for pt to confirm she could make this appt

## 2020-11-25 ENCOUNTER — Encounter (HOSPITAL_COMMUNITY): Admission: RE | Payer: Self-pay | Source: Home / Self Care

## 2020-11-25 ENCOUNTER — Ambulatory Visit (HOSPITAL_COMMUNITY): Admission: RE | Admit: 2020-11-25 | Payer: Medicare HMO | Source: Home / Self Care | Admitting: Internal Medicine

## 2020-11-25 SURGERY — EGD (ESOPHAGOGASTRODUODENOSCOPY)
Anesthesia: Moderate Sedation

## 2020-11-29 DIAGNOSIS — R350 Frequency of micturition: Secondary | ICD-10-CM | POA: Diagnosis not present

## 2020-11-29 DIAGNOSIS — J019 Acute sinusitis, unspecified: Secondary | ICD-10-CM | POA: Diagnosis not present

## 2020-11-29 DIAGNOSIS — I10 Essential (primary) hypertension: Secondary | ICD-10-CM | POA: Diagnosis not present

## 2020-11-29 DIAGNOSIS — J309 Allergic rhinitis, unspecified: Secondary | ICD-10-CM | POA: Diagnosis not present

## 2020-11-29 NOTE — Progress Notes (Addendum)
Cardiology Office Note  Date: 11/30/2020   ID: Rakayla, Ricklefs [birth year], MRN [id number]  PCP:  Celene Squibb, MD  Cardiologist:  Carlyle Dolly, MD Electrophysiologist:  None   Chief Complaint: Cardiac follow-up  History of Present Illness: Jennifer [name] is a 75 y.o. female with a history of leg edema, palpitations, nonspecific abnormal EKG.  HLD, hypothyroidism, rheumatoid arthritis, GERD.  Last seen by Dr. Harl Bowie 02/06/2020 for lower extremity edema, palpitations, abnormal EKG.  Leg edema had been ongoing for 10 years.  PCP had started torsemide which seem to be controlling the lower extremity edema.  Previous echo 2018 showed EF 60 to 65%.  No WMA's, grade 1 DDx.  She had compression stockings.  Swelling was stable.  Palpitations only occurring for a few minutes after activity not at rest.  Described as feeling of heart pounding lasting a few minutes then resolved.  Started 2 months prior to visit.  Occurring every 3 weeks approximately.  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  Palpitations were fairly infrequent.  Continue to monitor.  If progression would plan for event monitor.    She is here today for lower extremity edema.  She was referred by GI for lower extremity edema and recent abdominal ultrasound demonstrating a pericardial effusion which was incidentally identified and incompletely evaluated.  States she has been having some swelling in her ankles.  She states when she goes to bed the swelling in her ankles goes away but after she is up during the day the swelling returns.  She has an upcoming echocardiogram on 12/16/2020.  She states she denies any recent shortness of breath but has complained in the past.  She states recently she has been doing more housework and yard work without any shortness of breath/dyspnea.  She denies any palpitations or arrhythmias.  Denies any CVA or TIA-like symptoms, PND, orthopnea, bleeding, claudication, DVT or PE-like  symptoms.  Has some mild pitting lower extremity edema.  Past Medical History:  Diagnosis Date  . Depression   . GERD (gastroesophageal reflux disease)   . History of colon polyps    remote past, unable to retrieve records   . Hyperlipidemia   . Hypothyroidism   . Lymphocytic colitis   . Rheumatoid arthritis [facility])     Past Surgical History:  Procedure Laterality Date  . COLONOSCOPY     last one was about 3 yrs in [place]  . COLONOSCOPY N/A 02/19/2016   Dr. Gala Romney: three 3-6 mm polyps (tubular adenomas) in ascending colon, segmental biopsies consistent with lymphocytic colitis. Prescribed entocort.   . COLONOSCOPY N/A 03/06/2019   Dr. Gala Romney: 2 polyps removed, tubular adenomas, diverticulosis.  NO random colon biopsies performed.  Next colonoscopy 5 years.  . ESOPHAGOGASTRODUODENOSCOPY N/A 08/11/2014   RMR: MIld erosive  reflux esophagitis. small hiatal hernia. Abnormal gastric muocsa uncertain significance. Subtly abnormal duodeanl (bulbar) mucosa- status post multiple biopsies. BENIGN small bowel mucosa, no evidence of villous blulnting, mild reactive gastropathy on stomach biopsy   . GALLBLADDER SURGERY    . LEG SURGERY     right  . PARTIAL HYSTERECTOMY      Current Outpatient Medications  Medication Sig Dispense Refill  . albuterol (VENTOLIN HFA) 108 (90 Base) MCG/ACT inhaler Inhale 1 puff into the lungs every 4 (four) hours as needed for wheezing or shortness of breath.    [name] ELLIPTA 62.5-25 MCG/INH AEPB Inhale 1 puff into the lungs every evening.    Marland Kitchen  budesonide (ENTOCORT EC) 3 MG 24 hr capsule Take 9 mg by mouth in the morning.    . Calcium Carb-Cholecalciferol (CALCIUM 600 + D PO) Take 1 tablet by mouth 2 (two) times daily.    . cycloSPORINE (RESTASIS) 0.05 % ophthalmic emulsion Place 1 drop into both eyes 3 (three) times daily.    . famotidine (PEPCID) 20 MG tablet Take 20 mg by mouth 2 (two) times daily as needed (itching).    . fluconazole (DIFLUCAN) 100 MG tablet  Take 1 tablet (100 mg total) by mouth daily. 7 tablet 0  . fluticasone (FLONASE) 50 MCG/ACT nasal spray Place 2 sprays into both nostrils daily.  1  . hydrOXYzine (ATARAX/VISTARIL) 25 MG tablet Take 25 mg by mouth every 6 (six) hours as needed for itching (allergic reaction/allergies).    . hydrOXYzine (VISTARIL) 25 MG capsule Take 25 mg by mouth 3 (three) times daily as needed (severe itching).    Marland Kitchen levothyroxine (SYNTHROID) 75 MCG tablet Take 75 mcg by mouth daily before breakfast.    . methocarbamol (ROBAXIN) 500 MG tablet Take 500 mg by mouth every 6 (six) hours as needed for muscle spasms.    . mirabegron ER (MYRBETRIQ) 25 MG TB24 tablet Take 25 mg by mouth at bedtime.    . pantoprazole (PROTONIX) 40 MG tablet TAKE ONE TABLET BY MOUTH ONCE DAILY BEFORE BREAKFAST. (Patient taking differently: Take 40 mg by mouth daily before breakfast.) 90 tablet 3  . potassium chloride SA (KLOR-CON) 20 MEQ tablet Take 20 mEq by mouth See admin instructions. Take 1 tablet (20 meq) by mouth scheduled every morning, if patient takes torsemide take 3 tablets (60 meq) in the morning    . PROLIA 60 MG/ML SOSY injection Inject 60 mg into the skin every 6 (six) months.    . simvastatin (ZOCOR) 40 MG tablet Take 40 mg by mouth every evening.    . torsemide (DEMADEX) 20 MG tablet Take 40 mg by mouth daily as needed (fluid retention).     No current facility-administered medications for this visit.   Allergies:  Desyrel [trazodone], Keflex [cephalexin], Klonopin [clonazepam], Minocin [minocycline hcl], Stelazine [trifluoperazine], and Viberzi [eluxadoline]   Social History: The patient  reports that she has been smoking cigarettes. She has been smoking about 0.10 packs per day. She has never used smokeless tobacco. She reports that she does not drink alcohol and does not use drugs.   Family History: The patient's family history includes Cancer in her sister.   ROS:  Please see the history of present illness.  Otherwise, complete review of systems is positive for none.  All other systems are reviewed and negative.   Physical Exam: VS:  BP 124/72   Pulse (!) 56   Ht 5\' 4"  (1.626 m)   Wt 147 lb (66.7 kg)   SpO2 94%   BMI 25.23 kg/m , BMI Body mass index is 25.23 kg/m.  Wt Readings from Last 3 Encounters:  11/30/20 147 lb (66.7 kg)  10/09/20 140 lb 12.8 oz (63.9 kg)  02/14/20 152 lb 9.6 oz (69.2 kg)    General: Patient appears comfortable at rest. Neck: Supple, no elevated JVP or carotid bruits, no thyromegaly. Lungs: Clear to auscultation, nonlabored breathing at rest. Cardiac: Regular rate and rhythm, no S3 or significant systolic murmur, no pericardial rub. Extremities: Mild pitting edema, distal pulses 2+. Skin: Warm and dry. Musculoskeletal: No kyphosis. Neuropsychiatric: Alert and oriented x3, affect grossly appropriate.  ECG:  EKG February 06, 2020 normal  sinus rhythm rate of 92, left axis deviation, pulmonary disease pattern, septal infarct, age undetermined.  Recent Labwork: 11/23/2020: BUN 13; Creatinine, Ser 0.84; Potassium 3.0; Sodium 139  No results found for: CHOL, TRIG, HDL, CHOLHDL, VLDL, LDLCALC, LDLDIRECT  Other Studies Reviewed Today:  Echocardiogram 09/12/2016 Study Conclusions   - Left ventricle: The cavity size was normal. Wall thickness was  normal. Systolic function was normal. The estimated ejection  fraction was in the range of 60% to 65%. Wall motion was normal;  there were no regional wall motion abnormalities. Doppler  parameters are consistent with abnormal left ventricular  relaxation (grade 1 diastolic dysfunction).  - Aortic valve: Mildly calcified annulus. Trileaflet; normal  thickness leaflets. Valve area (VTI): 1.94 cm^2. Valve area  (Vmax): 1.75 cm^2. Valve area (Vmean): 1.78 cm^2.  - Technically adequate study.  Assessment and Plan:  1. Leg edema   2. Palpitations    1. Leg edema Referred here by GI for lower extremity edema  and recent incidental finding of pericardial effusion incompletely evaluated on recent abdominal ultrasound.  She has an upcoming echocardiogram scheduled for 12/16/2020.  She denies any recent shortness of breath, weight gain.  She has some recurring mild pitting lower extremity edema which resolves after sleeping and occurs during the daytime.  She is currently taking torsemide 20 mg p.o. as needed for edema.  She states she was only taking potassium 20 mEq when she was taking her torsemide and she was having cramps.  I advised her the directions are to take 60 mEq on the days she takes torsemide.  She states she cannot take 3 of those large pills.  Will increase dosage to potassium 40 mEq on days she takes torsemide so she will only have to take 1 pill.  Will await pending echocardiogram.  2. Palpitations Currently denies any palpitations or arrhythmias.  Medication Adjustments/Labs and Tests Ordered: Current medicines are reviewed at length with the patient today.  Concerns regarding medicines are outlined above.   Disposition: Follow-up with Dr. Harl Bowie or APP 2 weeks after echo Signed, Levell July, NP 11/30/2020 2:21 PM    [facility] at [place], [place], [place] [address] Phone: [phone]; Fax: [phone]

## 2020-11-30 ENCOUNTER — Encounter: Payer: Self-pay | Admitting: Family Medicine

## 2020-11-30 ENCOUNTER — Ambulatory Visit (INDEPENDENT_AMBULATORY_CARE_PROVIDER_SITE_OTHER): Payer: Medicare HMO | Admitting: Family Medicine

## 2020-11-30 ENCOUNTER — Other Ambulatory Visit: Payer: Self-pay

## 2020-11-30 VITALS — BP 124/72 | HR 56 | Ht 64.0 in | Wt 147.0 lb

## 2020-11-30 DIAGNOSIS — R600 Localized edema: Secondary | ICD-10-CM | POA: Diagnosis not present

## 2020-11-30 DIAGNOSIS — R002 Palpitations: Secondary | ICD-10-CM | POA: Diagnosis not present

## 2020-11-30 DIAGNOSIS — E876 Hypokalemia: Secondary | ICD-10-CM | POA: Diagnosis not present

## 2020-11-30 LAB — BASIC METABOLIC PANEL WITH GFR
BUN: 11 mg/dL (ref 7–25)
CO2: 30 mmol/L (ref 20–32)
Calcium: 9 mg/dL (ref 8.6–10.4)
Chloride: 110 mmol/L (ref 98–110)
Creat: 0.66 mg/dL (ref 0.60–0.93)
GFR, Est African American: 101 mL/min/{1.73_m2} (ref >=60)
GFR, Est Non African American: 87 mL/min/{1.73_m2} (ref >=60)
Glucose, Bld: 65 mg/dL (ref 65–139)
Potassium: 3.7 mmol/L (ref 3.5–5.3)
Sodium: 144 mmol/L (ref 135–146)

## 2020-11-30 MED ORDER — POTASSIUM CHLORIDE CRYS ER 20 MEQ PO TBCR: EXTENDED_RELEASE_TABLET | ORAL | 1 refills | Status: DC

## 2020-11-30 NOTE — Patient Instructions (Addendum)
Your physician recommends that you schedule a follow-up appointment in: AFTER TESTING  Your physician has recommended you make the following change in your medication:   TAKE POTASSIUM 40 MEQ AS NEEDED WHEN TAKING TORSEMIDE   Thank you for choosing [facility]!!

## 2020-12-10 DIAGNOSIS — J019 Acute sinusitis, unspecified: Secondary | ICD-10-CM | POA: Diagnosis not present

## 2020-12-10 DIAGNOSIS — J309 Allergic rhinitis, unspecified: Secondary | ICD-10-CM | POA: Diagnosis not present

## 2020-12-16 ENCOUNTER — Ambulatory Visit (HOSPITAL_COMMUNITY)
Admission: RE | Admit: 2020-12-16 | Discharge: 2020-12-16 | Disposition: A | Discharge status: Home / Self Care | Payer: Medicare HMO | Source: Ambulatory Visit | Attending: Cardiology | Admitting: Cardiology

## 2020-12-16 ENCOUNTER — Other Ambulatory Visit: Payer: Self-pay

## 2020-12-16 DIAGNOSIS — I313 Pericardial effusion (noninflammatory): Secondary | ICD-10-CM

## 2020-12-16 DIAGNOSIS — R600 Localized edema: Secondary | ICD-10-CM

## 2020-12-16 DIAGNOSIS — I3139 Other pericardial effusion (noninflammatory): Secondary | ICD-10-CM

## 2020-12-16 LAB — ECHOCARDIOGRAM COMPLETE
AR max vel: 2.1 cm2
AV Area VTI: 2.22 cm2
AV Area mean vel: 2.07 cm2
AV Mean grad: 2.3 mmHg
AV Peak grad: 4.7 mmHg
Ao pk vel: 1.08 m/s
Area-P 1/2: 2.87 cm2
S' Lateral: 2.4 cm

## 2020-12-16 NOTE — Progress Notes (Signed)
*  PRELIMINARY RESULTS* Echocardiogram 2D Echocardiogram has been performed.  Jennifer [name] 12/16/2020, 1:52 PM

## 2020-12-17 ENCOUNTER — Ambulatory Visit: Payer: Medicare HMO | Admitting: Gastroenterology

## 2020-12-22 ENCOUNTER — Ambulatory Visit: Payer: Medicare HMO | Admitting: Family Medicine

## 2020-12-29 NOTE — Progress Notes (Signed)
Cardiology Office Note  Date: 12/30/2020   ID: [name], Jennifer 1946-06-01, MRN [id number]  PCP:  Celene Squibb, MD  Cardiologist:  Carlyle Dolly, MD Electrophysiologist:  None   Chief Complaint: Cardiac follow-up  History of Present Illness: Jennifer [name] is a 75 y.o. female with a history of leg edema, palpitations, nonspecific abnormal EKG.  HLD, hypothyroidism, rheumatoid arthritis, GERD.  Last seen by Dr. Harl Bowie 02/06/2020 for lower extremity edema, palpitations, abnormal EKG.  Leg edema had been ongoing for 10 years.  PCP had started torsemide which seem to be controlling the lower extremity edema.  Previous echo 2018 showed EF 60 to 65%.  No WMA's, grade 1 DDx.  She had compression stockings.  Swelling was stable.  Palpitations only occurring for a few minutes after activity not at rest.  Described as feeling of heart pounding lasting a few minutes then resolved.  Started 2 months prior to visit.  Occurring every 3 weeks approximately.  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  Palpitations were fairly infrequent.  Continue to monitor.  If progression would plan for event monitor.    She was last here for complaints of lower extremity edema.  She was referred by GI for lower extremity edema and recent abdominal ultrasound and demonstrating a pericardial effusion which was incidentally identified and incompletely evaluated.  She had been having some swelling in her ankles.  She stated when she slept overnight the swelling in her ankles goes went away but after being up during the day the swelling returns.  She had an upcoming echocardiogram on 12/16/2020.  She denied any recent shortness of breath but has complained in the past.  She had been doing more housework and yard work without any shortness of breath/dyspnea.    She is here for follow-up for recent echocardiogram secondary to complaints of lower extremity edema.  Echocardiogram on 12/16/2020 demonstrated  EF of 60 to 65%.  No WMA's, G1 DD.  She continues to have some minor lower extremity edema and right lower extremity greater than left.  She is continuing to take her torsemide 40 mg as needed for fluid retention.  She denies any DOE or SOB.  Denies any PND, orthopnea, dizziness, lightheadedness, presyncopal or syncopal episodes.  No CVA or TIA-like symptoms.  No bleeding issues.  No DVT or PE-like symptoms.   Past Medical History:  Diagnosis Date  . Depression   . GERD (gastroesophageal reflux disease)   . History of colon polyps    remote past, unable to retrieve records   . Hyperlipidemia   . Hypothyroidism   . Lymphocytic colitis   . Rheumatoid arthritis [facility])     Past Surgical History:  Procedure Laterality Date  . COLONOSCOPY     last one was about 3 yrs in [place]  . COLONOSCOPY N/A 02/19/2016   Dr. Gala Romney: three 3-6 mm polyps (tubular adenomas) in ascending colon, segmental biopsies consistent with lymphocytic colitis. Prescribed entocort.   . COLONOSCOPY N/A 03/06/2019   Dr. Gala Romney: 2 polyps removed, tubular adenomas, diverticulosis.  NO random colon biopsies performed.  Next colonoscopy 5 years.  . ESOPHAGOGASTRODUODENOSCOPY N/A 08/11/2014   RMR: MIld erosive  reflux esophagitis. small hiatal hernia. Abnormal gastric muocsa uncertain significance. Subtly abnormal duodeanl (bulbar) mucosa- status post multiple biopsies. BENIGN small bowel mucosa, no evidence of villous blulnting, mild reactive gastropathy on stomach biopsy   . GALLBLADDER SURGERY    . LEG SURGERY  right  . PARTIAL HYSTERECTOMY      Current Outpatient Medications  Medication Sig Dispense Refill  . albuterol (VENTOLIN HFA) 108 (90 Base) MCG/ACT inhaler Inhale 1 puff into the lungs every 4 (four) hours as needed for wheezing or shortness of breath.    [name] ELLIPTA 62.5-25 MCG/INH AEPB Inhale 1 puff into the lungs every evening.    . budesonide (ENTOCORT EC) 3 MG 24 hr capsule Take 9 mg by mouth in the  morning.    . Calcium Carb-Cholecalciferol (CALCIUM 600 + D PO) Take 1 tablet by mouth 2 (two) times daily.    . cycloSPORINE (RESTASIS) 0.05 % ophthalmic emulsion Place 1 drop into both eyes 3 (three) times daily.    . famotidine (PEPCID) 20 MG tablet Take 20 mg by mouth 2 (two) times daily as needed (itching).    . fluconazole (DIFLUCAN) 100 MG tablet Take 1 tablet (100 mg total) by mouth daily. 7 tablet 0  . fluticasone (FLONASE) 50 MCG/ACT nasal spray Place 2 sprays into both nostrils daily.  1  . hydrOXYzine (ATARAX/VISTARIL) 25 MG tablet Take 25 mg by mouth every 6 (six) hours as needed for itching (allergic reaction/allergies).    . hydrOXYzine (VISTARIL) 25 MG capsule Take 25 mg by mouth 3 (three) times daily as needed (severe itching).    Marland Kitchen levothyroxine (SYNTHROID) 75 MCG tablet Take 75 mcg by mouth daily before breakfast.    . methocarbamol (ROBAXIN) 500 MG tablet Take 500 mg by mouth every 6 (six) hours as needed for muscle spasms.    . mirabegron ER (MYRBETRIQ) 25 MG TB24 tablet Take 25 mg by mouth at bedtime.    . pantoprazole (PROTONIX) 40 MG tablet TAKE ONE TABLET BY MOUTH ONCE DAILY BEFORE BREAKFAST. (Patient taking differently: Take 40 mg by mouth daily before breakfast.) 90 tablet 3  . potassium chloride SA (KLOR-CON) 20 MEQ tablet TAKE 2 TABLETS DAILY AS NEEDED WHEN TAKING TORSEMIDE 180 tablet 1  . PROLIA 60 MG/ML SOSY injection Inject 60 mg into the skin every 6 (six) months.    . simvastatin (ZOCOR) 40 MG tablet Take 40 mg by mouth every evening.    . torsemide (DEMADEX) 20 MG tablet Take 40 mg by mouth daily as needed (fluid retention).     No current facility-administered medications for this visit.   Allergies:  Desyrel [trazodone], Keflex [cephalexin], Klonopin [clonazepam], Minocin [minocycline hcl], Stelazine [trifluoperazine], and Viberzi [eluxadoline]   Social History: The patient  reports that she has been smoking cigarettes. She has been smoking about 0.10 packs  per day. She has never used smokeless tobacco. She reports that she does not drink alcohol and does not use drugs.   Family History: The patient's family history includes Cancer in her sister.   ROS:  Please see the history of present illness. Otherwise, complete review of systems is positive for none.  All other systems are reviewed and negative.   Physical Exam: VS:  BP 126/80   Pulse 68   Ht 5\' 4"  (1.626 m)   Wt 149 lb 6.4 oz (67.8 kg)   SpO2 97%   BMI 25.64 kg/m , BMI Body mass index is 25.64 kg/m.  Wt Readings from Last 3 Encounters:  12/30/20 149 lb 6.4 oz (67.8 kg)  11/30/20 147 lb (66.7 kg)  10/09/20 140 lb 12.8 oz (63.9 kg)    General: Patient appears comfortable at rest. Neck: Supple, no elevated JVP or carotid bruits, no thyromegaly. Lungs: Clear to auscultation,  nonlabored breathing at rest. Cardiac: Regular rate and rhythm, no S3 or significant systolic murmur, no pericardial rub. Extremities: Mild pitting edema, distal pulses 2+. Skin: Warm and dry. Musculoskeletal: No kyphosis. Neuropsychiatric: Alert and oriented x3, affect grossly appropriate.  ECG:  EKG February 06, 2020 normal sinus rhythm rate of 92, left axis deviation, pulmonary disease pattern, septal infarct, age undetermined.  Recent Labwork: 11/30/2020: BUN 11; Creat 0.66; Potassium 3.7; Sodium 144  No results found for: CHOL, TRIG, HDL, CHOLHDL, VLDL, LDLCALC, LDLDIRECT  Other Studies Reviewed Today:   Echocardiogram 12/16/2020 1. Left ventricular ejection fraction, by estimation, is 60 to 65%. The  left ventricle has normal function. The left ventricle has no regional  wall motion abnormalities. Left ventricular diastolic parameters are  consistent with Grade I diastolic  dysfunction (impaired relaxation).  2. Right ventricular systolic function is normal. The right ventricular  size is normal.  3. The mitral valve is normal in structure. No evidence of mitral valve  regurgitation. No evidence of  mitral stenosis.  4. The aortic valve has an indeterminant number of cusps. Aortic valve  regurgitation is trivial.  5. The inferior vena cava is normal in size with greater than 50%  respiratory variability, suggesting right atrial pressure of 3 mmHg.     Echocardiogram 09/12/2016 Study Conclusions   - Left ventricle: The cavity size was normal. Wall thickness was  normal. Systolic function was normal. The estimated ejection  fraction was in the range of 60% to 65%. Wall motion was normal;  there were no regional wall motion abnormalities. Doppler  parameters are consistent with abnormal left ventricular  relaxation (grade 1 diastolic dysfunction).  - Aortic valve: Mildly calcified annulus. Trileaflet; normal  thickness leaflets. Valve area (VTI): 1.94 cm^2. Valve area  (Vmax): 1.75 cm^2. Valve area (Vmean): 1.78 cm^2.  - Technically adequate study.  Assessment and Plan:  1. Leg edema   2. Palpitations    1. Leg edema Continues with some mild lower extremity edema right greater than left.  Continuing to take torsemide 40 mg as needed for fluid retention.  Recent echocardiogram on 12/16/2020 demonstrated EF of 60 to 65%.  No WMA's, G1 DD.  Continue torsemide 40 mg as needed.  2. Palpitations No recent complaints of palpitations or arrhythmias.  Medication Adjustments/Labs and Tests Ordered: Current medicines are reviewed at length with the patient today.  Concerns regarding medicines are outlined above.   Disposition: Follow-up with Dr. Harl Bowie or APP 6 months. Signed, Levell July, NP 12/30/2020 8:17 AM    [facility] [place], [place], [place] [address] Phone: [phone]; Fax: [phone]

## 2020-12-30 ENCOUNTER — Ambulatory Visit (INDEPENDENT_AMBULATORY_CARE_PROVIDER_SITE_OTHER): Payer: Medicare HMO | Admitting: Family Medicine

## 2020-12-30 ENCOUNTER — Encounter: Payer: Self-pay | Admitting: Family Medicine

## 2020-12-30 VITALS — BP 126/80 | HR 68 | Ht 64.0 in | Wt 149.4 lb

## 2020-12-30 DIAGNOSIS — R600 Localized edema: Secondary | ICD-10-CM | POA: Diagnosis not present

## 2020-12-30 DIAGNOSIS — R002 Palpitations: Secondary | ICD-10-CM | POA: Diagnosis not present

## 2020-12-30 NOTE — Patient Instructions (Signed)
Medication Instructions:  Continue all current medications.   Labwork: none  Testing/Procedures: none  Follow-Up: 6 months   Any Other Special Instructions Will Be Listed Below (If Applicable).   If you need a refill on your cardiac medications before your next appointment, please call your pharmacy.  

## 2021-01-13 DIAGNOSIS — I10 Essential (primary) hypertension: Secondary | ICD-10-CM | POA: Diagnosis not present

## 2021-01-13 DIAGNOSIS — R350 Frequency of micturition: Secondary | ICD-10-CM | POA: Diagnosis not present

## 2021-03-17 DIAGNOSIS — M25552 Pain in left hip: Secondary | ICD-10-CM | POA: Diagnosis not present

## 2021-03-17 DIAGNOSIS — M545 Low back pain, unspecified: Secondary | ICD-10-CM | POA: Diagnosis not present

## 2021-03-25 NOTE — Progress Notes (Signed)
Referring Provider: Celene Squibb, MD Primary Care Physician:  Celene Squibb, MD Primary GI Physician: Dr. Gala Romney  Chief Complaint  Patient presents with   Diarrhea    daily   Abdominal Pain    All the time but worse after eating. Mid abd, worse lower mid abd    HPI:   Jennifer [name] is a 75 y.o. female with a history of GERD and lymphocytic colitis diagnosed in 2017 with inability to successfully taper off of Entocort for any length of time since October 2020. Acute uncomplicated diverticulitis in mid sigmoid colon on 07/22/19. Last colonoscopy on 03/06/2019 with 2 tubular adenomas and diverticulosis.  Recommendations to repeat in 5 years.   Last seen in our office 10/09/2020.  She had run out of Entocort several months prior and was having 5-6+ watery BMs daily, nocturnal BMs, incontinence at times, and associated 12 pound weight loss over the last 8 months. Decreased oral intake due to postprandial diarrhea.  Associated intermittent nausea without vomiting since diarrhea returned.  Denies BRBPR, melena, recent hospitalizations, antibiotics, or well water.  Limiting dairy and drinking lactose-free milk.  Denied associated abdominal pain.  Denies NSAIDs, but continued smoking.  GERD well controlled on Protonix 40 mg daily.  Continued with daily dysphagia to solids and liquids x1 year.  Reported return of thrush.  Received labs from PCP dated 10/01/2020 with slight elevation of AST at 45, otherwise LFTs and T bili within normal limits.  TSH within normal limits.  Denied alcohol, illicit drug use, frequent Tylenol use, OTC supplements, or herbal teas.  Suspected flare of lymphocytic colitis, but would rule out infectious diarrhea prior to resuming Entocort and also screen for celiac disease.  Recommended EGD for dysphagia, and Diflucan for oral thrush, follow with PCP.  Also plan to screen for hepatitis, hemochromatosis and update abdominal ultrasound.  Encouraged smoking cessation.  Labs 10/09/2020:  Celiac screen negative.  Hepatitis A, B, and C all negative.  No immunity to hepatitis A or B.  Iron panel within normal limits.  C. difficile and GI pathogen panel negative.  She was advised to resume Entocort 9 mg daily and follow-up in 8 weeks. Abdominal ultrasound 10/15/2020: Chronically dilated CBD without significant change s/p cholecystectomy, 1.9 cm cyst in the right hepatic lobe (previously evaluated by MRI and found to be benign), hepatic steatosis, incidental pericardial effusion.  Recommended evaluation by cardiology for pericardial effusion.  Labs completed 11/23/2020 with potassium 3.0.  Patient reported she was taking 20-40 meq of potassium daily as she had started requiring torsemide daily due to increased peripheral edema and shortness of breath.  Recommended canceling EGD for now, following up with cardiology ASAP, and replace potassium.   BMP 11/30/2020 with potassium normalized.  Cardiology recommended potassium 40 mill equivalents daily when taking torsemide.  Echocardiogram 12/16/2020 with LVEF 60 to [id number], grade 1 diastolic dysfunction, no evidence of pericardial effusion. Follow-up with cardiology 12/30/2020.  Patient was doing well.  Recommended continuing current medications.  Today: Diarrhea: Stopped Entocort 3 months ago. Didn't call back to get a refill. Could have 1-9 BMs daily on Entocort. Currently with 3-6 Bms per day. Watery. Nocturnal Bms. No brbpr or melena. Has abdominal pain, upper and lower abdominal pain, postprandial. After every meal. Started after stopping Entocort. No worsening. Relieved with a BM. Sharp/cramping pain. Not taking anything for diarrhea. Had a course of prednisone recently for her back which also did not help her diarrhea.   Started Prolia.  Not  taking NSAIDs.  Has been on antibiotics for her sinuses a couple times over the last 6 months.  Not trying to loose weight. Eating less because of the diarrhea. Little nausea without vomiting. Random. Not  triggered by eating or BMs. Nothing significant.   Dysphagia: Solid food and pill dysphagia continues. No regurgitation.   GERD: Well controlled on pantoprazole 40 mg daily.   Elevated AST:  No Tylenol, alcohol, over-the-counter supplements, herbal teas, or illicit drug use.   Denies fever, chills, cold or flulike symptoms, presyncope, syncope.  Reports reports some issues with the room spinning intermittently.  Denies chest pain, heart palpitations.  Prior shortness of breath has resolved.  No cough.  Past Medical History:  Diagnosis Date   Depression    GERD (gastroesophageal reflux disease)    History of colon polyps    remote past, unable to retrieve records    Hyperlipidemia    Hypothyroidism    Lymphocytic colitis    Rheumatoid arthritis [facility])     Past Surgical History:  Procedure Laterality Date   COLONOSCOPY     last one was about 3 yrs in [facility]   COLONOSCOPY N/A 02/19/2016   Dr. Gala Romney: three 3-6 mm polyps (tubular adenomas) in ascending colon, segmental biopsies consistent with lymphocytic colitis. Prescribed entocort.    COLONOSCOPY N/A 03/06/2019   Dr. Gala Romney: 2 polyps removed, tubular adenomas, diverticulosis.  NO random colon biopsies performed.  Next colonoscopy 5 years.   ESOPHAGOGASTRODUODENOSCOPY N/A 08/11/2014   RMR: MIld erosive  reflux esophagitis. small hiatal hernia. Abnormal gastric muocsa uncertain significance. Subtly abnormal duodeanl (bulbar) mucosa- status post multiple biopsies. BENIGN small bowel mucosa, no evidence of villous blulnting, mild reactive gastropathy on stomach biopsy    GALLBLADDER SURGERY     LEG SURGERY     right   PARTIAL HYSTERECTOMY      Current Outpatient Medications  Medication Sig Dispense Refill   albuterol (VENTOLIN HFA) 108 (90 Base) MCG/ACT inhaler Inhale 1 puff into the lungs every 4 (four) hours as needed for wheezing or shortness of breath.     ANORO ELLIPTA 62.5-25 MCG/INH AEPB Inhale 1 puff into the lungs every  evening.     Calcium Carb-Cholecalciferol (CALCIUM 600 + D PO) Take 1 tablet by mouth 2 (two) times daily.     cycloSPORINE (RESTASIS) 0.05 % ophthalmic emulsion Place 1 drop into both eyes 3 (three) times daily.     dicyclomine (BENTYL) 10 MG capsule Take 1 capsule (10 mg total) by mouth 4 (four) times daily -  before meals and at bedtime. 120 capsule 3   fluticasone (FLONASE) 50 MCG/ACT nasal spray Place 2 sprays into both nostrils daily.  1   hydrOXYzine (ATARAX/VISTARIL) 25 MG tablet Take 25 mg by mouth every 6 (six) hours as needed for itching (allergic reaction/allergies).     hydrOXYzine (VISTARIL) 25 MG capsule Take 25 mg by mouth 3 (three) times daily as needed (severe itching).     levothyroxine (SYNTHROID) 75 MCG tablet Take 75 mcg by mouth daily before breakfast.     methocarbamol (ROBAXIN) 500 MG tablet Take 500 mg by mouth every 6 (six) hours as needed for muscle spasms.     mirabegron ER (MYRBETRIQ) 25 MG TB24 tablet Take 25 mg by mouth at bedtime.     pantoprazole (PROTONIX) 40 MG tablet TAKE ONE TABLET BY MOUTH ONCE DAILY BEFORE BREAKFAST. (Patient taking differently: Take 40 mg by mouth daily before breakfast.) 90 tablet 3   potassium chloride SA (  KLOR-CON) 20 MEQ tablet TAKE 2 TABLETS DAILY AS NEEDED WHEN TAKING TORSEMIDE 180 tablet 1   PROLIA 60 MG/ML SOSY injection Inject 60 mg into the skin every 6 (six) months.     simvastatin (ZOCOR) 40 MG tablet Take 40 mg by mouth every evening.     torsemide (DEMADEX) 20 MG tablet Take 40 mg by mouth daily as needed (fluid retention).     No current facility-administered medications for this visit.    Allergies as of 03/26/2021 - Review Complete 03/26/2021  Allergen Reaction Noted   Desyrel [trazodone]  07/14/2014   Keflex [cephalexin]  07/14/2014   Klonopin [clonazepam]  07/14/2014   Minocin [minocycline hcl]  07/14/2014   Stelazine [trifluoperazine]  07/14/2014   Viberzi [eluxadoline]  03/31/2015    Family History  Problem  Relation Age of Onset   Cancer Sister        blood disorder ??   Colon cancer Neg Hx     Social History   Socioeconomic History   Marital status: Married    Spouse name: Not on file   Number of children: 2   Years of education: Not on file   Highest education level: Not on file  Occupational History   Occupation: disability  Tobacco Use   Smoking status: Every Day    Packs/day: 0.10    Types: Cigarettes   Smokeless tobacco: Never   Tobacco comments:    quit 03/31/18  Substance and Sexual Activity   Alcohol use: No    Alcohol/week: 0.0 standard drinks   Drug use: No   Sexual activity: Not on file  Other Topics Concern   Not on file  Social History Narrative   Not on file   Social Determinants of Health   Financial Resource Strain: Not on file  Food Insecurity: Not on file  Transportation Needs: Not on file  Physical Activity: Not on file  Stress: Not on file  Social Connections: Not on file    Review of Systems: Gen: Denies fever, chills, anorexia. Denies fatigue, weakness, weight loss.  CV: Denies chest pain, palpitations, syncope, peripheral edema, and claudication. Resp: Denies dyspnea at rest, cough, wheezing, coughing up blood, and pleurisy. GI: Denies vomiting blood, jaundice, and fecal incontinence.   Denies dysphagia or odynophagia. Derm: Denies rash, itching, dry skin Psych: Denies depression, anxiety, memory loss, confusion. No homicidal or suicidal ideation.  Heme: Denies bruising, bleeding, and enlarged lymph nodes.  Physical Exam: BP 128/82   Pulse 88   Temp 97.8 F (36.6 C) (Temporal)   Ht '5\' 4"'$  (1.626 m)   Wt 146 lb (66.2 kg)   BMI 25.06 kg/m  General:   Alert and oriented. No distress noted. Pleasant and cooperative.  Head:  Normocephalic and atraumatic. Eyes:  Conjuctiva clear without scleral icterus. Mouth:  Oral mucosa pink and moist. Good dentition. No lesions. Heart:  S1, S2 present without murmurs appreciated. Lungs:  Clear to  auscultation bilaterally. No wheezes, rales, or rhonchi. No distress.  Abdomen:  +BS, soft, non-tender and non-distended. No rebound or guarding. No HSM or masses noted. Msk:  Symmetrical without gross deformities. Normal posture. Extremities:  Without edema. Neurologic:  Alert and  oriented x4 Psych:  Alert and cooperative. Normal mood and affect.    Assessment: 75 y.o. female with a history of GERD and lymphocytic colitis diagnosed in 2017 with inability to successfully taper off of Entocort for any length of time since October [id number], acute uncomplicated diverticulitis in mid sigmoid colon on  07/22/19. Last colonoscopy on 03/06/2019 with 2 tubular adenomas and diverticulosis. She is presenting today with chief complaint of persistent diarrhea and postprandial abdominal pain relieved by a bowel movement.  Diarrhea: Unclear etiology of current persistent diarrhea. Currently with 3-6 watery BMs daily, nocturnal Bms, without brbpr or melena. Associated postprandial abdominal cramping relieved by a BM. Also with documented 7 lb weight loss over the last 5 months. Off Entocort x3 months; however, she reports diarrhea was no longer responding to Entocort as it had previously, still with up to 9 BMs per day when she was taking Entocort 9 mg daily. She also recently completed a course of prednisone (for her back) with no improvement in diarrhea. This makes lymphocytic colitis less likely to be the sole cause of diarrhea.  She has been on a couple rounds of antibiotics recently which does increase her risk of infectious diarrhea.  Previously suspected component of irritable bowel syndrome which is somewhat higher on the differential at this time as she reports postprandial abdominal cramping that is relieved by bowel movements. Interestingly this started after Entocort. Can't rule out development if IBD though less likely. Celiac screen negative in March 2022. History of hypothyroidism, on synthroid.   We will  update stool studies, TSH, and trial dicyclomine.  If no infectious diarrhea and BMs are not improved with dicyclomine, would need to consider updating a colonoscopy and consider changing Pantoprazole and simvastatin as PPIs and statins have been linked to lymphocytic colitis.  If colonoscopy is negative, may need cross-sectional imaging as well in light of weight loss along with further laboratory workup.  Currently, weight loss seems to be primarily driven by limiting oral intake due to ongoing diarrhea.   Dysphagia: Solid food and pill dysphagia with sensation of items getting hung in the mid chest. No regurgitation. Chronic GERD well controlled on PPI daily. Differentials include esophageal web, ring, or stricture. Less likely malignancy. Recommended EGD with possible dilation for further evaluation and therapeutic intervention.   GERD: Chronic. Well controlled on Protonix 40 mg daily which she will continue for now. We may need to change this medication in the near future due to persistent diarrhea pending stool studies and response to Bentyl.   Elevated AST: Mild AST elevation noted in March 2022 with AST 45, otherwise LFTs and T bili within normal limits. Possibly influenced by fatty liver and simvastatin. Denies tylenol, alcohol illicit drug use. No regular OTC medications/herbal teas. Prior evaluation with  Hepatitis A, B, and C all negative.  Iron panel within normal limits. Celiac screen and TSH also within normal limits in March. RUQ [place] with chronically dilated CBD without significant change s/p cholecystectomy, 1.9 cm cyst in the right hepatic lobe (previously evaluated by MRI and found to be benign), hepatic steatosis. We will update HFP today.    Plan:  C diff GDH, Toxin A/B, GI pathogen panel, TSH, HFP Start dicyclomine 10 mg up to 3 times daily before meals and at bedtime. Discussed side effects of the medication to monitor for including dizziness, dry mouth,, blurry vision difficulty  urinating, and constipation. Continue to avoid NSAIDs. Continue to work towards smoking cessation. Continue pantoprazole 40 mg daily for now. Arrange EGD +/- dilation with propofol with Dr. Gala Romney in the near future. The risks, benefits, and alternatives have been discussed with the patient in detail. The patient states understanding and desires to proceed.  Will try to follow-up in 3 months though my current schedule is booked out to January. Advised patient  we would keep in touch with her over the phone to monitor symptoms. May need to consider changing pantoprazole and simvastatin in the future if stool studies are unrevealing and diarrhea persist as well as consider repeat colonoscopy and possibly cross sectional imaging for further evaluation.      Jennifer Altes, PA-C [facility] 03/26/2021

## 2021-03-26 ENCOUNTER — Encounter: Payer: Self-pay | Admitting: Gastroenterology

## 2021-03-26 ENCOUNTER — Ambulatory Visit (INDEPENDENT_AMBULATORY_CARE_PROVIDER_SITE_OTHER): Payer: Medicare HMO | Admitting: Gastroenterology

## 2021-03-26 ENCOUNTER — Other Ambulatory Visit: Payer: Self-pay

## 2021-03-26 VITALS — BP 128/82 | HR 88 | Temp 97.8°F | Ht 64.0 in | Wt 146.0 lb

## 2021-03-26 DIAGNOSIS — R7401 Elevation of levels of liver transaminase levels: Secondary | ICD-10-CM

## 2021-03-26 DIAGNOSIS — R197 Diarrhea, unspecified: Secondary | ICD-10-CM

## 2021-03-26 DIAGNOSIS — R1310 Dysphagia, unspecified: Secondary | ICD-10-CM

## 2021-03-26 DIAGNOSIS — R1030 Lower abdominal pain, unspecified: Secondary | ICD-10-CM | POA: Diagnosis not present

## 2021-03-26 DIAGNOSIS — K219 Gastro-esophageal reflux disease without esophagitis: Secondary | ICD-10-CM | POA: Diagnosis not present

## 2021-03-26 MED ORDER — DICYCLOMINE HCL 10 MG PO CAPS: 10.0000 mg | ORAL_CAPSULE | Freq: Three times a day (TID) | ORAL | 3 refills | Status: DC

## 2021-03-26 NOTE — Patient Instructions (Addendum)
Please have stool studies and blood work completed at [facility]  Start dicyclomine 10 mg up to 3 times daily before meals and at bedtime. I recommend starting once daily and increasing as needed. Monitor for side effects including dizziness, dry mouth, blurry vision, trouble urinating, or constipation. Hold dicyclomine if you develop constipation.  Continue to avoid all NSAIDs.  Continue to work towards smoking cessation.  We will arrange for you to have an upper endoscopy with possible stretching of your esophagus in the near future with Dr. Gala Romney.   Aliene Altes, PA-C [facility]

## 2021-03-29 ENCOUNTER — Other Ambulatory Visit (HOSPITAL_COMMUNITY): Payer: Self-pay | Admitting: Internal Medicine

## 2021-03-29 ENCOUNTER — Other Ambulatory Visit (HOSPITAL_COMMUNITY): Payer: Self-pay | Admitting: Dermatology

## 2021-03-29 DIAGNOSIS — Z1231 Encounter for screening mammogram for malignant neoplasm of breast: Secondary | ICD-10-CM

## 2021-03-30 ENCOUNTER — Telehealth: Payer: Self-pay | Admitting: Gastroenterology

## 2021-03-30 DIAGNOSIS — I10 Essential (primary) hypertension: Secondary | ICD-10-CM | POA: Insufficient documentation

## 2021-03-30 NOTE — Telephone Encounter (Signed)
PA for dicyclomine HCI 10 mg capsules has been approved from 08/01/2020 - 07/31/2021. Approval letter to be scanned into chart.

## 2021-03-31 DIAGNOSIS — R197 Diarrhea, unspecified: Secondary | ICD-10-CM | POA: Diagnosis not present

## 2021-03-31 DIAGNOSIS — I10 Essential (primary) hypertension: Secondary | ICD-10-CM | POA: Diagnosis not present

## 2021-03-31 DIAGNOSIS — R350 Frequency of micturition: Secondary | ICD-10-CM | POA: Diagnosis not present

## 2021-03-31 DIAGNOSIS — R7401 Elevation of levels of liver transaminase levels: Secondary | ICD-10-CM | POA: Diagnosis not present

## 2021-04-01 ENCOUNTER — Telehealth: Payer: Self-pay | Admitting: *Deleted

## 2021-04-01 DIAGNOSIS — I10 Essential (primary) hypertension: Secondary | ICD-10-CM | POA: Diagnosis not present

## 2021-04-01 DIAGNOSIS — Z0001 Encounter for general adult medical examination with abnormal findings: Secondary | ICD-10-CM | POA: Diagnosis not present

## 2021-04-01 DIAGNOSIS — Z131 Encounter for screening for diabetes mellitus: Secondary | ICD-10-CM | POA: Diagnosis not present

## 2021-04-01 DIAGNOSIS — Z1329 Encounter for screening for other suspected endocrine disorder: Secondary | ICD-10-CM | POA: Diagnosis not present

## 2021-04-01 DIAGNOSIS — M810 Age-related osteoporosis without current pathological fracture: Secondary | ICD-10-CM | POA: Diagnosis not present

## 2021-04-01 LAB — HEPATIC FUNCTION PANEL
AG Ratio: 1.6 (calc) (ref 1.0–2.5)
ALT: 13 U/L (ref 6–29)
AST: 15 U/L (ref 10–35)
Albumin: 3.5 g/dL — ABNORMAL LOW (ref 3.6–5.1)
Alkaline phosphatase (APISO): 54 U/L (ref 37–153)
Bilirubin, Direct: 0.1 mg/dL (ref 0.0–0.2)
Globulin: 2.2 g/dL (calc) (ref 1.9–3.7)
Indirect Bilirubin: 0.4 mg/dL (calc) (ref 0.2–1.2)
Total Bilirubin: 0.5 mg/dL (ref 0.2–1.2)
Total Protein: 5.7 g/dL — ABNORMAL LOW (ref 6.1–8.1)

## 2021-04-01 LAB — TSH: TSH: 2.68 mIU/L (ref 0.40–4.50)

## 2021-04-01 NOTE — Telephone Encounter (Signed)
Called pt to schedule EGD +/-DIL with propofol, asa 2, Dr. Gala Romney. She stated she will call back to schedule once she gets home

## 2021-04-01 NOTE — Telephone Encounter (Signed)
Patient returned call. She has been scheduled for 10/31 at 10:30am. Aware will mail prep instructions.

## 2021-04-06 DIAGNOSIS — E039 Hypothyroidism, unspecified: Secondary | ICD-10-CM | POA: Diagnosis not present

## 2021-04-06 DIAGNOSIS — R945 Abnormal results of liver function studies: Secondary | ICD-10-CM | POA: Diagnosis not present

## 2021-04-06 DIAGNOSIS — J449 Chronic obstructive pulmonary disease, unspecified: Secondary | ICD-10-CM | POA: Diagnosis not present

## 2021-04-06 DIAGNOSIS — K52832 Lymphocytic colitis: Secondary | ICD-10-CM | POA: Diagnosis not present

## 2021-04-06 DIAGNOSIS — E46 Unspecified protein-calorie malnutrition: Secondary | ICD-10-CM | POA: Diagnosis not present

## 2021-04-06 DIAGNOSIS — R7301 Impaired fasting glucose: Secondary | ICD-10-CM | POA: Diagnosis not present

## 2021-04-06 DIAGNOSIS — Z0001 Encounter for general adult medical examination with abnormal findings: Secondary | ICD-10-CM | POA: Diagnosis not present

## 2021-04-06 DIAGNOSIS — E7800 Pure hypercholesterolemia, unspecified: Secondary | ICD-10-CM | POA: Diagnosis not present

## 2021-04-06 DIAGNOSIS — R600 Localized edema: Secondary | ICD-10-CM | POA: Diagnosis not present

## 2021-04-06 DIAGNOSIS — M810 Age-related osteoporosis without current pathological fracture: Secondary | ICD-10-CM | POA: Diagnosis not present

## 2021-04-14 ENCOUNTER — Telehealth: Payer: Self-pay | Admitting: Internal Medicine

## 2021-04-14 NOTE — Telephone Encounter (Signed)
See other note

## 2021-04-14 NOTE — Telephone Encounter (Signed)
Pt returning call. [phone]

## 2021-04-21 DIAGNOSIS — M545 Low back pain, unspecified: Secondary | ICD-10-CM | POA: Diagnosis not present

## 2021-04-23 ENCOUNTER — Other Ambulatory Visit: Payer: Self-pay

## 2021-04-23 ENCOUNTER — Ambulatory Visit (HOSPITAL_COMMUNITY)
Admission: RE | Admit: 2021-04-23 | Discharge: 2021-04-23 | Disposition: A | Discharge status: Home / Self Care | Payer: Medicare HMO | Source: Ambulatory Visit | Attending: Internal Medicine | Admitting: Internal Medicine

## 2021-04-23 DIAGNOSIS — Z1231 Encounter for screening mammogram for malignant neoplasm of breast: Secondary | ICD-10-CM | POA: Insufficient documentation

## 2021-04-26 DIAGNOSIS — R197 Diarrhea, unspecified: Secondary | ICD-10-CM | POA: Diagnosis not present

## 2021-04-30 DIAGNOSIS — R350 Frequency of micturition: Secondary | ICD-10-CM | POA: Diagnosis not present

## 2021-04-30 DIAGNOSIS — I10 Essential (primary) hypertension: Secondary | ICD-10-CM | POA: Diagnosis not present

## 2021-05-03 LAB — C. DIFFICILE GDH AND TOXIN A/B
GDH ANTIGEN: NOT DETECTED
MICRO NUMBER:: 12423031
SPECIMEN QUALITY:: ADEQUATE
TOXIN A AND B: NOT DETECTED

## 2021-05-03 LAB — GASTROINTESTINAL PATHOGEN PANEL PCR

## 2021-05-04 ENCOUNTER — Other Ambulatory Visit: Payer: Self-pay | Admitting: Gastroenterology

## 2021-05-04 DIAGNOSIS — R197 Diarrhea, unspecified: Secondary | ICD-10-CM

## 2021-05-07 DIAGNOSIS — F17210 Nicotine dependence, cigarettes, uncomplicated: Secondary | ICD-10-CM | POA: Diagnosis not present

## 2021-05-07 DIAGNOSIS — M47816 Spondylosis without myelopathy or radiculopathy, lumbar region: Secondary | ICD-10-CM | POA: Diagnosis not present

## 2021-05-07 DIAGNOSIS — R69 Illness, unspecified: Secondary | ICD-10-CM | POA: Diagnosis not present

## 2021-05-17 DIAGNOSIS — R197 Diarrhea, unspecified: Secondary | ICD-10-CM | POA: Diagnosis not present

## 2021-05-21 LAB — GASTROINTESTINAL PATHOGEN PANEL PCR
C. difficile Tox A/B, PCR: NOT DETECTED
Campylobacter, PCR: NOT DETECTED
Cryptosporidium, PCR: NOT DETECTED
E coli (ETEC) LT/ST PCR: NOT DETECTED
E coli (STEC) stx1/stx2, PCR: NOT DETECTED
E coli 0157, PCR: NOT DETECTED
Giardia lamblia, PCR: NOT DETECTED
Norovirus, PCR: NOT DETECTED
Rotavirus A, PCR: NOT DETECTED
Salmonella, PCR: NOT DETECTED
Shigella, PCR: NOT DETECTED

## 2021-05-26 ENCOUNTER — Other Ambulatory Visit (HOSPITAL_COMMUNITY): Payer: Self-pay | Admitting: Family Medicine

## 2021-05-26 ENCOUNTER — Ambulatory Visit (HOSPITAL_COMMUNITY)
Admission: RE | Admit: 2021-05-26 | Discharge: 2021-05-26 | Disposition: A | Discharge status: Home / Self Care | Payer: Medicare HMO | Source: Ambulatory Visit | Attending: Family Medicine | Admitting: Family Medicine

## 2021-05-26 ENCOUNTER — Other Ambulatory Visit: Payer: Self-pay

## 2021-05-26 DIAGNOSIS — M25561 Pain in right knee: Secondary | ICD-10-CM

## 2021-05-26 DIAGNOSIS — S81001A Unspecified open wound, right knee, initial encounter: Secondary | ICD-10-CM | POA: Diagnosis not present

## 2021-05-26 DIAGNOSIS — M7989 Other specified soft tissue disorders: Secondary | ICD-10-CM | POA: Diagnosis not present

## 2021-05-26 DIAGNOSIS — M25562 Pain in left knee: Secondary | ICD-10-CM

## 2021-05-26 DIAGNOSIS — Z23 Encounter for immunization: Secondary | ICD-10-CM | POA: Diagnosis not present

## 2021-05-31 ENCOUNTER — Ambulatory Visit (HOSPITAL_COMMUNITY): Payer: Medicare HMO | Admitting: Anesthesiology

## 2021-05-31 ENCOUNTER — Ambulatory Visit (HOSPITAL_COMMUNITY)
Admission: RE | Admit: 2021-05-31 | Discharge: 2021-05-31 | Disposition: A | Discharge status: Home / Self Care | Payer: Medicare HMO | Source: Ambulatory Visit | Attending: Internal Medicine | Admitting: Internal Medicine

## 2021-05-31 ENCOUNTER — Encounter (HOSPITAL_COMMUNITY)
Admission: RE | Disposition: A | Discharge status: Home / Self Care | Payer: Self-pay | Source: Ambulatory Visit | Attending: Internal Medicine

## 2021-05-31 ENCOUNTER — Encounter (HOSPITAL_COMMUNITY): Payer: Self-pay | Admitting: Internal Medicine

## 2021-05-31 ENCOUNTER — Other Ambulatory Visit: Payer: Self-pay

## 2021-05-31 DIAGNOSIS — R69 Illness, unspecified: Secondary | ICD-10-CM | POA: Diagnosis not present

## 2021-05-31 DIAGNOSIS — Z888 Allergy status to other drugs, medicaments and biological substances status: Secondary | ICD-10-CM | POA: Insufficient documentation

## 2021-05-31 DIAGNOSIS — Z79890 Hormone replacement therapy (postmenopausal): Secondary | ICD-10-CM | POA: Diagnosis not present

## 2021-05-31 DIAGNOSIS — Z881 Allergy status to other antibiotic agents status: Secondary | ICD-10-CM | POA: Insufficient documentation

## 2021-05-31 DIAGNOSIS — Z79899 Other long term (current) drug therapy: Secondary | ICD-10-CM | POA: Diagnosis not present

## 2021-05-31 DIAGNOSIS — F17210 Nicotine dependence, cigarettes, uncomplicated: Secondary | ICD-10-CM | POA: Insufficient documentation

## 2021-05-31 DIAGNOSIS — R1314 Dysphagia, pharyngoesophageal phase: Secondary | ICD-10-CM | POA: Insufficient documentation

## 2021-05-31 DIAGNOSIS — R1310 Dysphagia, unspecified: Secondary | ICD-10-CM

## 2021-05-31 DIAGNOSIS — E039 Hypothyroidism, unspecified: Secondary | ICD-10-CM | POA: Diagnosis not present

## 2021-05-31 HISTORY — PX: MALONEY DILATION: SHX5535

## 2021-05-31 HISTORY — PX: ESOPHAGOGASTRODUODENOSCOPY (EGD) WITH PROPOFOL: SHX5813

## 2021-05-31 SURGERY — ESOPHAGOGASTRODUODENOSCOPY (EGD) WITH PROPOFOL
Anesthesia: General

## 2021-05-31 MED ORDER — STERILE WATER FOR IRRIGATION IR SOLN: Status: DC | PRN

## 2021-05-31 MED ORDER — PROPOFOL 10 MG/ML IV BOLUS
INTRAVENOUS | Status: DC | PRN
  Administered 2021-05-31: 50 mg via INTRAVENOUS
  Administered 2021-05-31: 125 mg via INTRAVENOUS
  Administered 2021-05-31: 75 mg via INTRAVENOUS

## 2021-05-31 MED ORDER — LIDOCAINE HCL (CARDIAC) PF 50 MG/5ML IV SOSY
PREFILLED_SYRINGE | INTRAVENOUS | Status: DC | PRN
  Administered 2021-05-31: 50 mg via INTRAVENOUS

## 2021-05-31 MED ORDER — LACTATED RINGERS IV SOLN: INTRAVENOUS | Status: DC

## 2021-05-31 NOTE — Transfer of Care (Signed)
Immediate Anesthesia Transfer of Care Note  Patient: Jennifer [name]  Procedure(s) Performed: ESOPHAGOGASTRODUODENOSCOPY (EGD) WITH PROPOFOL [place]  Patient Location: Endoscopy Unit  Anesthesia Type:General  Level of Consciousness: sedated and patient cooperative  Airway & Oxygen Therapy: Patient Spontanous Breathing  Post-op Assessment: Report given to RN and Post -op Vital signs reviewed and stable  Post vital signs: Reviewed and stable  Last Vitals:  Vitals Value Taken Time  BP    Temp    Pulse    Resp    SpO2      Last Pain:  Vitals:   05/31/21 0938  TempSrc:   PainSc: 0-No pain      Patients Stated Pain Goal: 10 (16/10/96 0454)  Complications: No notable events documented.

## 2021-05-31 NOTE — Anesthesia Postprocedure Evaluation (Signed)
Anesthesia Post Note  Patient: ASSIA MEANOR  Procedure(s) Performed: ESOPHAGOGASTRODUODENOSCOPY (EGD) WITH PROPOFOL [place]  Patient location during evaluation: Phase II Anesthesia Type: General Level of consciousness: awake Pain management: pain level controlled Vital Signs Assessment: post-procedure vital signs reviewed and stable Respiratory status: spontaneous breathing and respiratory function stable Cardiovascular status: blood pressure returned to baseline and stable Postop Assessment: no headache and no apparent nausea or vomiting Anesthetic complications: no Comments: Late entry   No notable events documented.   Last Vitals:  Vitals:   05/31/21 0901 05/31/21 0956  BP: 130/73 112/80  Pulse: 93   Resp: (!) 21 (!) 23  Temp: 36.5 C   SpO2: 96% 97%    Last Pain:  Vitals:   05/31/21 0956  TempSrc:   PainSc: 0-No pain                 Louann Sjogren

## 2021-05-31 NOTE — H&P (Signed)
@LOGO @   Primary Care Physician:  Celene Squibb, MD Primary Gastroenterologist:  Dr. Gala Romney  Pre-Procedure History & Physical: HPI:  Jennifer [name] is a 75 y.o. female here for further evaluation/management of esophageal dysphagia.  GERD well-controlled on pantoprazole 40 mg daily  Past Medical History:  Diagnosis Date   Depression    GERD (gastroesophageal reflux disease)    History of colon polyps    remote past, unable to retrieve records    Hyperlipidemia    Hypothyroidism    Lymphocytic colitis    Rheumatoid arthritis [facility])     Past Surgical History:  Procedure Laterality Date   COLONOSCOPY     last one was about 3 yrs in [facility]   COLONOSCOPY N/A 02/19/2016   Dr. Gala Romney: three 3-6 mm polyps (tubular adenomas) in ascending colon, segmental biopsies consistent with lymphocytic colitis. Prescribed entocort.    COLONOSCOPY N/A 03/06/2019   Dr. Gala Romney: 2 polyps removed, tubular adenomas, diverticulosis.  NO random colon biopsies performed.  Next colonoscopy 5 years.   ESOPHAGOGASTRODUODENOSCOPY N/A 08/11/2014   RMR: MIld erosive  reflux esophagitis. small hiatal hernia. Abnormal gastric muocsa uncertain significance. Subtly abnormal duodeanl (bulbar) mucosa- status post multiple biopsies. BENIGN small bowel mucosa, no evidence of villous blulnting, mild reactive gastropathy on stomach biopsy    GALLBLADDER SURGERY     LEG SURGERY     right   PARTIAL HYSTERECTOMY      Prior to Admission medications   Medication Sig Start Date End Date Taking? Authorizing Provider  albuterol (VENTOLIN HFA) 108 (90 Base) MCG/ACT inhaler Inhale 1 puff into the lungs every 4 (four) hours as needed for wheezing or shortness of breath.   Yes [provider]  Calcium Carbonate-Vit D-Min (CALCIUM 600+D3 PLUS MINERALS) 600-800 MG-UNIT CHEW Chew 1 tablet by mouth in the morning and at bedtime.   Yes [provider]  cyclobenzaprine (FLEXERIL) 5 MG tablet Take 5 mg by mouth daily as needed  for spasms. 04/21/21  Yes [provider]  cycloSPORINE (RESTASIS) 0.05 % ophthalmic emulsion Place 1 drop into both eyes 3 (three) times daily.   Yes [provider]  dicyclomine (BENTYL) 10 MG capsule Take 1 capsule (10 mg total) by mouth 4 (four) times daily -  before meals and at bedtime. 03/26/21  Yes Erenest Rasher, PA-C  famotidine (PEPCID) 20 MG tablet Take 20 mg by mouth 2 (two) times daily as needed (itching.).   Yes [provider]  fluticasone (FLONASE) 50 MCG/ACT nasal spray Place 1 spray into both nostrils daily. 06/21/14  Yes [provider]  hydrOXYzine (VISTARIL) 25 MG capsule Take 25 mg by mouth daily as needed (severe itching).   Yes [provider]  levothyroxine (SYNTHROID) 75 MCG tablet Take 75 mcg by mouth daily before breakfast. 09/27/20  Yes [provider]  methocarbamol (ROBAXIN) 500 MG tablet Take 500 mg by mouth 3 (three) times daily as needed for muscle spasms.   Yes [provider]  mirabegron ER (MYRBETRIQ) 25 MG TB24 tablet Take 25 mg by mouth at bedtime.   Yes [provider]  pantoprazole (PROTONIX) 40 MG tablet TAKE ONE TABLET BY MOUTH ONCE DAILY BEFORE BREAKFAST. Patient taking differently: Take 40 mg by mouth daily before breakfast. 12/03/18  Yes Annitta Needs, NP  potassium chloride SA (KLOR-CON) 20 MEQ tablet TAKE 2 TABLETS DAILY AS NEEDED WHEN TAKING TORSEMIDE Patient taking differently: Take 20 mEq by mouth in the morning. 11/30/20  Yes Levell July  Kaylyn Lim., NP  simvastatin (ZOCOR) 40 MG tablet Take 40 mg by mouth every evening.   Yes [provider]  torsemide (DEMADEX) 20 MG tablet Take 20 mg by mouth daily with breakfast.   Yes [provider]  BEVESPI AEROSPHERE 9-4.8 MCG/ACT AERO Inhale 1 puff into the lungs in the morning and at bedtime. 05/11/21   [provider]  PROLIA 60 MG/ML SOSY injection Inject 60 mg into the skin every 6 (six) months. 05/14/20    [provider]    Allergies as of 04/01/2021 - Review Complete 03/26/2021  Allergen Reaction Noted   Desyrel [trazodone]  07/14/2014   Keflex [cephalexin]  07/14/2014   Klonopin [clonazepam]  07/14/2014   Minocin [minocycline hcl]  07/14/2014   Stelazine [trifluoperazine]  07/14/2014   Viberzi [eluxadoline]  03/31/2015    Family History  Problem Relation Age of Onset   Cancer Sister        blood disorder ??   Colon cancer Neg Hx     Social History   Socioeconomic History   Marital status: Married    Spouse name: Not on file   Number of children: 2   Years of education: Not on file   Highest education level: Not on file  Occupational History   Occupation: disability  Tobacco Use   Smoking status: Every Day    Packs/day: 0.10    Types: Cigarettes   Smokeless tobacco: Never   Tobacco comments:    quit 03/31/18  Vaping Use   Vaping Use: Never used  Substance and Sexual Activity   Alcohol use: No    Alcohol/week: 0.0 standard drinks   Drug use: No   Sexual activity: Not on file  Other Topics Concern   Not on file  Social History Narrative   Not on file   Social Determinants of Health   Financial Resource Strain: Not on file  Food Insecurity: Not on file  Transportation Needs: Not on file  Physical Activity: Not on file  Stress: Not on file  Social Connections: Not on file  Intimate Partner Violence: Not on file    Review of Systems: See HPI, otherwise negative ROS  Physical Exam: BP 130/73   Pulse 93   Temp 97.7 F (36.5 C) (Oral)   Resp (!) 21   Ht 5\' 4"  (1.626 m)   Wt 65.5 kg   SpO2 96%   BMI 24.80 kg/m  General:   Alert,  Well-developed, well-nourished, pleasant and cooperative in NAD Neck:  Supple; no masses or thyromegaly. No significant cervical adenopathy. Lungs:  Clear throughout to auscultation.   No wheezes, crackles, or rhonchi. No acute distress. Heart:  Regular rate and rhythm; no murmurs, clicks, rubs,  or  gallops. Abdomen: Non-distended, normal bowel sounds.  Soft and nontender without appreciable mass or hepatosplenomegaly.  Pulses:  Normal pulses noted. Extremities:  Without clubbing or edema.  Impression/Plan: 75 year old lady here for further evaluation of esophageal dysphagia via EGD with esophageal dilation as feasible/appropriate per plan.  The risks, benefits, limitations, alternatives and imponderables have been reviewed with the patient. Potential for esophageal dilation, biopsy, etc. have also been reviewed.  Questions have been answered. All parties agreeable.      Notice: This dictation was prepared with Dragon dictation along with smaller phrase technology. Any transcriptional errors that result from this process are unintentional and may not be corrected upon review.

## 2021-05-31 NOTE — Anesthesia Procedure Notes (Signed)
Date/Time: 05/31/2021 9:36 AM Performed by: Vista Deck, CRNA Pre-anesthesia Checklist: Patient identified, Emergency Drugs available, Suction available, Timeout performed and Patient being monitored Patient Re-evaluated:Patient Re-evaluated prior to induction Oxygen Delivery Method: Nasal Cannula

## 2021-05-31 NOTE — Op Note (Signed)
[facility] Patient Name: Jennifer [name] Procedure Date: 05/31/2021 9:28 AM MRN: [id number] Date of Birth: Feb 18, 1946 Attending MD: Norvel Richards , MD CSN: [id number] Age: 75 Admit Type: Outpatient Procedure:                Upper GI endoscopy Indications:              Dysphagia Providers:                Norvel Richards, MD, Rosina Lowenstein, RN, Thomas Hoff., Technician Referring MD:              Medicines:                Propofol per Anesthesia Complications:            No immediate complications. Estimated Blood Loss:     Estimated blood loss was minimal. Procedure:                Pre-Anesthesia Assessment:                           - Prior to the procedure, a History and Physical                            was performed, and patient medications and                            allergies were reviewed. The patient's tolerance of                            previous anesthesia was also reviewed. The risks                            and benefits of the procedure and the sedation                            options and risks were discussed with the patient.                            All questions were answered, and informed consent                            was obtained. Prior Anticoagulants: The patient has                            taken no previous anticoagulant or antiplatelet                            agents. ASA Grade Assessment: III - A patient with                            severe systemic disease. After reviewing the risks  and benefits, the patient was deemed in                            satisfactory condition to undergo the procedure.                           After obtaining informed consent, the endoscope was                            passed under direct vision. Throughout the                            procedure, the patient's blood pressure, pulse, and                            oxygen saturations  were monitored continuously. The                            [id number]-H190 ([id number]) scope was introduced through the                            mouth, and advanced to the second part of duodenum.                            The upper GI endoscopy was accomplished without                            difficulty. The patient tolerated the procedure                            well. Scope In: 9:43:03 AM Scope Out: 9:48:52 AM Total Procedure Duration: 0 hours 5 minutes 49 seconds  Findings:      The examined esophagus was normal. resistance at 56 Fr. The dilation       site was examined following endoscope reinsertion and showed no change.       Estimated blood loss was minimal.      The entire examined stomach was normal.      The duodenal bulb and second portion of the duodenum were normal. The       scope was withdrawn. Dilation was performed with a Maloney dilator with       mild resistance at 87 Fr. The scope was withdrawn. Dilation was       performed with a Maloney dilator with moderate Impression:               - Normal esophagus. Dilated.                           - Normal stomach.                           - Normal duodenal bulb and second portion of the                            duodenum.                           -  No specimens collected. Moderate Sedation:      Moderate (conscious) sedation was personally administered by an       anesthesia professional. The following parameters were monitored: oxygen       saturation, heart rate, blood pressure, and response to care. Recommendation:           - Patient has a contact number available for                            emergencies. The signs and symptoms of potential                            delayed complications were discussed with the                            patient. Return to normal activities tomorrow.                            Written discharge instructions were provided to the                            patient.                            - Advance diet as tolerated.                           - Continue present medications.                           - Await pathology results.                           - Return to my office in 6 months. Procedure Code(s):        --- Professional ---                           [phone], Esophagogastroduodenoscopy, flexible,                            transoral; diagnostic, including collection of                            specimen(s) by brushing or washing, when performed                            (separate procedure)                           43450, Dilation of esophagus, by unguided sound or                            bougie, single or multiple passes Diagnosis Code(s):        --- Professional ---                           R13.10, Dysphagia, unspecified CPT copyright 2019  American Medical Association. All rights reserved. The codes documented in this report are preliminary and upon coder review may  be revised to meet current compliance requirements. Cristopher Estimable. Omer Monter, MD Norvel Richards, MD 05/31/2021 10:02:36 AM This report has been signed electronically. Number of Addenda: 0

## 2021-05-31 NOTE — Anesthesia Preprocedure Evaluation (Signed)
Anesthesia Evaluation  Patient identified by MRN, date of birth, ID band Patient awake    Reviewed: Allergy & Precautions, H&P , NPO status , Patient's Chart, lab work & pertinent test results, reviewed documented beta blocker date and time   Airway Mallampati: II  TM Distance: >3 FB Neck ROM: full    Dental no notable dental hx.    Pulmonary neg pulmonary ROS, Current Smoker,    Pulmonary exam normal breath sounds clear to auscultation       Cardiovascular Exercise Tolerance: Good negative cardio ROS   Rhythm:regular Rate:Normal     Neuro/Psych PSYCHIATRIC DISORDERS Depression negative neurological ROS     GI/Hepatic Neg liver ROS, GERD  Medicated,  Endo/Other  Hypothyroidism   Renal/GU negative Renal ROS  negative genitourinary   Musculoskeletal   Abdominal   Peds  Hematology negative hematology ROS (+)   Anesthesia Other Findings 1. Left ventricular ejection fraction, by estimation, is 60 to 65%. The  left ventricle has normal function. The left ventricle has no regional  wall motion abnormalities. Left ventricular diastolic parameters are  consistent with Grade I diastolic  dysfunction (impaired relaxation).  2. Right ventricular systolic function is normal. The right ventricular  size is normal.  3. The mitral valve is normal in structure. No evidence of mitral valve  regurgitation. No evidence of mitral stenosis.  4. The aortic valve has an indeterminant number of cusps. Aortic valve  regurgitation is trivial.  5. The inferior vena cava is normal in size with greater than 50%  respiratory variability, suggesting right atrial pressure of 3 mmHg.   Reproductive/Obstetrics negative OB ROS                             Anesthesia Physical Anesthesia Plan  ASA: 2  Anesthesia Plan: General   Post-op Pain Management:    Induction:   PONV Risk Score and Plan: Propofol  infusion  Airway Management Planned:   Additional Equipment:   Intra-op Plan:   Post-operative Plan:   Informed Consent: I have reviewed the patients History and Physical, chart, labs and discussed the procedure including the risks, benefits and alternatives for the proposed anesthesia with the patient or authorized representative who has indicated his/her understanding and acceptance.     Dental Advisory Given  Plan Discussed with: CRNA  Anesthesia Plan Comments:         Anesthesia Quick Evaluation

## 2021-05-31 NOTE — Discharge Instructions (Addendum)
EGD Discharge instructions Please read the instructions outlined below and refer to this sheet in the next few weeks. These discharge instructions provide you with general information on caring for yourself after you leave the hospital. Your doctor may also give you specific instructions. While your treatment has been planned according to the most current medical practices available, unavoidable complications occasionally occur. If you have any problems or questions after discharge, please call your doctor. ACTIVITY You may resume your regular activity but move at a slower pace for the next 24 hours.  Take frequent rest periods for the next 24 hours.  Walking will help expel (get rid of) the air and reduce the bloated feeling in your abdomen.  No driving for 24 hours (because of the anesthesia (medicine) used during the test).  You may shower.  Do not sign any important legal documents or operate any machinery for 24 hours (because of the anesthesia used during the test).  NUTRITION Drink plenty of fluids.  You may resume your normal diet.  Begin with a light meal and progress to your normal diet.  Avoid alcoholic beverages for 24 hours or as instructed by your caregiver.  MEDICATIONS You may resume your normal medications unless your caregiver tells you otherwise.  WHAT YOU CAN EXPECT TODAY You may experience abdominal discomfort such as a feeling of fullness or "gas" pains.  FOLLOW-UP Your doctor will discuss the results of your test with you.  SEEK IMMEDIATE MEDICAL ATTENTION IF ANY OF THE FOLLOWING OCCUR: Excessive nausea (feeling sick to your stomach) and/or vomiting.  Severe abdominal pain and distention (swelling).  Trouble swallowing.  Temperature over 101 F (37.8 C).  Rectal bleeding or vomiting of blood.     Your esophagus was stretched today  Continue pantoprazole 40 mg daily  Office visit with [place] in 6 months  At patient request, I called Gracy Racer at [phone]  -reviewed results

## 2021-06-02 ENCOUNTER — Encounter (HOSPITAL_COMMUNITY): Payer: Self-pay | Admitting: Internal Medicine

## 2021-06-16 ENCOUNTER — Other Ambulatory Visit: Payer: Self-pay | Admitting: Gastroenterology

## 2021-06-16 DIAGNOSIS — R197 Diarrhea, unspecified: Secondary | ICD-10-CM

## 2021-06-20 NOTE — Progress Notes (Deleted)
Cardiology Office Note  Date: 06/20/2021   ID: Jennifer, [name] June 03, 1946, MRN [id number]  PCP:  Celene Squibb, MD  Cardiologist:  Carlyle Dolly, MD Electrophysiologist:  None   Chief Complaint: Cardiac follow-up  History of Present Illness: Jennifer [name] is a 75 y.o. female with a history of leg edema, palpitations, nonspecific abnormal EKG.  HLD, hypothyroidism, rheumatoid arthritis, GERD.  Last seen by Dr. Harl Bowie 02/06/2020 for lower extremity edema, palpitations, abnormal EKG.  Leg edema had been ongoing for 10 years.  PCP had started torsemide which seem to be controlling the lower extremity edema.  Previous echo 2018 showed EF 60 to 65%.  No WMA's, grade 1 DDx.  She had compression stockings.  Swelling was stable.  Palpitations only occurring for a few minutes after activity not at rest.  Described as feeling of heart pounding lasting a few minutes then resolved.  Started 2 months prior to visit.  Occurring every 3 weeks approximately.  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  Palpitations were fairly infrequent.  Continue to monitor.  If progression would plan for event monitor.    She was last here for complaints of lower extremity edema.  She was referred by GI for lower extremity edema and recent abdominal ultrasound and demonstrating a pericardial effusion which was incidentally identified and incompletely evaluated.  She had been having some swelling in her ankles.  She stated when she slept overnight the swelling in her ankles goes went away but after being up during the day the swelling returns.  She had an upcoming echocardiogram on 12/16/2020.  She denied any recent shortness of breath but has complained in the past.  She had been doing more housework and yard work without any shortness of breath/dyspnea.    She is here for follow-up for recent echocardiogram secondary to complaints of lower extremity edema.  Echocardiogram on 12/16/2020  demonstrated EF of 60 to 65%.  No WMA's, G1 DD.  She continues to have some minor lower extremity edema and right lower extremity greater than left.  She is continuing to take her torsemide 40 mg as needed for fluid retention.  She denies any DOE or SOB.  Denies any PND, orthopnea, dizziness, lightheadedness, presyncopal or syncopal episodes.  No CVA or TIA-like symptoms.  No bleeding issues.  No DVT or PE-like symptoms.   Past Medical History:  Diagnosis Date   Depression    GERD (gastroesophageal reflux disease)    History of colon polyps    remote past, unable to retrieve records    Hyperlipidemia    Hypothyroidism    Lymphocytic colitis    Rheumatoid arthritis [facility])     Past Surgical History:  Procedure Laterality Date   COLONOSCOPY     last one was about 3 yrs in [facility]   COLONOSCOPY N/A 02/19/2016   Dr. Gala Romney: three 3-6 mm polyps (tubular adenomas) in ascending colon, segmental biopsies consistent with lymphocytic colitis. Prescribed entocort.    COLONOSCOPY N/A 03/06/2019   Dr. Gala Romney: 2 polyps removed, tubular adenomas, diverticulosis.  NO random colon biopsies performed.  Next colonoscopy 5 years.   ESOPHAGOGASTRODUODENOSCOPY N/A 08/11/2014   RMR: MIld erosive  reflux esophagitis. small hiatal hernia. Abnormal gastric muocsa uncertain significance. Subtly abnormal duodeanl (bulbar) mucosa- status post multiple biopsies. BENIGN small bowel mucosa, no evidence of villous blulnting, mild reactive gastropathy on stomach biopsy    ESOPHAGOGASTRODUODENOSCOPY (EGD) WITH PROPOFOL N/A 05/31/2021   Procedure: ESOPHAGOGASTRODUODENOSCOPY (EGD) WITH  PROPOFOL;  Surgeon: Daneil Dolin, MD;  [facility];  Service: Endoscopy;  Laterality: N/A;  10:30am   GALLBLADDER SURGERY     LEG SURGERY     right   MALONEY DILATION N/A 05/31/2021   Procedure: MALONEY DILATION;  Surgeon: Daneil Dolin, MD;  Location: [facility];  Service: Endoscopy;  Laterality: N/A;   PARTIAL  HYSTERECTOMY      Current Outpatient Medications  Medication Sig Dispense Refill   albuterol (VENTOLIN HFA) 108 (90 Base) MCG/ACT inhaler Inhale 1 puff into the lungs every 4 (four) hours as needed for wheezing or shortness of breath.     BEVESPI AEROSPHERE 9-4.8 MCG/ACT AERO Inhale 1 puff into the lungs in the morning and at bedtime.     Calcium Carbonate-Vit D-Min (CALCIUM 600+D3 PLUS MINERALS) 600-800 MG-UNIT CHEW Chew 1 tablet by mouth in the morning and at bedtime.     cyclobenzaprine (FLEXERIL) 5 MG tablet Take 5 mg by mouth daily as needed for spasms.     cycloSPORINE (RESTASIS) 0.05 % ophthalmic emulsion Place 1 drop into both eyes 3 (three) times daily.     dicyclomine (BENTYL) 10 MG capsule Take ONE capsule by MOUTH FOUR times daily - BEFORE meals AND AT bedtime. 120 capsule 3   famotidine (PEPCID) 20 MG tablet Take 20 mg by mouth 2 (two) times daily as needed (itching.).     fluticasone (FLONASE) 50 MCG/ACT nasal spray Place 1 spray into both nostrils daily.  1   hydrOXYzine (VISTARIL) 25 MG capsule Take 25 mg by mouth daily as needed (severe itching).     levothyroxine (SYNTHROID) 75 MCG tablet Take 75 mcg by mouth daily before breakfast.     methocarbamol (ROBAXIN) 500 MG tablet Take 500 mg by mouth 3 (three) times daily as needed for muscle spasms.     mirabegron ER (MYRBETRIQ) 25 MG TB24 tablet Take 25 mg by mouth at bedtime.     pantoprazole (PROTONIX) 40 MG tablet TAKE ONE TABLET BY MOUTH ONCE DAILY BEFORE BREAKFAST. (Patient taking differently: Take 40 mg by mouth daily before breakfast.) 90 tablet 3   potassium chloride SA (KLOR-CON) 20 MEQ tablet TAKE 2 TABLETS DAILY AS NEEDED WHEN TAKING TORSEMIDE (Patient taking differently: Take 20 mEq by mouth in the morning.) 180 tablet 1   PROLIA 60 MG/ML SOSY injection Inject 60 mg into the skin every 6 (six) months.     simvastatin (ZOCOR) 40 MG tablet Take 40 mg by mouth every evening.     torsemide (DEMADEX) 20 MG tablet Take 20 mg  by mouth daily with breakfast.     No current facility-administered medications for this visit.   Allergies:  Desyrel [trazodone], Keflex [cephalexin], Klonopin [clonazepam], Minocin [minocycline hcl], Stelazine [trifluoperazine], and Viberzi [eluxadoline]   Social History: The patient  reports that she has been smoking cigarettes. She has been smoking an average of .1 packs per day. She has never used smokeless tobacco. She reports that she does not drink alcohol and does not use drugs.   Family History: The patient's family history includes Cancer in her sister.   ROS:  Please see the history of present illness. Otherwise, complete review of systems is positive for none.  All other systems are reviewed and negative.   Physical Exam: VS:  There were no vitals taken for this visit., BMI There is no height or weight on file to calculate BMI.  Wt Readings from Last 3 Encounters:  05/31/21 144 lb 8  oz (65.5 kg)  03/26/21 146 lb (66.2 kg)  12/30/20 149 lb 6.4 oz (67.8 kg)    General: Patient appears comfortable at rest. Neck: Supple, no elevated JVP or carotid bruits, no thyromegaly. Lungs: Clear to auscultation, nonlabored breathing at rest. Cardiac: Regular rate and rhythm, no S3 or significant systolic murmur, no pericardial rub. Extremities: Mild pitting edema, distal pulses 2+. Skin: Warm and dry. Musculoskeletal: No kyphosis. Neuropsychiatric: Alert and oriented x3, affect grossly appropriate.  ECG:  EKG February 06, 2020 normal sinus rhythm rate of 92, left axis deviation, pulmonary disease pattern, septal infarct, age undetermined.  Recent Labwork: 11/30/2020: BUN 11; Creat 0.66; Potassium 3.7; Sodium 144 03/31/2021: ALT 13; AST 15; TSH 2.68  No results found for: CHOL, TRIG, HDL, CHOLHDL, VLDL, LDLCALC, LDLDIRECT  Other Studies Reviewed Today:   Echocardiogram 12/16/2020 1. Left ventricular ejection fraction, by estimation, is 60 to 65%. The  left ventricle has normal function.  The left ventricle has no regional  wall motion abnormalities. Left ventricular diastolic parameters are  consistent with Grade I diastolic  dysfunction (impaired relaxation).   2. Right ventricular systolic function is normal. The right ventricular  size is normal.   3. The mitral valve is normal in structure. No evidence of mitral valve  regurgitation. No evidence of mitral stenosis.   4. The aortic valve has an indeterminant number of cusps. Aortic valve  regurgitation is trivial.   5. The inferior vena cava is normal in size with greater than 50%  respiratory variability, suggesting right atrial pressure of 3 mmHg.     Echocardiogram 09/12/2016 Study Conclusions   - Left ventricle: The cavity size was normal. Wall thickness was    normal. Systolic function was normal. The estimated ejection    fraction was in the range of 60% to 65%. Wall motion was normal;    there were no regional wall motion abnormalities. Doppler    parameters are consistent with abnormal left ventricular    relaxation (grade 1 diastolic dysfunction).  - Aortic valve: Mildly calcified annulus. Trileaflet; normal    thickness leaflets. Valve area (VTI): 1.94 cm^2. Valve area    (Vmax): 1.75 cm^2. Valve area (Vmean): 1.78 cm^2.  - Technically adequate study.  Assessment and Plan:  1. Leg edema   2. Palpitations     1. Leg edema Continues with some mild lower extremity edema right greater than left.  Continuing to take torsemide 40 mg as needed for fluid retention.  Recent echocardiogram on 12/16/2020 demonstrated EF of 60 to 65%.  No WMA's, G1 DD.  Continue torsemide 40 mg as needed.  2. Palpitations No recent complaints of palpitations or arrhythmias.  Medication Adjustments/Labs and Tests Ordered: Current medicines are reviewed at length with the patient today.  Concerns regarding medicines are outlined above.   Disposition: Follow-up with Dr. Harl Bowie or APP 6 months. Signed, Levell July,  NP 06/20/2021 5:40 PM    [facility] [place], [place], [place] [address] Phone: [phone]; Fax: [phone]

## 2021-06-21 ENCOUNTER — Telehealth: Payer: Self-pay | Admitting: Family Medicine

## 2021-06-21 ENCOUNTER — Encounter: Payer: Self-pay | Admitting: Family Medicine

## 2021-06-21 ENCOUNTER — Ambulatory Visit: Payer: Medicare HMO | Admitting: Family Medicine

## 2021-06-21 DIAGNOSIS — R002 Palpitations: Secondary | ICD-10-CM

## 2021-06-21 DIAGNOSIS — R600 Localized edema: Secondary | ICD-10-CM

## 2021-06-21 NOTE — Telephone Encounter (Signed)
No Show letter sent to patient.

## 2021-06-30 DIAGNOSIS — E782 Mixed hyperlipidemia: Secondary | ICD-10-CM | POA: Diagnosis not present

## 2021-06-30 DIAGNOSIS — I10 Essential (primary) hypertension: Secondary | ICD-10-CM | POA: Diagnosis not present

## 2021-07-01 ENCOUNTER — Ambulatory Visit: Payer: Medicare HMO | Admitting: Family Medicine

## 2021-07-21 DIAGNOSIS — J069 Acute upper respiratory infection, unspecified: Secondary | ICD-10-CM | POA: Diagnosis not present

## 2021-07-30 DIAGNOSIS — E782 Mixed hyperlipidemia: Secondary | ICD-10-CM | POA: Diagnosis not present

## 2021-07-30 DIAGNOSIS — I10 Essential (primary) hypertension: Secondary | ICD-10-CM | POA: Diagnosis not present

## 2021-08-12 ENCOUNTER — Ambulatory Visit: Payer: Medicare HMO | Admitting: Gastroenterology

## 2021-08-31 DIAGNOSIS — R062 Wheezing: Secondary | ICD-10-CM | POA: Diagnosis not present

## 2021-08-31 DIAGNOSIS — R059 Cough, unspecified: Secondary | ICD-10-CM | POA: Diagnosis not present

## 2021-08-31 DIAGNOSIS — J019 Acute sinusitis, unspecified: Secondary | ICD-10-CM | POA: Diagnosis not present

## 2021-08-31 DIAGNOSIS — E782 Mixed hyperlipidemia: Secondary | ICD-10-CM | POA: Diagnosis not present

## 2021-08-31 DIAGNOSIS — R0981 Nasal congestion: Secondary | ICD-10-CM | POA: Diagnosis not present

## 2021-08-31 DIAGNOSIS — H9202 Otalgia, left ear: Secondary | ICD-10-CM | POA: Diagnosis not present

## 2021-08-31 DIAGNOSIS — B370 Candidal stomatitis: Secondary | ICD-10-CM | POA: Diagnosis not present

## 2021-08-31 DIAGNOSIS — I10 Essential (primary) hypertension: Secondary | ICD-10-CM | POA: Diagnosis not present

## 2021-09-05 ENCOUNTER — Other Ambulatory Visit: Payer: Self-pay | Admitting: Gastroenterology

## 2021-09-05 DIAGNOSIS — R197 Diarrhea, unspecified: Secondary | ICD-10-CM

## 2021-10-04 DIAGNOSIS — E039 Hypothyroidism, unspecified: Secondary | ICD-10-CM | POA: Insufficient documentation

## 2021-10-04 DIAGNOSIS — D72829 Elevated white blood cell count, unspecified: Secondary | ICD-10-CM | POA: Insufficient documentation

## 2021-10-04 DIAGNOSIS — M810 Age-related osteoporosis without current pathological fracture: Secondary | ICD-10-CM | POA: Insufficient documentation

## 2021-10-04 DIAGNOSIS — M545 Low back pain, unspecified: Secondary | ICD-10-CM | POA: Insufficient documentation

## 2021-10-04 DIAGNOSIS — J449 Chronic obstructive pulmonary disease, unspecified: Secondary | ICD-10-CM | POA: Insufficient documentation

## 2021-10-04 DIAGNOSIS — R0609 Other forms of dyspnea: Secondary | ICD-10-CM | POA: Insufficient documentation

## 2021-10-04 DIAGNOSIS — E46 Unspecified protein-calorie malnutrition: Secondary | ICD-10-CM | POA: Insufficient documentation

## 2021-10-04 DIAGNOSIS — G8929 Other chronic pain: Secondary | ICD-10-CM | POA: Insufficient documentation

## 2021-10-11 DIAGNOSIS — E039 Hypothyroidism, unspecified: Secondary | ICD-10-CM | POA: Diagnosis not present

## 2021-10-11 DIAGNOSIS — E7800 Pure hypercholesterolemia, unspecified: Secondary | ICD-10-CM | POA: Diagnosis not present

## 2021-10-11 DIAGNOSIS — R7301 Impaired fasting glucose: Secondary | ICD-10-CM | POA: Diagnosis not present

## 2021-10-14 DIAGNOSIS — E039 Hypothyroidism, unspecified: Secondary | ICD-10-CM | POA: Diagnosis not present

## 2021-10-14 DIAGNOSIS — R69 Illness, unspecified: Secondary | ICD-10-CM | POA: Diagnosis not present

## 2021-10-14 DIAGNOSIS — E7800 Pure hypercholesterolemia, unspecified: Secondary | ICD-10-CM | POA: Diagnosis not present

## 2021-10-14 DIAGNOSIS — M810 Age-related osteoporosis without current pathological fracture: Secondary | ICD-10-CM | POA: Diagnosis not present

## 2021-10-14 DIAGNOSIS — E46 Unspecified protein-calorie malnutrition: Secondary | ICD-10-CM | POA: Diagnosis not present

## 2021-10-14 DIAGNOSIS — J449 Chronic obstructive pulmonary disease, unspecified: Secondary | ICD-10-CM | POA: Diagnosis not present

## 2021-10-14 DIAGNOSIS — K52832 Lymphocytic colitis: Secondary | ICD-10-CM | POA: Diagnosis not present

## 2021-10-14 DIAGNOSIS — M545 Low back pain, unspecified: Secondary | ICD-10-CM | POA: Diagnosis not present

## 2021-10-14 DIAGNOSIS — R0609 Other forms of dyspnea: Secondary | ICD-10-CM | POA: Diagnosis not present

## 2021-10-14 DIAGNOSIS — Z716 Tobacco abuse counseling: Secondary | ICD-10-CM | POA: Diagnosis not present

## 2021-10-14 DIAGNOSIS — R7301 Impaired fasting glucose: Secondary | ICD-10-CM | POA: Diagnosis not present

## 2021-10-14 DIAGNOSIS — H1045 Other chronic allergic conjunctivitis: Secondary | ICD-10-CM | POA: Diagnosis not present

## 2021-10-14 DIAGNOSIS — R600 Localized edema: Secondary | ICD-10-CM | POA: Diagnosis not present

## 2021-10-19 DIAGNOSIS — M810 Age-related osteoporosis without current pathological fracture: Secondary | ICD-10-CM | POA: Diagnosis not present

## 2021-11-13 NOTE — Progress Notes (Deleted)
? ? ?Referring Provider: Celene Squibb, MD ?Primary Care Physician:  Celene Squibb, MD ?Primary GI Physician: Dr. Gala Romney ? ?No chief complaint on file. ? ? ?HPI:   ?Jennifer [name] is a 76 y.o. female with a history of GERD and lymphocytic colitis diagnosed in 2017 with inability to successfully taper off of Entocort for any length of time since October 2020.  Stool studies have been negative and celiac screen also negative.  Acute uncomplicated diverticulitis in mid sigmoid colon on 07/22/19. Last colonoscopy on 03/06/2019 with 2 tubular adenomas and diverticulosis.  Recommendations to repeat in 5 years.  Also with mildly elevated AST in March 2022.  Additional work-up with hepatitis A , B, and C all negative.  No immunity to hepatitis A or B.  Iron panel within normal limits.  Abdominal ultrasound with chronically dilated CBD without significant change s/p cholecystectomy, 1.9 cm cyst in the right hepatic lobe (previously evaluated by MRI and found to be benign), hepatic steatosis.  ? ?Last seen in our office 03/26/2021.  She had stopped Entocort 3 months prior and did not call our office to get a refill.  Reported having 1-9 bowel movements daily on Entocort, but was currently having 3-6 watery bowel movements daily.  Nocturnal bowel movements as well without BRBPR or melena.  Reported postprandial upper and lower abdominal pain that started after she stopped Entocort.  Pain would be relieved with a bowel movement.  Reported she also had a recent course of prednisone for her back which did not help her diarrhea.  She denied NSAIDs.  Has been on antibiotics for her sinuses a couple times over the last 6 months.  Continued with solid food and pill dysphagia.  GERD well controlled on pantoprazole 40 mg daily.  She had lost 7 pounds over the last 5 months and reported she was eating less due to diarrhea.  Etiology of her persistent diarrhea was unclear.  Recommended stool studies, TSH, trial of dicyclomine for possible  IBS-D.  May need to consider early interval colonoscopy.  May also need to consider cross-sectional imaging if colonoscopy was negative due to weight loss.  For dysphagia, plan to proceed with EGD.  Protonix was continued for GERD.  Also plan to update HFP due to history of elevated AST.  Recommended smoking cessation. ? ?LFTs had returned to normal. ?TSH within normal limits. ?C. difficile and GI pathogen panel negative. ?Offered colonoscopy, but patient preferred to hold off on this and try budesonide again.   ?Budesonide was resumed on 10/25.  Requested 3 to 4-week progress report, but patient did not call back. ? ?EGD 05/31/2021 normal esophagus s/p dilation, normal stomach and examined duodenum. ? ? ?Today:  ? ? ? ??Cholestyramine ? ?Past Medical History:  ?Diagnosis Date  ? Depression   ? GERD (gastroesophageal reflux disease)   ? History of colon polyps   ? remote past, unable to retrieve records   ? Hyperlipidemia   ? Hypothyroidism   ? Lymphocytic colitis   ? Rheumatoid arthritis ([place])   ? ? ?Past Surgical History:  ?Procedure Laterality Date  ? COLONOSCOPY    ? last one was about 3 yrs in [place]  ? COLONOSCOPY N/A 02/19/2016  ? Dr. Gala Romney: three 3-6 mm polyps (tubular adenomas) in ascending colon, segmental biopsies consistent with lymphocytic colitis. Prescribed entocort.   ? COLONOSCOPY N/A 03/06/2019  ? Dr. Gala Romney: 2 polyps removed, tubular adenomas, diverticulosis.  NO random colon biopsies performed.  Next colonoscopy 5 years.  ?  ESOPHAGOGASTRODUODENOSCOPY N/A 08/11/2014  ? RMR: MIld erosive  reflux esophagitis. small hiatal hernia. Abnormal gastric muocsa uncertain significance. Subtly abnormal duodeanl (bulbar) mucosa- status post multiple biopsies. BENIGN small bowel mucosa, no evidence of villous blulnting, mild reactive gastropathy on stomach biopsy   ? ESOPHAGOGASTRODUODENOSCOPY (EGD) WITH PROPOFOL N/A 05/31/2021  ? Procedure: ESOPHAGOGASTRODUODENOSCOPY (EGD) WITH PROPOFOL;  Surgeon: Daneil Dolin, MD;  [facility];  Service: Endoscopy;  Laterality: N/A;  10:30am  ? GALLBLADDER SURGERY    ? LEG SURGERY    ? right  ? MALONEY DILATION N/A 05/31/2021  ? Procedure: MALONEY DILATION;  Surgeon: Daneil Dolin, MD;  [facility];  Service: Endoscopy;  Laterality: N/A;  ? PARTIAL HYSTERECTOMY    ? ? ?Current Outpatient Medications  ?Medication Sig Dispense Refill  ? albuterol (VENTOLIN HFA) 108 (90 Base) MCG/ACT inhaler Inhale 1 puff into the lungs every 4 (four) hours as needed for wheezing or shortness of breath.    ? BEVESPI AEROSPHERE 9-4.8 MCG/ACT AERO Inhale 1 puff into the lungs in the morning and at bedtime.    ? Calcium Carbonate-Vit D-Min (CALCIUM 600+D3 PLUS MINERALS) 600-800 MG-UNIT CHEW Chew 1 tablet by mouth in the morning and at bedtime.    ? cyclobenzaprine (FLEXERIL) 5 MG tablet Take 5 mg by mouth daily as needed for spasms.    ? cycloSPORINE (RESTASIS) 0.05 % ophthalmic emulsion Place 1 drop into both eyes 3 (three) times daily.    ? dicyclomine (BENTYL) 10 MG capsule Take ONE capsule by MOUTH FOUR times daily - BEFORE meals AND AT bedtime. 120 capsule 3  ? famotidine (PEPCID) 20 MG tablet Take 20 mg by mouth 2 (two) times daily as needed (itching.).    ? fluticasone (FLONASE) 50 MCG/ACT nasal spray Place 1 spray into both nostrils daily.  1  ? hydrOXYzine (VISTARIL) 25 MG capsule Take 25 mg by mouth daily as needed (severe itching).    ? levothyroxine (SYNTHROID) 75 MCG tablet Take 75 mcg by mouth daily before breakfast.    ? methocarbamol (ROBAXIN) 500 MG tablet Take 500 mg by mouth 3 (three) times daily as needed for muscle spasms.    ? mirabegron ER (MYRBETRIQ) 25 MG TB24 tablet Take 25 mg by mouth at bedtime.    ? pantoprazole (PROTONIX) 40 MG tablet TAKE ONE TABLET BY MOUTH ONCE DAILY BEFORE BREAKFAST. (Patient taking differently: Take 40 mg by mouth daily before breakfast.) 90 tablet 3  ? potassium chloride SA (KLOR-CON) 20 MEQ tablet TAKE 2 TABLETS DAILY AS  NEEDED WHEN TAKING TORSEMIDE (Patient taking differently: Take 20 mEq by mouth in the morning.) 180 tablet 1  ? PROLIA 60 MG/ML SOSY injection Inject 60 mg into the skin every 6 (six) months.    ? simvastatin (ZOCOR) 40 MG tablet Take 40 mg by mouth every evening.    ? torsemide (DEMADEX) 20 MG tablet Take 20 mg by mouth daily with breakfast.    ? ?No current facility-administered medications for this visit.  ? ? ?Allergies as of 11/15/2021 - Review Complete 05/31/2021  ?Allergen Reaction Noted  ? Desyrel [trazodone] Other (See Comments) 07/14/2014  ? Keflex [cephalexin] Other (See Comments) 07/14/2014  ? Klonopin [clonazepam] Other (See Comments) 07/14/2014  ? Minocin [minocycline hcl] Other (See Comments) 07/14/2014  ? Stelazine [trifluoperazine]  07/14/2014  ? Viberzi [eluxadoline]  03/31/2015  ? ? ?Family History  ?Problem Relation Age of Onset  ? Cancer Sister   ?     blood  disorder ??  ? Colon cancer Neg Hx   ? ? ?Social History  ? ?Socioeconomic History  ? Marital status: Widowed  ?  Spouse name: Not on file  ? Number of children: 2  ? Years of education: Not on file  ? Highest education level: Not on file  ?Occupational History  ? Occupation: disability  ?Tobacco Use  ? Smoking status: Every Day  ?  Packs/day: 0.10  ?  Types: Cigarettes  ? Smokeless tobacco: Never  ? Tobacco comments:  ?  quit 03/31/18  ?Vaping Use  ? Vaping Use: Never used  ?Substance and Sexual Activity  ? Alcohol use: No  ?  Alcohol/week: 0.0 standard drinks  ? Drug use: No  ? Sexual activity: Not on file  ?Other Topics Concern  ? Not on file  ?Social History Narrative  ? Not on file  ? ?Social Determinants of Health  ? ?Financial Resource Strain: Not on file  ?Food Insecurity: Not on file  ?Transportation Needs: Not on file  ?Physical Activity: Not on file  ?Stress: Not on file  ?Social Connections: Not on file  ? ? ?Review of Systems: ?Gen: Denies fever, chills, cold or flulike symptoms, presyncope, syncope. ?CV: Denies chest pain,  palpitations. ?Resp: Denies dyspnea or cough. ?GI: See HPI ?Heme: See HPI ? ?Physical Exam: ?There were no vitals taken for this visit. ?General:   Alert and oriented. No distress noted. Pleasant and cooperative.

## 2021-11-15 ENCOUNTER — Ambulatory Visit: Payer: Medicare HMO | Admitting: Gastroenterology

## 2021-11-25 DIAGNOSIS — E039 Hypothyroidism, unspecified: Secondary | ICD-10-CM | POA: Diagnosis not present

## 2021-11-30 ENCOUNTER — Ambulatory Visit (INDEPENDENT_AMBULATORY_CARE_PROVIDER_SITE_OTHER): Payer: Medicare HMO | Admitting: Internal Medicine

## 2021-11-30 ENCOUNTER — Encounter: Payer: Self-pay | Admitting: Internal Medicine

## 2021-11-30 VITALS — BP 133/86 | HR 99 | Temp 97.1°F | Ht 64.0 in | Wt 149.4 lb

## 2021-11-30 DIAGNOSIS — K529 Noninfective gastroenteritis and colitis, unspecified: Secondary | ICD-10-CM

## 2021-11-30 NOTE — Patient Instructions (Signed)
Good to see you again today! ? ?Lets get a formed stool and for fecal elastase testing ? ?Serum TTG IgA level ? ?Take Protonix 40 mg every day before breakfast.  Use famotidine or Pepcid only as needed for breakthrough symptoms ? ?Office visit with [place] in 3 months ? ?Lan for colonoscopy 2025. ? ? ?

## 2021-11-30 NOTE — Progress Notes (Signed)
? ? ?Primary Care Physician:  Celene Squibb, MD ?Primary Gastroenterologist:  Dr. Gala Romney ? ?Pre-Procedure History & Physical: ?HPI:  Jennifer [name] is a 76 y.o. female here for follow-up diarrhea.  Distant history of lymphocytic colitis.  Biopsies and TTG IgA negative for celiac previously.  Was having intermittent diarrhea last year.  Had run out of Entocort.  We saw her in follow-up and routine stool studies negative GIP, C. difficile.  Did not submit a fecal elastase.  Through Dr. Juel Burrow office, apparently has been maintained on Entocort 9 mg daily since last year without a taper.  She states it does improve her diarrhea and makes her body feel much better all over, particularly her arthritis. ?No dysphagia since empiric esophageal dilation.  She has gained 3 pounds since her last office visit. ?History of colonic adenomas; due for surveillance 2025. ? ?Past Medical History:  ?Diagnosis Date  ? Depression   ? GERD (gastroesophageal reflux disease)   ? History of colon polyps   ? remote past, unable to retrieve records   ? Hyperlipidemia   ? Hypothyroidism   ? Lymphocytic colitis   ? Rheumatoid arthritis ([place])   ? ? ?Past Surgical History:  ?Procedure Laterality Date  ? COLONOSCOPY    ? last one was about 3 yrs in [place]  ? COLONOSCOPY N/A 02/19/2016  ? Dr. Gala Romney: three 3-6 mm polyps (tubular adenomas) in ascending colon, segmental biopsies consistent with lymphocytic colitis. Prescribed entocort.   ? COLONOSCOPY N/A 03/06/2019  ? Dr. Gala Romney: 2 polyps removed, tubular adenomas, diverticulosis.  NO random colon biopsies performed.  Next colonoscopy 5 years.  ? ESOPHAGOGASTRODUODENOSCOPY N/A 08/11/2014  ? RMR: MIld erosive  reflux esophagitis. small hiatal hernia. Abnormal gastric muocsa uncertain significance. Subtly abnormal duodeanl (bulbar) mucosa- status post multiple biopsies. BENIGN small bowel mucosa, no evidence of villous blulnting, mild reactive gastropathy on stomach biopsy   ? ESOPHAGOGASTRODUODENOSCOPY  (EGD) WITH PROPOFOL N/A 05/31/2021  ? Procedure: ESOPHAGOGASTRODUODENOSCOPY (EGD) WITH PROPOFOL;  Surgeon: Daneil Dolin, MD;  [facility];  Service: Endoscopy;  Laterality: N/A;  10:30am  ? GALLBLADDER SURGERY    ? LEG SURGERY    ? right  ? MALONEY DILATION N/A 05/31/2021  ? Procedure: MALONEY DILATION;  Surgeon: Daneil Dolin, MD;  [facility];  Service: Endoscopy;  Laterality: N/A;  ? PARTIAL HYSTERECTOMY    ? ? ?Prior to Admission medications   ?Medication Sig Start Date End Date Taking? Authorizing Provider  ?albuterol (VENTOLIN HFA) 108 (90 Base) MCG/ACT inhaler Inhale 1 puff into the lungs every 4 (four) hours as needed for wheezing or shortness of breath.   Yes [provider]  ?BEVESPI AEROSPHERE 9-4.8 MCG/ACT AERO Inhale 1 puff into the lungs in the morning and at bedtime. 05/11/21  Yes [provider]  ?Calcium Carbonate-Vit D-Min (CALCIUM 600+D3 PLUS MINERALS) 600-800 MG-UNIT CHEW Chew 1 tablet by mouth in the morning and at bedtime.   Yes [provider]  ?cyclobenzaprine (FLEXERIL) 5 MG tablet Take 5 mg by mouth daily as needed for spasms. 04/21/21  Yes [provider]  ?cycloSPORINE (RESTASIS) 0.05 % ophthalmic emulsion Place 1 drop into both eyes 3 (three) times daily.   Yes [provider]  ?dicyclomine (BENTYL) 10 MG capsule Take ONE capsule by MOUTH FOUR times daily - BEFORE meals AND AT bedtime. 09/06/21  Yes Mahala Menghini, PA-C  ?famotidine (PEPCID) 20 MG tablet Take 20 mg by mouth 2 (two) times daily as  needed (itching.).   Yes [provider]  ?fluticasone (FLONASE) 50 MCG/ACT nasal spray Place 1 spray into both nostrils daily. 06/21/14  Yes [provider]  ?hydrOXYzine (VISTARIL) 25 MG capsule Take 25 mg by mouth daily as needed (severe itching).   Yes [provider]  ?Levothyroxine Sodium 50 MCG CAPS Take 50 mcg by mouth daily before breakfast. 09/27/20  Yes [provider]   ?methocarbamol (ROBAXIN) 500 MG tablet Take 500 mg by mouth 3 (three) times daily as needed for muscle spasms.   Yes [provider]  ?mirabegron ER (MYRBETRIQ) 25 MG TB24 tablet Take 25 mg by mouth at bedtime.   Yes [provider]  ?pantoprazole (PROTONIX) 40 MG tablet TAKE ONE TABLET BY MOUTH ONCE DAILY BEFORE BREAKFAST. ?Patient taking differently: Take 40 mg by mouth daily before breakfast. 12/03/18  Yes Annitta Needs, NP  ?potassium chloride SA (KLOR-CON) 20 MEQ tablet TAKE 2 TABLETS DAILY AS NEEDED WHEN TAKING TORSEMIDE ?Patient taking differently: Take 20 mEq by mouth in the morning. 11/30/20  Yes Verta Ellen., NP  ?PROLIA 60 MG/ML SOSY injection Inject 60 mg into the skin every 6 (six) months. 05/14/20  Yes [provider]  ?simvastatin (ZOCOR) 40 MG tablet Take 40 mg by mouth every evening.   Yes [provider]  ?torsemide (DEMADEX) 20 MG tablet Take 20 mg by mouth daily with breakfast.   Yes [provider]  ? ? ?Allergies as of 11/30/2021 - Review Complete 11/30/2021  ?Allergen Reaction Noted  ? Desyrel [trazodone] Other (See Comments) 07/14/2014  ? Keflex [cephalexin] Other (See Comments) 07/14/2014  ? Klonopin [clonazepam] Other (See Comments) 07/14/2014  ? Minocin [minocycline hcl] Other (See Comments) 07/14/2014  ? Stelazine [trifluoperazine]  07/14/2014  ? Viberzi [eluxadoline]  03/31/2015  ? ? ?Family History  ?Problem Relation Age of Onset  ? Cancer Sister   ?     blood disorder ??  ? Colon cancer Neg Hx   ? ? ?Social History  ? ?Socioeconomic History  ? Marital status: Widowed  ?  Spouse name: Not on file  ? Number of children: 2  ? Years of education: Not on file  ? Highest education level: Not on file  ?Occupational History  ? Occupation: disability  ?Tobacco Use  ? Smoking status: Every Day  ?  Packs/day: 0.50  ?  Types: Cigarettes  ? Smokeless tobacco: Never  ? Tobacco comments:  ?  quit 03/31/18  ?Vaping Use  ? Vaping Use: Never used   ?Substance and Sexual Activity  ? Alcohol use: No  ?  Alcohol/week: 0.0 standard drinks  ? Drug use: No  ? Sexual activity: Not Currently  ?Other Topics Concern  ? Not on file  ?Social History Narrative  ? Not on file  ? ?Social Determinants of Health  ? ?Financial Resource Strain: Not on file  ?Food Insecurity: Not on file  ?Transportation Needs: Not on file  ?Physical Activity: Not on file  ?Stress: Not on file  ?Social Connections: Not on file  ?Intimate Partner Violence: Not on file  ? ? ?Review of Systems: ?See HPI, otherwise negative ROS ? ?Physical Exam: ?BP 133/86 (BP Location: Right Arm, Patient Position: Sitting, Cuff Size: Normal)   Pulse 99   Temp (!) 97.1 ?F (36.2 ?C) (Temporal)   Ht '5\' 4"'$  (1.626 m)   Wt 149 lb 6.4 oz (67.8 kg)   SpO2 97%   BMI 25.64 kg/m?  ?General:   Alert,  Well-developed, well-nourished, pleasant and cooperative in NAD ?Neck:  Supple; no masses or thyromegaly. No significant cervical adenopathy. ?Lungs:  Clear throughout to auscultation.   No wheezes, crackles, or rhonchi. No acute distress. ?Heart:  Regular rate and rhythm; no murmurs, clicks, rubs,  or gallops. ?Abdomen: Non-distended, normal bowel sounds.  Soft and nontender without appreciable mass or hepatosplenomegaly.  ?Pulses:  Normal pulses noted. ?Extremities:  Without clubbing or edema. ? ?Impression/Plan: 76 year old lady with chronic intermittent diarrhea.  Biopsies and TTG IgA negative in the past.  At one point she had a slightly elevated TTG IgA level at 6 - subsequently reverted to normal without intervention.  Still need to be concerned about pancreatic exocrine insufficiency.  She has osteoporosis. ?She has been taking Entocort 9 mg daily although it is not reflected on her current medication list.  She has been taking this regimen for a year now. ? ?Recommendations: ?Lets get a formed stool for fecal elastase testing ? ?Serum TTG IgA level ? ?Take Protonix 40 mg every day before breakfast.  Use famotidine  or Pepcid only as needed for breakthrough symptoms ? ?Office visit with [place] in 3 months ? ?Lan for colonoscopy 2025. ? ? ? ? ? ? ?Notice: This dictation was prepared with Dragon dictation along with smaller phrase technology.

## 2021-12-02 DIAGNOSIS — K529 Noninfective gastroenteritis and colitis, unspecified: Secondary | ICD-10-CM | POA: Diagnosis not present

## 2021-12-03 LAB — TISSUE TRANSGLUTAMINASE, IGA: (tTG) Ab, IgA: <1 U/mL

## 2021-12-06 DIAGNOSIS — K529 Noninfective gastroenteritis and colitis, unspecified: Secondary | ICD-10-CM | POA: Diagnosis not present

## 2021-12-09 DIAGNOSIS — E261 Secondary hyperaldosteronism: Secondary | ICD-10-CM | POA: Diagnosis not present

## 2021-12-09 DIAGNOSIS — E785 Hyperlipidemia, unspecified: Secondary | ICD-10-CM | POA: Diagnosis not present

## 2021-12-09 DIAGNOSIS — I509 Heart failure, unspecified: Secondary | ICD-10-CM | POA: Diagnosis not present

## 2021-12-09 DIAGNOSIS — H04129 Dry eye syndrome of unspecified lacrimal gland: Secondary | ICD-10-CM | POA: Diagnosis not present

## 2021-12-09 DIAGNOSIS — M069 Rheumatoid arthritis, unspecified: Secondary | ICD-10-CM | POA: Diagnosis not present

## 2021-12-09 DIAGNOSIS — M810 Age-related osteoporosis without current pathological fracture: Secondary | ICD-10-CM | POA: Diagnosis not present

## 2021-12-09 DIAGNOSIS — J309 Allergic rhinitis, unspecified: Secondary | ICD-10-CM | POA: Diagnosis not present

## 2021-12-09 DIAGNOSIS — Z008 Encounter for other general examination: Secondary | ICD-10-CM | POA: Diagnosis not present

## 2021-12-09 DIAGNOSIS — I110 Hypertensive heart disease with heart failure: Secondary | ICD-10-CM | POA: Diagnosis not present

## 2021-12-09 DIAGNOSIS — J449 Chronic obstructive pulmonary disease, unspecified: Secondary | ICD-10-CM | POA: Diagnosis not present

## 2021-12-09 DIAGNOSIS — E039 Hypothyroidism, unspecified: Secondary | ICD-10-CM | POA: Diagnosis not present

## 2021-12-09 DIAGNOSIS — R69 Illness, unspecified: Secondary | ICD-10-CM | POA: Diagnosis not present

## 2021-12-10 LAB — PANCREATIC ELASTASE, FECAL: Pancreatic Elastase-1, Stool: 302 mcg/g

## 2022-01-14 ENCOUNTER — Other Ambulatory Visit (HOSPITAL_COMMUNITY): Payer: Self-pay | Admitting: Internal Medicine

## 2022-01-14 DIAGNOSIS — Z1231 Encounter for screening mammogram for malignant neoplasm of breast: Secondary | ICD-10-CM

## 2022-02-08 DIAGNOSIS — J069 Acute upper respiratory infection, unspecified: Secondary | ICD-10-CM | POA: Diagnosis not present

## 2022-02-16 ENCOUNTER — Other Ambulatory Visit: Payer: Self-pay

## 2022-02-16 ENCOUNTER — Encounter (HOSPITAL_COMMUNITY): Payer: Self-pay | Admitting: Emergency Medicine

## 2022-02-16 ENCOUNTER — Emergency Department (HOSPITAL_COMMUNITY)
Admission: EM | Admit: 2022-02-16 | Discharge: 2022-02-17 | Disposition: A | Discharge status: Home / Self Care | Payer: Medicare HMO | Attending: Emergency Medicine | Admitting: Emergency Medicine

## 2022-02-16 DIAGNOSIS — Z5321 Procedure and treatment not carried out due to patient leaving prior to being seen by health care provider: Secondary | ICD-10-CM | POA: Diagnosis not present

## 2022-02-16 DIAGNOSIS — R109 Unspecified abdominal pain: Secondary | ICD-10-CM | POA: Insufficient documentation

## 2022-02-16 LAB — COMPREHENSIVE METABOLIC PANEL
ALT: 21 U/L (ref 0–44)
AST: 21 U/L (ref 15–41)
Albumin: 3.4 g/dL — ABNORMAL LOW (ref 3.5–5.0)
Alkaline Phosphatase: 65 U/L (ref 38–126)
Anion gap: 12 (ref 5–15)
BUN: 13 mg/dL (ref 8–23)
CO2: 35 mmol/L — ABNORMAL HIGH (ref 22–32)
Calcium: 8.1 mg/dL — ABNORMAL LOW (ref 8.9–10.3)
Chloride: 91 mmol/L — ABNORMAL LOW (ref 98–111)
Creatinine, Ser: 0.91 mg/dL (ref 0.44–1.00)
GFR, Estimated: >60 mL/min (ref >60)
Glucose, Bld: 130 mg/dL — ABNORMAL HIGH (ref 70–99)
Potassium: 2.9 mmol/L — ABNORMAL LOW (ref 3.5–5.1)
Sodium: 138 mmol/L (ref 135–145)
Total Bilirubin: 1.3 mg/dL — ABNORMAL HIGH (ref 0.3–1.2)
Total Protein: 6.8 g/dL (ref 6.5–8.1)

## 2022-02-16 LAB — CBC
HCT: 54.8 % — ABNORMAL HIGH (ref 36.0–46.0)
Hemoglobin: 17.7 g/dL — ABNORMAL HIGH (ref 12.0–15.0)
MCH: 30.3 pg (ref 26.0–34.0)
MCHC: 32.3 g/dL (ref 30.0–36.0)
MCV: 93.8 fL (ref 80.0–100.0)
Platelets: 309 10*3/uL (ref 150–400)
RBC: 5.84 MIL/uL — ABNORMAL HIGH (ref 3.87–5.11)
RDW: 13.3 % (ref 11.5–15.5)
WBC: 24.1 10*3/uL — ABNORMAL HIGH (ref 4.0–10.5)
nRBC: 0 % (ref 0.0–0.2)

## 2022-02-16 LAB — LIPASE, BLOOD: Lipase: 25 U/L (ref 11–51)

## 2022-02-16 NOTE — ED Triage Notes (Signed)
Pt c/o abd pain since last night. Pt denies any n/v/d.

## 2022-02-17 ENCOUNTER — Ambulatory Visit (HOSPITAL_COMMUNITY)
Admission: RE | Admit: 2022-02-17 | Discharge: 2022-02-17 | Disposition: A | Discharge status: Home / Self Care | Payer: Medicare HMO | Source: Ambulatory Visit | Attending: Emergency Medicine | Admitting: Emergency Medicine

## 2022-02-17 ENCOUNTER — Emergency Department (HOSPITAL_COMMUNITY)
Admission: EM | Admit: 2022-02-17 | Discharge: 2022-02-17 | Discharge status: Against Medical Advice | Payer: Medicare HMO

## 2022-02-17 DIAGNOSIS — R52 Pain, unspecified: Secondary | ICD-10-CM | POA: Diagnosis not present

## 2022-02-17 DIAGNOSIS — R1084 Generalized abdominal pain: Secondary | ICD-10-CM | POA: Diagnosis not present

## 2022-02-17 DIAGNOSIS — Z743 Need for continuous supervision: Secondary | ICD-10-CM | POA: Diagnosis not present

## 2022-02-17 MED ORDER — IOHEXOL 300 MG/ML  SOLN: 100.0000 mL | Freq: Once | INTRAMUSCULAR | Status: DC | PRN

## 2022-02-17 NOTE — ED Triage Notes (Signed)
Pt arrives BIB [facility] c/o lower abd pain since Monday night.

## 2022-02-21 DIAGNOSIS — R110 Nausea: Secondary | ICD-10-CM | POA: Diagnosis not present

## 2022-02-21 DIAGNOSIS — R945 Abnormal results of liver function studies: Secondary | ICD-10-CM | POA: Diagnosis not present

## 2022-02-21 DIAGNOSIS — R109 Unspecified abdominal pain: Secondary | ICD-10-CM | POA: Diagnosis not present

## 2022-02-21 DIAGNOSIS — Z7689 Persons encountering health services in other specified circumstances: Secondary | ICD-10-CM | POA: Diagnosis not present

## 2022-02-21 DIAGNOSIS — R399 Unspecified symptoms and signs involving the genitourinary system: Secondary | ICD-10-CM | POA: Diagnosis not present

## 2022-02-22 ENCOUNTER — Other Ambulatory Visit (HOSPITAL_COMMUNITY): Payer: Self-pay | Admitting: Family Medicine

## 2022-02-22 ENCOUNTER — Other Ambulatory Visit: Payer: Self-pay | Admitting: Family Medicine

## 2022-02-22 DIAGNOSIS — R748 Abnormal levels of other serum enzymes: Secondary | ICD-10-CM

## 2022-02-28 ENCOUNTER — Other Ambulatory Visit: Payer: Self-pay | Admitting: Family Medicine

## 2022-02-28 ENCOUNTER — Other Ambulatory Visit (HOSPITAL_COMMUNITY): Payer: Self-pay | Admitting: Family Medicine

## 2022-02-28 DIAGNOSIS — R69 Illness, unspecified: Secondary | ICD-10-CM | POA: Diagnosis not present

## 2022-02-28 DIAGNOSIS — Z6828 Body mass index (BMI) 28.0-28.9, adult: Secondary | ICD-10-CM | POA: Diagnosis not present

## 2022-02-28 DIAGNOSIS — K644 Residual hemorrhoidal skin tags: Secondary | ICD-10-CM | POA: Diagnosis not present

## 2022-02-28 DIAGNOSIS — R109 Unspecified abdominal pain: Secondary | ICD-10-CM

## 2022-02-28 DIAGNOSIS — E663 Overweight: Secondary | ICD-10-CM | POA: Diagnosis not present

## 2022-02-28 DIAGNOSIS — J449 Chronic obstructive pulmonary disease, unspecified: Secondary | ICD-10-CM | POA: Diagnosis not present

## 2022-02-28 DIAGNOSIS — R159 Full incontinence of feces: Secondary | ICD-10-CM | POA: Diagnosis not present

## 2022-03-01 ENCOUNTER — Ambulatory Visit (HOSPITAL_COMMUNITY)
Admission: RE | Admit: 2022-03-01 | Discharge: 2022-03-01 | Disposition: A | Discharge status: Home / Self Care | Payer: Medicare HMO | Source: Ambulatory Visit | Attending: Family Medicine | Admitting: Family Medicine

## 2022-03-01 DIAGNOSIS — R109 Unspecified abdominal pain: Secondary | ICD-10-CM | POA: Diagnosis not present

## 2022-03-01 DIAGNOSIS — K6389 Other specified diseases of intestine: Secondary | ICD-10-CM | POA: Diagnosis not present

## 2022-03-01 DIAGNOSIS — K5730 Diverticulosis of large intestine without perforation or abscess without bleeding: Secondary | ICD-10-CM | POA: Diagnosis not present

## 2022-03-01 DIAGNOSIS — K7689 Other specified diseases of liver: Secondary | ICD-10-CM | POA: Diagnosis not present

## 2022-03-01 MED ORDER — IOHEXOL 300 MG/ML  SOLN
100.0000 mL | Freq: Once | INTRAMUSCULAR | Status: AC | PRN
  Administered 2022-03-01: 100 mL via INTRAVENOUS

## 2022-03-02 ENCOUNTER — Ambulatory Visit (INDEPENDENT_AMBULATORY_CARE_PROVIDER_SITE_OTHER): Payer: Medicare HMO | Admitting: Gastroenterology

## 2022-03-02 ENCOUNTER — Encounter: Payer: Self-pay | Admitting: Gastroenterology

## 2022-03-02 VITALS — BP 105/75 | HR 97 | Temp 97.8°F | Ht 64.0 in | Wt 142.9 lb

## 2022-03-02 DIAGNOSIS — R7989 Other specified abnormal findings of blood chemistry: Secondary | ICD-10-CM

## 2022-03-02 DIAGNOSIS — K5732 Diverticulitis of large intestine without perforation or abscess without bleeding: Secondary | ICD-10-CM | POA: Insufficient documentation

## 2022-03-02 MED ORDER — KETOCONAZOLE 2 % EX CREA: 1.0000 | TOPICAL_CREAM | Freq: Two times a day (BID) | CUTANEOUS | 0 refills | Status: DC

## 2022-03-02 MED ORDER — HYDROCORTISONE (PERIANAL) 2.5 % EX CREA: 1.0000 | TOPICAL_CREAM | Freq: Two times a day (BID) | CUTANEOUS | 1 refills | Status: DC

## 2022-03-02 NOTE — Patient Instructions (Addendum)
Please have blood work done.  I am glad your PCP has sent in antibiotics for you!  I have sent in Anusol cream to use per rectum twice a day for hemorrhoids. I also sent in an anti-fungal (ketoconazole) to use between buttock folds twice a day.  We will see you in 4 weeks! Please call if no improvement!  You can follow a low fiber diet until you start feeling better, then can increase back to normal foods.   I enjoyed seeing you again today! As you know, I value our relationship and want to provide genuine, compassionate, and quality care. I welcome your feedback. If you receive a survey regarding your visit,  I greatly appreciate you taking time to fill this out. See you next time!  Annitta Needs, PhD, ANP-BC [facility]

## 2022-03-02 NOTE — Progress Notes (Signed)
Gastroenterology Office Note     Primary Care Physician:  Celene Squibb, MD  Primary Gastroenterologist: Dr. Gala Romney    Chief Complaint   Chief Complaint  Patient presents with   Follow-up    Patient here today for a follow up visit, but states she has some lower abdominal pain. She had  a ct scan yesterday ordered by Dr. Nevada Crane which showed IMPRESSION: Diverticulosis of colon. There is asymmetric wall thickening in sigmoid colon in left side of pelvis. There is minimal pericolic stranding adjacent to the sigmoid colon. Findings may be due to sigmoid diverticulitis or neoplastic process in sigmoid colon. Short-term follow-up endoscopy should be considered to rule out neoplastic process.      History of Present Illness   Jennifer [name] is a 76 y.o. female presenting today in follow-up with a history of chronic diarrhea, distant history of lymphocytic colitis, negative celiac evaluation, last colonoscopy 2020 with tubular adenomas. Surveillance due in 5 years. Fecal elastase normal. She has been taking Entocort 9 mg chronically.   CT abd/pelvis with contrast yesterday with asymmetric wall thickening in sigmoid colon, minimal pericolic stranding adjacent to the sigmoid colon, query diverticulitis or neoplastic process. Liver cysts without change from prior. Medication has been called in per PCP to treat. Rectal bleeding and soreness from multiple loose stools after taking CT contrast.   A few weeks ago started having abdominal pain. She will start her antibiotics today.   Notable hypokalemia several weeks ago. Needs recheck now.    Past Medical History:  Diagnosis Date   Depression    GERD (gastroesophageal reflux disease)    History of colon polyps    remote past, unable to retrieve records    Hyperlipidemia    Hypothyroidism    Lymphocytic colitis    Rheumatoid arthritis [facility])     Past Surgical History:  Procedure Laterality Date   COLONOSCOPY     last one was about 3  yrs in [facility]   COLONOSCOPY N/A 02/19/2016   Dr. Gala Romney: three 3-6 mm polyps (tubular adenomas) in ascending colon, segmental biopsies consistent with lymphocytic colitis. Prescribed entocort.    COLONOSCOPY N/A 03/06/2019   Dr. Gala Romney: 2 polyps removed, tubular adenomas, diverticulosis.  NO random colon biopsies performed.  Next colonoscopy 5 years.   ESOPHAGOGASTRODUODENOSCOPY N/A 08/11/2014   RMR: MIld erosive  reflux esophagitis. small hiatal hernia. Abnormal gastric muocsa uncertain significance. Subtly abnormal duodeanl (bulbar) mucosa- status post multiple biopsies. BENIGN small bowel mucosa, no evidence of villous blulnting, mild reactive gastropathy on stomach biopsy    ESOPHAGOGASTRODUODENOSCOPY (EGD) WITH PROPOFOL N/A 05/31/2021   Procedure: ESOPHAGOGASTRODUODENOSCOPY (EGD) WITH PROPOFOL;  Surgeon: Daneil Dolin, MD;  [facility];  Service: Endoscopy;  Laterality: N/A;  10:30am   GALLBLADDER SURGERY     LEG SURGERY     right   MALONEY DILATION N/A 05/31/2021   Procedure: MALONEY DILATION;  Surgeon: Daneil Dolin, MD;  Location: [facility];  Service: Endoscopy;  Laterality: N/A;   PARTIAL HYSTERECTOMY      Current Outpatient Medications  Medication Sig Dispense Refill   BEVESPI AEROSPHERE 9-4.8 MCG/ACT AERO Inhale 1 puff into the lungs in the morning and at bedtime.     Budeson-Glycopyrrol-Formoterol (BREZTRI AEROSPHERE) 160-9-4.8 MCG/ACT AERO Inhale 1 puff into the lungs 2 (two) times daily.     budesonide (ENTOCORT EC) 3 MG 24 hr capsule Take 9 mg by mouth daily.     Calcium Carbonate-Vit D-Min (CALCIUM 600+D3  PLUS MINERALS) 600-800 MG-UNIT CHEW Chew 1 tablet by mouth in the morning and at bedtime.     cycloSPORINE (RESTASIS) 0.05 % ophthalmic emulsion 1 drop 2 (two) times daily.     dicyclomine (BENTYL) 10 MG capsule Take ONE capsule by MOUTH FOUR times daily - BEFORE meals AND AT bedtime. (Patient taking differently: 10 mg.) 120 capsule 3   famotidine  (PEPCID) 20 MG tablet Take 20 mg by mouth 2 (two) times daily as needed (itching.).     hydrOXYzine (VISTARIL) 25 MG capsule Take 25 mg by mouth daily as needed (severe itching).     Levothyroxine Sodium 50 MCG CAPS Take 50 mcg by mouth daily before breakfast.     methocarbamol (ROBAXIN) 500 MG tablet Take 500 mg by mouth every 6 (six) hours as needed for muscle spasms.     mirabegron ER (MYRBETRIQ) 25 MG TB24 tablet Take 25 mg by mouth at bedtime.     ondansetron (ZOFRAN) 4 MG tablet Take 4 mg by mouth 2 (two) times daily.     pantoprazole (PROTONIX) 40 MG tablet TAKE ONE TABLET BY MOUTH ONCE DAILY BEFORE BREAKFAST. (Patient taking differently: Take 40 mg by mouth daily before breakfast.) 90 tablet 3   potassium chloride SA (KLOR-CON M) 20 MEQ tablet Take 20 mEq by mouth daily.     PROLIA 60 MG/ML SOSY injection Inject 60 mg into the skin every 6 (six) months.     simvastatin (ZOCOR) 40 MG tablet Take 40 mg by mouth every evening.     No current facility-administered medications for this visit.    Allergies as of 03/02/2022 - Review Complete 03/02/2022  Allergen Reaction Noted   Desyrel [trazodone] Other (See Comments) 07/14/2014   Keflex [cephalexin] Other (See Comments) 07/14/2014   Klonopin [clonazepam] Other (See Comments) 07/14/2014   Minocin [minocycline hcl] Other (See Comments) 07/14/2014   Stelazine [trifluoperazine]  07/14/2014   Viberzi [eluxadoline]  03/31/2015    Family History  Problem Relation Age of Onset   Cancer Sister        blood disorder ??   Colon cancer Neg Hx     Social History   Socioeconomic History   Marital status: Widowed    Spouse name: Not on file   Number of children: 2   Years of education: Not on file   Highest education level: Not on file  Occupational History   Occupation: disability  Tobacco Use   Smoking status: Every Day    Packs/day: 0.25    Types: Cigarettes   Smokeless tobacco: Never   Tobacco comments:    quit 03/31/18  Vaping  Use   Vaping Use: Never used  Substance and Sexual Activity   Alcohol use: No    Alcohol/week: 0.0 standard drinks of alcohol   Drug use: No   Sexual activity: Not Currently  Other Topics Concern   Not on file  Social History Narrative   Not on file   Social Determinants of Health   Financial Resource Strain: Not on file  Food Insecurity: Not on file  Transportation Needs: Not on file  Physical Activity: Not on file  Stress: Not on file  Social Connections: Not on file  Intimate Partner Violence: Not on file     Review of Systems   Gen: Denies any fever, chills, fatigue, weight loss, lack of appetite.  CV: Denies chest pain, heart palpitations, peripheral edema, syncope.  Resp: Denies shortness of breath at rest or with exertion. Denies wheezing or  cough.  GI: see HPI GU : Denies urinary burning, urinary frequency, urinary hesitancy MS: Denies joint pain, muscle weakness, cramps, or limitation of movement.  Derm: Denies rash, itching, dry skin Psych: Denies depression, anxiety, memory loss, and confusion Heme: see HPI   Physical Exam   BP 105/75 (BP Location: Left Arm, Patient Position: Sitting, Cuff Size: Small)   Pulse 97   Temp 97.8 F (36.6 C) (Oral)   Ht '5\' 4"'$  (1.626 m)   Wt 142 lb 14.4 oz (64.8 kg)   BMI 24.53 kg/m  General:   Alert and oriented. Pleasant and cooperative. Well-nourished and well-developed.  Head:  Normocephalic and atraumatic. Eyes:  Without icterus Abdomen:  +BS, soft, mild TTP LLQ and non-distended. No HSM noted. No guarding or rebound. No masses appreciated.  Rectal:  external hemorrhoids, non-thrombosed. No DRE. Erythema between buttocks, well demarcated, consistent with yeast Msk:  Symmetrical without gross deformities. Normal posture. Extremities:  Without edema. Neurologic:  Alert and  oriented x4;  grossly normal neurologically. Skin:  Intact without significant lesions or rashes. Psych:  Alert and cooperative. Normal mood and  affect.   Assessment   Jennifer [name] is a 76 y.o. female presenting today in follow-up with a history of chronic diarrhea, distant history of lymphocytic colitis, negative celiac evaluation, last colonoscopy 2020 with tubular adenomas. Surveillance due in 5 years. Fecal elastase normal. She has been taking Entocort 9 mg chronically.   Now with abdominal pain and CT yesterday with asymmetric wall thickening in sigmoid colon, minimal pericolic stranding adjacent to the sigmoid colon, query diverticulitis or neoplastic process. Liver cysts without change from prior. Medication has been called in per PCP to treat, and patient will be picking up today.   Hemorrhoids have flared after multiple stools following oral contrast at time of CT. Rectal exam completed with concerns for yeast as well.   Notable hypokalemia on labs without known recheck.      PLAN    Antibiotics per PCP prescription Anusol per rectum. Ketoconazole also sent to pharmacy CMP now Return in 4 weeks. Will arrange colonoscopy at that time Call if no improvement    Annitta Needs, PhD, [facility] Gastroenterology

## 2022-03-03 ENCOUNTER — Encounter: Payer: Self-pay | Admitting: *Deleted

## 2022-03-03 ENCOUNTER — Encounter (HOSPITAL_COMMUNITY): Payer: Self-pay

## 2022-03-03 ENCOUNTER — Ambulatory Visit (HOSPITAL_COMMUNITY): Payer: Medicare HMO

## 2022-03-03 ENCOUNTER — Other Ambulatory Visit: Payer: Self-pay

## 2022-03-03 DIAGNOSIS — K219 Gastro-esophageal reflux disease without esophagitis: Secondary | ICD-10-CM

## 2022-03-03 DIAGNOSIS — K529 Noninfective gastroenteritis and colitis, unspecified: Secondary | ICD-10-CM

## 2022-03-03 DIAGNOSIS — R1310 Dysphagia, unspecified: Secondary | ICD-10-CM

## 2022-03-03 LAB — COMPLETE METABOLIC PANEL WITH GFR
AG Ratio: 1.1 (calc) (ref 1.0–2.5)
ALT: 24 U/L (ref 6–29)
AST: 30 U/L (ref 10–35)
Albumin: 3.1 g/dL — ABNORMAL LOW (ref 3.6–5.1)
Alkaline phosphatase (APISO): 82 U/L (ref 37–153)
BUN: 10 mg/dL (ref 7–25)
CO2: 31 mmol/L (ref 20–32)
Calcium: 8.5 mg/dL — ABNORMAL LOW (ref 8.6–10.4)
Chloride: 101 mmol/L (ref 98–110)
Creat: 0.85 mg/dL (ref 0.60–1.00)
Globulin: 2.7 g/dL (calc) (ref 1.9–3.7)
Glucose, Bld: 83 mg/dL (ref 65–99)
Potassium: 2.9 mmol/L — ABNORMAL LOW (ref 3.5–5.3)
Sodium: 145 mmol/L (ref 135–146)
Total Bilirubin: 0.5 mg/dL (ref 0.2–1.2)
Total Protein: 5.8 g/dL — ABNORMAL LOW (ref 6.1–8.1)
eGFR: 71 mL/min/{1.73_m2} (ref >=60)

## 2022-03-03 MED ORDER — POTASSIUM CHLORIDE CRYS ER 20 MEQ PO TBCR: 40.0000 meq | EXTENDED_RELEASE_TABLET | Freq: Two times a day (BID) | ORAL | 0 refills | Status: DC

## 2022-03-17 DIAGNOSIS — M79672 Pain in left foot: Secondary | ICD-10-CM | POA: Diagnosis not present

## 2022-03-17 DIAGNOSIS — M79675 Pain in left toe(s): Secondary | ICD-10-CM | POA: Diagnosis not present

## 2022-03-17 DIAGNOSIS — L110 Acquired keratosis follicularis: Secondary | ICD-10-CM | POA: Diagnosis not present

## 2022-03-17 DIAGNOSIS — I739 Peripheral vascular disease, unspecified: Secondary | ICD-10-CM | POA: Diagnosis not present

## 2022-03-17 DIAGNOSIS — M79674 Pain in right toe(s): Secondary | ICD-10-CM | POA: Diagnosis not present

## 2022-03-17 DIAGNOSIS — M79671 Pain in right foot: Secondary | ICD-10-CM | POA: Diagnosis not present

## 2022-03-17 DIAGNOSIS — M779 Enthesopathy, unspecified: Secondary | ICD-10-CM | POA: Diagnosis not present

## 2022-03-18 DIAGNOSIS — R600 Localized edema: Secondary | ICD-10-CM | POA: Diagnosis not present

## 2022-03-23 DIAGNOSIS — M1990 Unspecified osteoarthritis, unspecified site: Secondary | ICD-10-CM | POA: Diagnosis not present

## 2022-03-23 DIAGNOSIS — M7751 Other enthesopathy of right foot: Secondary | ICD-10-CM | POA: Diagnosis not present

## 2022-03-23 DIAGNOSIS — M79671 Pain in right foot: Secondary | ICD-10-CM | POA: Diagnosis not present

## 2022-03-31 ENCOUNTER — Ambulatory Visit (INDEPENDENT_AMBULATORY_CARE_PROVIDER_SITE_OTHER): Payer: Medicare HMO | Admitting: Gastroenterology

## 2022-03-31 ENCOUNTER — Encounter: Payer: Self-pay | Admitting: Gastroenterology

## 2022-03-31 VITALS — BP 141/80 | HR 85 | Temp 97.3°F | Ht 64.0 in | Wt 149.8 lb

## 2022-03-31 DIAGNOSIS — Z8719 Personal history of other diseases of the digestive system: Secondary | ICD-10-CM | POA: Diagnosis not present

## 2022-03-31 DIAGNOSIS — K529 Noninfective gastroenteritis and colitis, unspecified: Secondary | ICD-10-CM | POA: Diagnosis not present

## 2022-03-31 MED ORDER — BISMUTH SUBSALICYLATE 262 MG PO CHEW: CHEWABLE_TABLET | ORAL | 1 refills | Status: DC

## 2022-03-31 NOTE — Progress Notes (Signed)
Gastroenterology Office Note     Primary Care Physician:  Celene Squibb, MD  Primary Gastroenterologist: Dr. Gala Romney    Chief Complaint   Chief Complaint  Patient presents with   Diarrhea     History of Present Illness   Jennifer [name] is a 76 y.o. female presenting today in follow-up with a history of chronic diarrhea, distant history of lymphocytic colitis, negative celiac evaluation, last colonoscopy 2020 with tubular adenomas. Fecal elastase normal. Negative small bowel biopsies. Abnormal CT 03/01/2022 with asymmetric wall thickening in sigmoid colon, minimal pericolic stranding adjacent to the sigmoid colon, query diverticulitis or neoplastic process. Liver cysts without change from prior. Treated with abx.   Returns in follow-up today to arrange colonoscopy. No abdominal pain. No worsening of diarrhea since being on antibiotics. Fecal incontinence with gas. Entocort 9 mg daily chronically through PCP. Arthritis worsened when off Entocort. She has had new onset lower extremity edema being managed by PCP. Entocort does not seem to be helping. Chronic smoking.   Still on pantoprazole. No reflux.  States diarrhea flared after cholecystectomy years ago.      Past Medical History:  Diagnosis Date   Depression    GERD (gastroesophageal reflux disease)    History of colon polyps    remote past, unable to retrieve records    Hyperlipidemia    Hypothyroidism    Lymphocytic colitis    Rheumatoid arthritis [facility])     Past Surgical History:  Procedure Laterality Date   COLONOSCOPY     last one was about 3 yrs in [facility]   COLONOSCOPY N/A 02/19/2016   Dr. Gala Romney: three 3-6 mm polyps (tubular adenomas) in ascending colon, segmental biopsies consistent with lymphocytic colitis. Prescribed entocort.    COLONOSCOPY N/A 03/06/2019   Dr. Gala Romney: 2 polyps removed, tubular adenomas, diverticulosis.  NO random colon biopsies performed.  Next colonoscopy 5 years.    ESOPHAGOGASTRODUODENOSCOPY N/A 08/11/2014   RMR: MIld erosive  reflux esophagitis. small hiatal hernia. Abnormal gastric muocsa uncertain significance. Subtly abnormal duodeanl (bulbar) mucosa- status post multiple biopsies. BENIGN small bowel mucosa, no evidence of villous blulnting, mild reactive gastropathy on stomach biopsy    ESOPHAGOGASTRODUODENOSCOPY (EGD) WITH PROPOFOL N/A 05/31/2021   Procedure: ESOPHAGOGASTRODUODENOSCOPY (EGD) WITH PROPOFOL;  Surgeon: Daneil Dolin, MD;  [facility];  Service: Endoscopy;  Laterality: N/A;  10:30am   GALLBLADDER SURGERY     LEG SURGERY     right   MALONEY DILATION N/A 05/31/2021   Procedure: MALONEY DILATION;  Surgeon: Daneil Dolin, MD;  [facility];  Service: Endoscopy;  Laterality: N/A;   PARTIAL HYSTERECTOMY      Current Outpatient Medications  Medication Sig Dispense Refill   BEVESPI AEROSPHERE 9-4.8 MCG/ACT AERO Inhale 1 puff into the lungs in the morning and at bedtime.     Budeson-Glycopyrrol-Formoterol (BREZTRI AEROSPHERE) 160-9-4.8 MCG/ACT AERO Inhale 1 puff into the lungs 2 (two) times daily.     budesonide (ENTOCORT EC) 3 MG 24 hr capsule Take 9 mg by mouth daily.     Calcium Carbonate-Vit D-Min (CALCIUM 600+D3 PLUS MINERALS) 600-800 MG-UNIT CHEW Chew 1 tablet by mouth in the morning and at bedtime.     cycloSPORINE (RESTASIS) 0.05 % ophthalmic emulsion 1 drop 2 (two) times daily.     dicyclomine (BENTYL) 10 MG capsule Take ONE capsule by MOUTH FOUR times daily - BEFORE meals AND AT bedtime. (Patient taking differently: 10 mg.) 120 capsule 3   famotidine (PEPCID)  20 MG tablet Take 20 mg by mouth 2 (two) times daily as needed (itching.).     furosemide (LASIX) 20 MG tablet Take 20 mg by mouth daily.     hydrocortisone (ANUSOL-HC) 2.5 % rectal cream Place 1 Application rectally 2 (two) times daily. To rectum for hemorrhoid discomfort or bleeding 30 g 1   hydrOXYzine (VISTARIL) 25 MG capsule Take 25 mg by mouth  daily as needed (severe itching).     ipratropium-albuterol (DUONEB) 0.5-2.5 (3) MG/3ML SOLN Take by nebulization.     ketoconazole (NIZORAL) 2 % cream Apply 1 Application topically 2 (two) times daily. To between buttocks folds for yeast. 15 g 0   Levothyroxine Sodium 50 MCG CAPS Take 50 mcg by mouth daily before breakfast.     methocarbamol (ROBAXIN) 500 MG tablet Take 500 mg by mouth every 6 (six) hours as needed for muscle spasms.     mirabegron ER (MYRBETRIQ) 25 MG TB24 tablet Take 25 mg by mouth at bedtime.     ondansetron (ZOFRAN) 4 MG tablet Take 4 mg by mouth 2 (two) times daily.     pantoprazole (PROTONIX) 40 MG tablet TAKE ONE TABLET BY MOUTH ONCE DAILY BEFORE BREAKFAST. (Patient taking differently: Take 40 mg by mouth daily before breakfast.) 90 tablet 3   potassium chloride SA (KLOR-CON M) 20 MEQ tablet Take 20 mEq by mouth daily.     potassium chloride SA (KLOR-CON M) 20 MEQ tablet Take 2 tablets (40 mEq total) by mouth 2 (two) times daily for 3 days. 12 tablet 0   PROLIA 60 MG/ML SOSY injection Inject 60 mg into the skin every 6 (six) months.     simvastatin (ZOCOR) 40 MG tablet Take 40 mg by mouth every evening.     No current facility-administered medications for this visit.    Allergies as of 03/31/2022 - Review Complete 03/31/2022  Allergen Reaction Noted   Desyrel [trazodone] Other (See Comments) 07/14/2014   Keflex [cephalexin] Other (See Comments) 07/14/2014   Klonopin [clonazepam] Other (See Comments) 07/14/2014   Minocin [minocycline hcl] Other (See Comments) 07/14/2014   Stelazine [trifluoperazine]  07/14/2014   Viberzi [eluxadoline]  03/31/2015    Family History  Problem Relation Age of Onset   Cancer Sister        blood disorder ??   Colon cancer Neg Hx     Social History   Socioeconomic History   Marital status: Widowed    Spouse name: Not on file   Number of children: 2   Years of education: Not on file   Highest education level: Not on file   Occupational History   Occupation: disability  Tobacco Use   Smoking status: Every Day    Packs/day: 0.25    Types: Cigarettes   Smokeless tobacco: Never   Tobacco comments:    quit 03/31/18  Vaping Use   Vaping Use: Never used  Substance and Sexual Activity   Alcohol use: No    Alcohol/week: 0.0 standard drinks of alcohol   Drug use: No   Sexual activity: Not Currently  Other Topics Concern   Not on file  Social History Narrative   Not on file   Social Determinants of Health   Financial Resource Strain: Not on file  Food Insecurity: Not on file  Transportation [name]: Not on file  Physical Activity: Not on file  Stress: Not on file  Social Connections: Not on file  Intimate Partner Violence: Not on file     Review  of Systems   Gen: Denies any fever, chills, fatigue, weight loss, lack of appetite.  CV: Denies chest pain, heart palpitations, peripheral edema, syncope.  Resp: Denies shortness of breath at rest or with exertion. Denies wheezing or cough.  GI: see HPI GU : Denies urinary burning, urinary frequency, urinary hesitancy MS: Denies joint pain, muscle weakness, cramps, or limitation of movement.  Derm: Denies rash, itching, dry skin Psych: Denies depression, anxiety, memory loss, and confusion Heme: Denies bruising, bleeding, and enlarged lymph nodes.   Physical Exam   BP (!) 141/80 (BP Location: Left Arm, Patient Position: Sitting, Cuff Size: Normal)   Pulse 85   Temp (!) 97.3 F (36.3 C) (Temporal)   Ht '5\' 4"'$  (1.626 m)   Wt 149 lb 12.8 oz (67.9 kg)   SpO2 99%   BMI 25.71 kg/m  General:   Alert and oriented. Pleasant and cooperative. Well-nourished and well-developed.  Head:  Normocephalic and atraumatic. Eyes:  Without icterus Cardiac: S1 S2 present without murmurs Lungs: scattered rhonchi, no wheezing Abdomen:  +BS, soft, non-tender and non-distended. No HSM noted. No guarding or rebound. No masses appreciated.  Rectal:  Deferred  Msk:   Symmetrical without gross deformities. Normal posture. Extremities:  Without edema. Neurologic:  Alert and  oriented x4;  grossly normal neurologically. Skin:  Intact without significant lesions or rashes. Psych:  Alert and cooperative. Normal mood and affect.   Assessment   TSERING LEAMAN is a 76 y.o. female presenting today in follow-up with a history of  chronic diarrhea, distant history of lymphocytic colitis, abnormal CT 03/01/2022 with asymmetric wall thickening in sigmoid colon, minimal pericolic stranding adjacent to the sigmoid colon, query diverticulitis or neoplastic process.   Diverticulitis: improved s/p antibiotics. Last colonoscopy in 2020 with tubular adenomas. As colonoscopy 3 years ago, recommend early interval colonoscopy.   Chronic diarrhea: lymphocytic colitis in 2017. She has been on Entocort 9 mg long-term through PCP. This is not helping at this point. Would ideally not be on this chronically. As not helpful, I have provided her with instructions for a taper. We can then try off-label Pepto bismol 3 tablets TID for 8 weeks. Previously negative celiac evaluation, fecal elastase normal. I suspect also has bile salt diarrhea contributing, as she notes onset of diarrhea in remote past s/p cholecystectomy. Multifactorial etiologies. Will trial Colestid or similar if no improvement with pepto. I have also asked her to stop pantoprazole once off of entocort, and recommend smoking cessation.     PLAN    Proceed with colonoscopy by Dr. Gala Romney in near future: the risks, benefits, and alternatives have been discussed with the patient in detail. The patient states understanding and desires to proceed. Consider updated random colonic biopsies. Entocort taper. Once off, stop pantoprazole Pepto Bismol 3 tablets TID for 8 weeks, off label Consider Colestid Even with fecal elastase normal, could empirically trial pancreatic enzymes  Return in 3 months for close follow-up   Jennifer Needs, PhD, ANP-BC [facility] Gastroenterology

## 2022-03-31 NOTE — Patient Instructions (Addendum)
Let's taper off Entocort. Start taking 2 capsules a day for 4 weeks, then 1 capsule a day for 2 weeks, then stop.    Continue pantoprazole while on the Entocort. When you stop the Entocort, you can stop pantoprazole.  We are going to try pepto bismol to help with diarrhea. 3 tablets three times a day (total of 9 tablets a day) for 8 weeks.  We are arranging a colonoscopy in the near future with Dr. Gala Romney.   I will see you in 3 months!  I enjoyed seeing you again today! As you know, I value our relationship and want to provide genuine, compassionate, and quality care. I welcome your feedback. If you receive a survey regarding your visit,  I greatly appreciate you taking time to fill this out. See you next time!  Annitta Needs, PhD, ANP-BC [facility] Gastroenterology

## 2022-04-03 ENCOUNTER — Other Ambulatory Visit: Payer: Self-pay | Admitting: Gastroenterology

## 2022-04-03 DIAGNOSIS — R197 Diarrhea, unspecified: Secondary | ICD-10-CM

## 2022-04-06 ENCOUNTER — Telehealth: Payer: Self-pay | Admitting: Cardiology

## 2022-04-06 DIAGNOSIS — R0609 Other forms of dyspnea: Secondary | ICD-10-CM | POA: Diagnosis not present

## 2022-04-06 DIAGNOSIS — Z23 Encounter for immunization: Secondary | ICD-10-CM | POA: Diagnosis not present

## 2022-04-06 DIAGNOSIS — M25512 Pain in left shoulder: Secondary | ICD-10-CM | POA: Diagnosis not present

## 2022-04-06 DIAGNOSIS — W19XXXA Unspecified fall, initial encounter: Secondary | ICD-10-CM | POA: Diagnosis not present

## 2022-04-06 DIAGNOSIS — M7989 Other specified soft tissue disorders: Secondary | ICD-10-CM | POA: Diagnosis not present

## 2022-04-06 NOTE — Telephone Encounter (Signed)
Pt c/o swelling: STAT is pt has developed SOB within 24 hours  If swelling, where is the swelling located? Feet, ankles, & legs  How much weight have you gained and in what time span? 5 lbs in about two weeks   Have you gained 3 pounds in a day or 5 pounds in a week? No   Do you have a log of your daily weights (if so, list)? No   Are you currently taking a fluid pill? Yes  Are you currently SOB? No   Have you traveled recently? No    Patient was advised by PCP to be seen for leg swelling.

## 2022-04-06 NOTE — Telephone Encounter (Signed)
Patient saw pcp today after she fell off 3 step ladder and injured her shoulder. Patient states her pcp told her to call us because her ankles appeared swollen. Her weight was 149.8 lbs - last ov her weight was 149 lbs  She does not weigh self because her scale is in kilograms but has a family member who is going to change it for her.She wears compression stockings.She takes Lasix 20 mg qd     I will FYI Dr.Branch

## 2022-04-07 DIAGNOSIS — M79671 Pain in right foot: Secondary | ICD-10-CM | POA: Diagnosis not present

## 2022-04-07 DIAGNOSIS — R600 Localized edema: Secondary | ICD-10-CM | POA: Diagnosis not present

## 2022-04-07 DIAGNOSIS — M19012 Primary osteoarthritis, left shoulder: Secondary | ICD-10-CM | POA: Diagnosis not present

## 2022-04-07 DIAGNOSIS — M25512 Pain in left shoulder: Secondary | ICD-10-CM | POA: Diagnosis not present

## 2022-04-07 DIAGNOSIS — M542 Cervicalgia: Secondary | ICD-10-CM | POA: Diagnosis not present

## 2022-04-07 DIAGNOSIS — M7989 Other specified soft tissue disorders: Secondary | ICD-10-CM | POA: Diagnosis not present

## 2022-04-07 NOTE — Telephone Encounter (Signed)
I spoke with patients pharmacy and they said Jennifer [name], from [place] office switched patient to lasix on 03/21/22   Patient says when she takes Torsemide 40 mg she gets too dehydrated and gets leg cramps so they switched her to lasix. She confirmed she takes potassium 20 meq qd

## 2022-04-07 NOTE — Telephone Encounter (Signed)
She was doing well on torsemide when we last say her last year, who and when was she changed to lasix '20mg'$   Jennifer Abts MD

## 2022-04-12 DIAGNOSIS — E7800 Pure hypercholesterolemia, unspecified: Secondary | ICD-10-CM | POA: Diagnosis not present

## 2022-04-12 DIAGNOSIS — M1990 Unspecified osteoarthritis, unspecified site: Secondary | ICD-10-CM | POA: Diagnosis not present

## 2022-04-12 DIAGNOSIS — M79671 Pain in right foot: Secondary | ICD-10-CM | POA: Diagnosis not present

## 2022-04-12 DIAGNOSIS — E039 Hypothyroidism, unspecified: Secondary | ICD-10-CM | POA: Diagnosis not present

## 2022-04-12 DIAGNOSIS — M25579 Pain in unspecified ankle and joints of unspecified foot: Secondary | ICD-10-CM | POA: Diagnosis not present

## 2022-04-12 DIAGNOSIS — R7301 Impaired fasting glucose: Secondary | ICD-10-CM | POA: Diagnosis not present

## 2022-04-13 NOTE — Telephone Encounter (Signed)
If ongoing swelling can increase lasix to '40mg'$  daily x 3 days and then update [place] again please   Zandra Abts MD

## 2022-04-14 NOTE — Telephone Encounter (Signed)
Patients listed number says phone not in service

## 2022-04-15 NOTE — Telephone Encounter (Signed)
Attempt to reach,says phone not in service. I will close this encounter.

## 2022-04-19 ENCOUNTER — Other Ambulatory Visit (HOSPITAL_COMMUNITY): Payer: Self-pay | Admitting: Family Medicine

## 2022-04-19 ENCOUNTER — Other Ambulatory Visit: Payer: Self-pay | Admitting: Family Medicine

## 2022-04-19 DIAGNOSIS — N3281 Overactive bladder: Secondary | ICD-10-CM | POA: Insufficient documentation

## 2022-04-19 DIAGNOSIS — F17200 Nicotine dependence, unspecified, uncomplicated: Secondary | ICD-10-CM | POA: Insufficient documentation

## 2022-04-19 DIAGNOSIS — R7303 Prediabetes: Secondary | ICD-10-CM | POA: Diagnosis not present

## 2022-04-19 DIAGNOSIS — J449 Chronic obstructive pulmonary disease, unspecified: Secondary | ICD-10-CM | POA: Diagnosis not present

## 2022-04-19 DIAGNOSIS — E782 Mixed hyperlipidemia: Secondary | ICD-10-CM | POA: Diagnosis not present

## 2022-04-19 DIAGNOSIS — R0609 Other forms of dyspnea: Secondary | ICD-10-CM | POA: Diagnosis not present

## 2022-04-19 DIAGNOSIS — K529 Noninfective gastroenteritis and colitis, unspecified: Secondary | ICD-10-CM | POA: Diagnosis not present

## 2022-04-19 DIAGNOSIS — Z0001 Encounter for general adult medical examination with abnormal findings: Secondary | ICD-10-CM | POA: Diagnosis not present

## 2022-04-19 DIAGNOSIS — E039 Hypothyroidism, unspecified: Secondary | ICD-10-CM | POA: Diagnosis not present

## 2022-04-19 DIAGNOSIS — R778 Other specified abnormalities of plasma proteins: Secondary | ICD-10-CM | POA: Diagnosis not present

## 2022-04-19 DIAGNOSIS — M810 Age-related osteoporosis without current pathological fracture: Secondary | ICD-10-CM | POA: Diagnosis not present

## 2022-04-19 DIAGNOSIS — M545 Low back pain, unspecified: Secondary | ICD-10-CM | POA: Diagnosis not present

## 2022-04-19 DIAGNOSIS — R600 Localized edema: Secondary | ICD-10-CM | POA: Diagnosis not present

## 2022-04-19 DIAGNOSIS — K7689 Other specified diseases of liver: Secondary | ICD-10-CM | POA: Diagnosis not present

## 2022-04-20 ENCOUNTER — Other Ambulatory Visit (HOSPITAL_COMMUNITY): Payer: Self-pay | Admitting: Family Medicine

## 2022-04-20 ENCOUNTER — Other Ambulatory Visit: Payer: Self-pay | Admitting: Family Medicine

## 2022-04-25 ENCOUNTER — Ambulatory Visit (HOSPITAL_COMMUNITY)
Admission: RE | Admit: 2022-04-25 | Discharge: 2022-04-25 | Disposition: A | Discharge status: Home / Self Care | Payer: Medicare HMO | Source: Ambulatory Visit | Attending: Internal Medicine | Admitting: Internal Medicine

## 2022-04-25 DIAGNOSIS — Z1231 Encounter for screening mammogram for malignant neoplasm of breast: Secondary | ICD-10-CM | POA: Diagnosis not present

## 2022-05-02 ENCOUNTER — Telehealth: Payer: Self-pay | Admitting: *Deleted

## 2022-05-02 ENCOUNTER — Encounter: Payer: Self-pay | Admitting: *Deleted

## 2022-05-02 DIAGNOSIS — S12301A Unspecified nondisplaced fracture of fourth cervical vertebra, initial encounter for closed fracture: Secondary | ICD-10-CM | POA: Diagnosis not present

## 2022-05-02 MED ORDER — PEG 3350-KCL-NA BICARB-NACL 420 G PO SOLR: 4000.0000 mL | Freq: Once | ORAL | 0 refills | Status: AC

## 2022-05-02 NOTE — Telephone Encounter (Signed)
Called pt. Scheduled for TCS with Dr. Gala Romney, asa 3 on 11/1 at 8:15am. Aware will mail instructions/pre-op appt. Rx for prep sent to pharmacy.

## 2022-05-03 ENCOUNTER — Other Ambulatory Visit (HOSPITAL_COMMUNITY): Payer: Self-pay | Admitting: Neurosurgery

## 2022-05-03 DIAGNOSIS — M79672 Pain in left foot: Secondary | ICD-10-CM | POA: Diagnosis not present

## 2022-05-03 DIAGNOSIS — S12301A Unspecified nondisplaced fracture of fourth cervical vertebra, initial encounter for closed fracture: Secondary | ICD-10-CM

## 2022-05-03 DIAGNOSIS — S93332D Other subluxation of left foot, subsequent encounter: Secondary | ICD-10-CM | POA: Diagnosis not present

## 2022-05-03 DIAGNOSIS — M79675 Pain in left toe(s): Secondary | ICD-10-CM | POA: Diagnosis not present

## 2022-05-03 DIAGNOSIS — M810 Age-related osteoporosis without current pathological fracture: Secondary | ICD-10-CM | POA: Diagnosis not present

## 2022-05-03 DIAGNOSIS — M779 Enthesopathy, unspecified: Secondary | ICD-10-CM | POA: Diagnosis not present

## 2022-05-06 ENCOUNTER — Ambulatory Visit (HOSPITAL_COMMUNITY)
Admission: RE | Admit: 2022-05-06 | Discharge: 2022-05-06 | Disposition: A | Discharge status: Home / Self Care | Payer: Medicare HMO | Source: Ambulatory Visit | Attending: Family Medicine | Admitting: Family Medicine

## 2022-05-06 DIAGNOSIS — I700 Atherosclerosis of aorta: Secondary | ICD-10-CM | POA: Diagnosis not present

## 2022-05-06 DIAGNOSIS — R69 Illness, unspecified: Secondary | ICD-10-CM | POA: Diagnosis not present

## 2022-05-06 DIAGNOSIS — F17200 Nicotine dependence, unspecified, uncomplicated: Secondary | ICD-10-CM | POA: Insufficient documentation

## 2022-05-06 DIAGNOSIS — J432 Centrilobular emphysema: Secondary | ICD-10-CM | POA: Insufficient documentation

## 2022-05-06 DIAGNOSIS — Z122 Encounter for screening for malignant neoplasm of respiratory organs: Secondary | ICD-10-CM | POA: Insufficient documentation

## 2022-05-06 DIAGNOSIS — F17210 Nicotine dependence, cigarettes, uncomplicated: Secondary | ICD-10-CM | POA: Diagnosis not present

## 2022-05-23 ENCOUNTER — Other Ambulatory Visit (HOSPITAL_COMMUNITY)
Admission: RE | Admit: 2022-05-23 | Discharge: 2022-05-23 | Disposition: A | Discharge status: Home / Self Care | Payer: Medicare HMO | Source: Ambulatory Visit | Attending: Gastroenterology | Admitting: Gastroenterology

## 2022-05-23 DIAGNOSIS — R1310 Dysphagia, unspecified: Secondary | ICD-10-CM | POA: Insufficient documentation

## 2022-05-23 DIAGNOSIS — K219 Gastro-esophageal reflux disease without esophagitis: Secondary | ICD-10-CM | POA: Insufficient documentation

## 2022-05-23 DIAGNOSIS — K529 Noninfective gastroenteritis and colitis, unspecified: Secondary | ICD-10-CM | POA: Insufficient documentation

## 2022-05-23 LAB — BASIC METABOLIC PANEL
Anion gap: 10 (ref 5–15)
BUN: 7 mg/dL — ABNORMAL LOW (ref 8–23)
CO2: 26 mmol/L (ref 22–32)
Calcium: 8.3 mg/dL — ABNORMAL LOW (ref 8.9–10.3)
Chloride: 105 mmol/L (ref 98–111)
Creatinine, Ser: 0.76 mg/dL (ref 0.44–1.00)
GFR, Estimated: >60 mL/min (ref >60)
Glucose, Bld: 102 mg/dL — ABNORMAL HIGH (ref 70–99)
Potassium: 3.1 mmol/L — ABNORMAL LOW (ref 3.5–5.1)
Sodium: 141 mmol/L (ref 135–145)

## 2022-05-24 ENCOUNTER — Other Ambulatory Visit: Payer: Self-pay | Admitting: Gastroenterology

## 2022-05-24 MED ORDER — POTASSIUM CHLORIDE CRYS ER 20 MEQ PO TBCR: 40.0000 meq | EXTENDED_RELEASE_TABLET | Freq: Two times a day (BID) | ORAL | 0 refills | Status: DC

## 2022-05-26 NOTE — Patient Instructions (Signed)
Jennifer [name]  05/26/2022     '@PREFPERIOPPHARMACY'$ @   Your procedure is scheduled on  06/01/2022.   Report to [facility] at  [phone]  A.M.   Call this number if you have problems the morning of surgery:  [phone]  If you experience any cold or flu symptoms such as cough, fever, chills, shortness of breath, etc. between now and your scheduled surgery, please notify us at the above number.   Remember:  Follow the diet and prep instructions given to you by the office.      Use your nebulizer and your inhalers before you come and bring your rescue inhaler with you.     Take these medicines the morning of surgery with A SIP OF WATER        hydroxyzine, levothyroxine, robaxin (if needed), zofran (if needed).     Do not wear jewelry, make-up or nail polish.  Do not wear lotions, powders, or perfumes, or deodorant.  Do not shave 48 hours prior to surgery.  Men may shave face and neck.  Do not bring valuables to the hospital.  [facility] is not responsible for any belongings or valuables.  Contacts, dentures or bridgework may not be worn into surgery.  Leave your suitcase in the car.  After surgery it may be brought to your room.  For patients admitted to the hospital, discharge time will be determined by your treatment team.  Patients discharged the day of surgery will not be allowed to drive home and must have someone with them for 24 hours.    Special instructions:   DO NOT smoke tobacco or vape for 24 hours before your procedure.  Please read over the following fact sheets that you were given. Anesthesia Post-op Instructions and Care and Recovery After Surgery      Colonoscopy, Adult, Care After The following information offers guidance on how to care for yourself after your procedure. Your health care provider may also give you more specific instructions. If you have problems or questions, contact your health care provider. What can I expect after the  procedure? After the procedure, it is common to have: A small amount of blood in your stool for 24 hours after the procedure. Some gas. Mild cramping or bloating of your abdomen. Follow these instructions at home: Eating and drinking  Drink enough fluid to keep your urine pale yellow. Follow instructions from your health care provider about eating or drinking restrictions. Resume your normal diet as told by your health care provider. Avoid heavy or fried foods that are hard to digest. Activity Rest as told by your health care provider. Avoid sitting for a long time without moving. Get up to take short walks every 1-2 hours. This is important to improve blood flow and breathing. Ask for help if you feel weak or unsteady. Return to your normal activities as told by your health care provider. Ask your health care provider what activities are safe for you. Managing cramping and bloating  Try walking around when you have cramps or feel bloated. If directed, apply heat to your abdomen as told by your health care provider. Use the heat source that your health care provider recommends, such as a moist heat pack or a heating pad. Place a towel between your skin and the heat source. Leave the heat on for 20-30 minutes. Remove the heat if your skin turns bright red. This is especially important if you are unable to  feel pain, heat, or cold. You have a greater risk of getting burned. General instructions If you were given a sedative during the procedure, it can affect you for several hours. Do not drive or operate machinery until your health care provider says that it is safe. For the first 24 hours after the procedure: Do not sign important documents. Do not drink alcohol. Do your regular daily activities at a slower pace than normal. Eat soft foods that are easy to digest. Take over-the-counter and prescription medicines only as told by your health care provider. Keep all follow-up visits. This  is important. Contact a health care provider if: You have blood in your stool 2-3 days after the procedure. Get help right away if: You have more than a small spotting of blood in your stool. You have large blood clots in your stool. You have swelling of your abdomen. You have nausea or vomiting. You have a fever. You have increasing pain in your abdomen that is not relieved with medicine. These symptoms may be an emergency. Get help right away. Call 911. Do not wait to see if the symptoms will go away. Do not drive yourself to the hospital. Summary After the procedure, it is common to have a small amount of blood in your stool. You may also have mild cramping and bloating of your abdomen. If you were given a sedative during the procedure, it can affect you for several hours. Do not drive or operate machinery until your health care provider says that it is safe. Get help right away if you have a lot of blood in your stool, nausea or vomiting, a fever, or increased pain in your abdomen. This information is not intended to replace advice given to you by your health care provider. Make sure you discuss any questions you have with your health care provider. Document Revised: 03/10/2021 Document Reviewed: 03/10/2021 Elsevier Patient Education  [place] After The following information offers guidance on how to care for yourself after your procedure. Your health care provider may also give you more specific instructions. If you have problems or questions, contact your health care provider. What can I expect after the procedure? After the procedure, it is common to have: Tiredness. Little or no memory about what happened during or after the procedure. Impaired judgment when it comes to making decisions. Nausea or vomiting. Some trouble with balance. Follow these instructions at home: For the time period you were told by your health care  provider:  Rest. Do not participate in activities where you could fall or become injured. Do not drive or use machinery. Do not drink alcohol. Do not take sleeping pills or medicines that cause drowsiness. Do not make important decisions or sign legal documents. Do not take care of children on your own. Medicines Take over-the-counter and prescription medicines only as told by your health care provider. If you were prescribed antibiotics, take them as told by your health care provider. Do not stop using the antibiotic even if you start to feel better. Eating and drinking Follow instructions from your health care provider about what you may eat and drink. Drink enough fluid to keep your urine pale yellow. If you vomit: Drink clear fluids slowly and in small amounts as you are able. Clear fluids include water, ice chips, low-calorie sports drinks, and fruit juice that has water added to it (diluted fruit juice). Eat light and bland foods in small amounts as you are able.  These foods include bananas, applesauce, rice, lean meats, toast, and crackers. General instructions  Have a responsible adult stay with you for the time you are told. It is important to have someone help care for you until you are awake and alert. If you have sleep apnea, surgery and some medicines can increase your risk for breathing problems. Follow instructions from your health care provider about wearing your sleep device: When you are sleeping. This includes during daytime naps. While taking prescription pain medicines, sleeping medicines, or medicines that make you drowsy. Do not use any products that contain nicotine or tobacco. These products include cigarettes, chewing tobacco, and vaping devices, such as e-cigarettes. If you need help quitting, ask your health care provider. Contact a health care provider if: You feel nauseous or vomit every time you eat or drink. You feel light-headed. You are still sleepy or  having trouble with balance after 24 hours. You get a rash. You have a fever. You have redness or swelling around the IV site. Get help right away if: You have trouble breathing. You have new confusion after you get home. These symptoms may be an emergency. Get help right away. Call 911. Do not wait to see if the symptoms will go away. Do not drive yourself to the hospital. This information is not intended to replace advice given to you by your health care provider. Make sure you discuss any questions you have with your health care provider. Document Revised: 12/13/2021 Document Reviewed: 12/13/2021 Elsevier Patient Education  [place].

## 2022-05-30 ENCOUNTER — Encounter (HOSPITAL_COMMUNITY)
Admission: RE | Admit: 2022-05-30 | Discharge: 2022-05-30 | Disposition: A | Discharge status: Home / Self Care | Payer: Medicare HMO | Source: Ambulatory Visit | Attending: Internal Medicine | Admitting: Internal Medicine

## 2022-05-30 ENCOUNTER — Encounter (HOSPITAL_COMMUNITY): Payer: Self-pay

## 2022-05-30 VITALS — BP 113/83 | HR 105 | Temp 97.7°F | Resp 18 | Ht 64.0 in | Wt 149.7 lb

## 2022-05-30 DIAGNOSIS — E876 Hypokalemia: Secondary | ICD-10-CM

## 2022-05-30 HISTORY — DX: Chronic obstructive pulmonary disease, unspecified: J44.9

## 2022-05-30 HISTORY — DX: Other complications of anesthesia, initial encounter: T88.59XA

## 2022-05-31 ENCOUNTER — Encounter (HOSPITAL_COMMUNITY): Payer: Self-pay | Admitting: Anesthesiology

## 2022-05-31 DIAGNOSIS — K649 Unspecified hemorrhoids: Secondary | ICD-10-CM | POA: Diagnosis not present

## 2022-05-31 DIAGNOSIS — R1030 Lower abdominal pain, unspecified: Secondary | ICD-10-CM | POA: Diagnosis not present

## 2022-05-31 DIAGNOSIS — R195 Other fecal abnormalities: Secondary | ICD-10-CM | POA: Diagnosis not present

## 2022-06-01 ENCOUNTER — Telehealth: Payer: Self-pay | Admitting: *Deleted

## 2022-06-01 ENCOUNTER — Encounter (HOSPITAL_COMMUNITY): Payer: Self-pay | Admitting: Internal Medicine

## 2022-06-01 ENCOUNTER — Ambulatory Visit (HOSPITAL_COMMUNITY)
Admission: RE | Admit: 2022-06-01 | Discharge: 2022-06-01 | Disposition: A | Discharge status: Home / Self Care | Payer: Medicare HMO | Source: Ambulatory Visit | Attending: Internal Medicine | Admitting: Internal Medicine

## 2022-06-01 ENCOUNTER — Encounter (HOSPITAL_COMMUNITY)
Admission: RE | Disposition: A | Discharge status: Home / Self Care | Payer: Self-pay | Source: Ambulatory Visit | Attending: Internal Medicine

## 2022-06-01 DIAGNOSIS — K5732 Diverticulitis of large intestine without perforation or abscess without bleeding: Secondary | ICD-10-CM

## 2022-06-01 SURGERY — COLONOSCOPY WITH PROPOFOL
Anesthesia: Monitor Anesthesia Care

## 2022-06-01 MED ORDER — LACTATED RINGERS IV SOLN: INTRAVENOUS | Status: DC

## 2022-06-01 NOTE — Progress Notes (Signed)
Patient states she attempted two cups of prep and vomited both. States stool is black water with chunks of stool. Per Dr.Rourk cancel patient today and have her call the office to reschedule. Pt verbalized understanding.

## 2022-06-01 NOTE — Telephone Encounter (Signed)
Pt called and said needed to reschedule her colonoscopy because she wasn't cleaned out good. Advised pt that provider would let us know when to reschedule and will give her a call. Verbalized understanding.

## 2022-06-02 ENCOUNTER — Other Ambulatory Visit: Payer: Self-pay | Admitting: Gastroenterology

## 2022-06-06 ENCOUNTER — Ambulatory Visit (HOSPITAL_COMMUNITY): Admission: RE | Admit: 2022-06-06 | Payer: Medicare HMO | Source: Ambulatory Visit

## 2022-06-06 ENCOUNTER — Ambulatory Visit (HOSPITAL_COMMUNITY): Payer: Medicare HMO

## 2022-06-08 ENCOUNTER — Ambulatory Visit (HOSPITAL_COMMUNITY)
Admission: RE | Admit: 2022-06-08 | Discharge: 2022-06-08 | Disposition: A | Discharge status: Home / Self Care | Payer: Medicare HMO | Source: Ambulatory Visit | Attending: Neurosurgery | Admitting: Neurosurgery

## 2022-06-08 ENCOUNTER — Other Ambulatory Visit: Payer: Self-pay | Admitting: Gastroenterology

## 2022-06-08 DIAGNOSIS — S12301A Unspecified nondisplaced fracture of fourth cervical vertebra, initial encounter for closed fracture: Secondary | ICD-10-CM | POA: Insufficient documentation

## 2022-06-08 DIAGNOSIS — M47812 Spondylosis without myelopathy or radiculopathy, cervical region: Secondary | ICD-10-CM | POA: Diagnosis not present

## 2022-06-08 DIAGNOSIS — M542 Cervicalgia: Secondary | ICD-10-CM | POA: Diagnosis not present

## 2022-06-14 DIAGNOSIS — I739 Peripheral vascular disease, unspecified: Secondary | ICD-10-CM | POA: Diagnosis not present

## 2022-06-14 DIAGNOSIS — L110 Acquired keratosis follicularis: Secondary | ICD-10-CM | POA: Diagnosis not present

## 2022-06-14 DIAGNOSIS — M79672 Pain in left foot: Secondary | ICD-10-CM | POA: Diagnosis not present

## 2022-06-14 DIAGNOSIS — M79671 Pain in right foot: Secondary | ICD-10-CM | POA: Diagnosis not present

## 2022-06-14 DIAGNOSIS — M79674 Pain in right toe(s): Secondary | ICD-10-CM | POA: Diagnosis not present

## 2022-06-14 DIAGNOSIS — M779 Enthesopathy, unspecified: Secondary | ICD-10-CM | POA: Diagnosis not present

## 2022-06-14 DIAGNOSIS — M79675 Pain in left toe(s): Secondary | ICD-10-CM | POA: Diagnosis not present

## 2022-06-29 ENCOUNTER — Ambulatory Visit (INDEPENDENT_AMBULATORY_CARE_PROVIDER_SITE_OTHER): Payer: Medicare HMO | Admitting: Gastroenterology

## 2022-06-29 ENCOUNTER — Encounter: Payer: Self-pay | Admitting: Gastroenterology

## 2022-06-29 VITALS — BP 124/79 | HR 105 | Temp 97.6°F | Ht 64.0 in | Wt 135.7 lb

## 2022-06-29 DIAGNOSIS — K529 Noninfective gastroenteritis and colitis, unspecified: Secondary | ICD-10-CM

## 2022-06-29 DIAGNOSIS — Z8719 Personal history of other diseases of the digestive system: Secondary | ICD-10-CM

## 2022-06-29 NOTE — Patient Instructions (Signed)
We are arranging a colonoscopy with Dr. Gala Romney in the near future.  Please have blood work done at [facility].   We will see you in 3 months!  Have a wonderful Christmas!  I enjoyed seeing you again today! As you know, I value our relationship and want to provide genuine, compassionate, and quality care. I welcome your feedback. If you receive a survey regarding your visit,  I greatly appreciate you taking time to fill this out. See you next time!  Annitta Needs, PhD, ANP-BC [facility]

## 2022-06-29 NOTE — Progress Notes (Signed)
Gastroenterology Office Note     Primary Care Physician:  Celene Squibb, MD  Primary Gastroenterologist: Dr. Gala Romney    Chief Complaint   Chief Complaint  Patient presents with   Melena    Having dark stools for about 3 weeks. Would like to reschedule colonoscopy.     History of Present Illness   Jennifer [name] is a 76 y.o. female presenting today in follow-up with a history of  chronic diarrhea, distant history of lymphocytic colitis, negative celiac evaluation, last colonoscopy 2020 with tubular adenomas. Fecal elastase normal. Negative small bowel biopsies. Abnormal CT 03/01/2022 with asymmetric wall thickening in sigmoid colon, minimal pericolic stranding adjacent to the sigmoid colon, query diverticulitis or neoplastic process. Liver cysts without change from prior. Treated with abx. Needs early interval colonoscopy.   States she has had black stools since prior to visit in Oct 2023.  Last EGD Oct 2022 overall normal. Had to take Mucinex and this dried her out. No BM in 3 days.  Having at least 4 stools per day. Pepto helps with gas and discomfort. If stool goes through quick, will be normal color but if retained, will be dark. Poor appetite.  Dicyclomine 10 mg QID.   Vomiting with Tri-lyte. Unable to complete prep for previously scheduled colonoscopy. Chronic diarrhea. Entocort without improvement in past. Was started on pepto off-label at last visit.   Poor appetite. Feels depressed at times. Husband passed earlier this year. Will eat food if others bring, but she does not like to prepare herself.   Weight loss: in mid 140s over past year and now 135.    Past Medical History:  Diagnosis Date   Complication of anesthesia    difficulty waking up after hysterectomy   COPD (chronic obstructive pulmonary disease) (HCC)    Depression    GERD (gastroesophageal reflux disease)    History of colon polyps    remote past, unable to retrieve records    Hyperlipidemia     Hypothyroidism    Lymphocytic colitis    Rheumatoid arthritis ([place])     Past Surgical History:  Procedure Laterality Date   COLONOSCOPY     last one was about 3 yrs in [facility]   COLONOSCOPY N/A 02/19/2016   Dr. Gala Romney: three 3-6 mm polyps (tubular adenomas) in ascending colon, segmental biopsies consistent with lymphocytic colitis. Prescribed entocort.    COLONOSCOPY N/A 03/06/2019   Dr. Gala Romney: 2 polyps removed, tubular adenomas, diverticulosis.  NO random colon biopsies performed.  Next colonoscopy 5 years.   ESOPHAGOGASTRODUODENOSCOPY N/A 08/11/2014   RMR: MIld erosive  reflux esophagitis. small hiatal hernia. Abnormal gastric muocsa uncertain significance. Subtly abnormal duodeanl (bulbar) mucosa- status post multiple biopsies. BENIGN small bowel mucosa, no evidence of villous blulnting, mild reactive gastropathy on stomach biopsy    ESOPHAGOGASTRODUODENOSCOPY (EGD) WITH PROPOFOL N/A 05/31/2021   normal   GALLBLADDER SURGERY     LEG SURGERY     right   MALONEY DILATION N/A 05/31/2021   Procedure: Venia Minks DILATION;  Surgeon: Daneil Dolin, MD;  Location: [facility];  Service: Endoscopy;  Laterality: N/A;   PARTIAL HYSTERECTOMY      Current Outpatient Medications  Medication Sig Dispense Refill   albuterol (VENTOLIN HFA) 108 (90 Base) MCG/ACT inhaler Inhale 1-2 puffs into the lungs every 6 (six) hours as needed for shortness of breath or wheezing.     BEVESPI AEROSPHERE 9-4.8 MCG/ACT AERO Inhale 1 puff into the lungs in the morning and at  bedtime.     bismuth subsalicylate (STOMACH RELIEF) 262 MG chewable tablet chew 3 TABLETS BY MOUTH THREE TIMES DAILY 270 tablet 1   Calcium Carb-Cholecalciferol (CALCIUM 600+D3 PO) Take 1 tablet by mouth in the morning.     cycloSPORINE (RESTASIS) 0.05 % ophthalmic emulsion Place 1 drop into both eyes in the morning, at noon, and at bedtime.     dicyclomine (BENTYL) 10 MG capsule Take 10 mg by mouth 4 (four) times daily -  before meals and  at bedtime.     diphenhydrAMINE (BENADRYL) 25 MG tablet Take 25 mg by mouth every 6 (six) hours as needed for allergies.     fluconazole (DIFLUCAN) 200 MG tablet Take 200 mg by mouth daily.     fluticasone (FLONASE) 50 MCG/ACT nasal spray Place 1 spray into both nostrils daily as needed for allergies.     furosemide (LASIX) 20 MG tablet Take 20 mg by mouth daily as needed (feet swelling/fluid retention.).     hydrocortisone (ANUSOL-HC) 2.5 % rectal cream Place 1 Application rectally 2 (two) times daily. To rectum for hemorrhoid discomfort or bleeding (Patient taking differently: Place 1 Application rectally 2 (two) times daily as needed for anal itching or hemorrhoids.) 30 g 1   hydrOXYzine (VISTARIL) 25 MG capsule Take 25 mg by mouth every 4 (four) hours as needed (severe itching).     ipratropium-albuterol (DUONEB) 0.5-2.5 (3) MG/3ML SOLN Inhale 3 mLs into the lungs every 6 (six) hours as needed (wheezing/shortness of breath).     ketoconazole (NIZORAL) 2 % cream Apply 1 Application topically 2 (two) times daily. To between buttocks folds for yeast. (Patient taking differently: Apply 1 Application topically 2 (two) times daily as needed (skin irritation (yeast)). To between buttocks folds for yeast.) 15 g 0   levothyroxine (SYNTHROID) 50 MCG tablet Take 50 mcg by mouth daily before breakfast.     methocarbamol (ROBAXIN) 500 MG tablet Take 500 mg by mouth daily as needed for muscle spasms.     mirabegron ER (MYRBETRIQ) 25 MG TB24 tablet Take 25 mg by mouth at bedtime.     ondansetron (ZOFRAN-ODT) 4 MG disintegrating tablet Take 4 mg by mouth 2 (two) times daily as needed for nausea.     potassium chloride SA (KLOR-CON M) 20 MEQ tablet Take 20 mEq by mouth See admin instructions. Take 1 tablet (20 meq) by mouth scheduled in the morning, may take an additional dose in the afternoon if taking 2 tablets of furosemide.     PROLIA 60 MG/ML SOSY injection Inject 60 mg into the skin every 6 (six) months.      simvastatin (ZOCOR) 40 MG tablet Take 40 mg by mouth at bedtime.     sodium chloride (OCEAN) 0.65 % SOLN nasal spray Place 1 spray into both nostrils as needed for congestion.     No current facility-administered medications for this visit.    Allergies as of 06/29/2022 - Review Complete 06/29/2022  Allergen Reaction Noted   Desyrel [trazodone] Other (See Comments) 07/14/2014   Keflex [cephalexin] Other (See Comments) 07/14/2014   Klonopin [clonazepam] Other (See Comments) 07/14/2014   Minocin [minocycline hcl] Other (See Comments) 07/14/2014   Stelazine [trifluoperazine]  07/14/2014   Viberzi [eluxadoline]  03/31/2015    Family History  Problem Relation Age of Onset   Cancer Sister        blood disorder ??   Colon cancer Neg Hx     Social History   Socioeconomic History   Marital  status: Widowed    Spouse name: Not on file   Number of children: 2   Years of education: Not on file   Highest education level: Not on file  Occupational History   Occupation: disability  Tobacco Use   Smoking status: Every Day    Packs/day: 0.25    Types: Cigarettes    Passive exposure: Current   Smokeless tobacco: Never   Tobacco comments:    quit 03/31/18  Vaping Use   Vaping Use: Never used  Substance and Sexual Activity   Alcohol use: No    Alcohol/week: 0.0 standard drinks of alcohol   Drug use: No   Sexual activity: Not Currently  Other Topics Concern   Not on file  Social History Narrative   Not on file   Social Determinants of Health   Financial Resource Strain: Not on file  Food Insecurity: Not on file  Transportation Needs: Not on file  Physical Activity: Not on file  Stress: Not on file  Social Connections: Not on file  Intimate Partner Violence: Not on file     Review of Systems   Gen: Denies any fever, chills, fatigue, weight loss, lack of appetite.  CV: Denies chest pain, heart palpitations, peripheral edema, syncope.  Resp: Denies shortness of breath at  rest or with exertion. Denies wheezing or cough.  GI:see HPI GU : Denies urinary burning, urinary frequency, urinary hesitancy MS: Denies joint pain, muscle weakness, cramps, or limitation of movement.  Derm: Denies rash, itching, dry skin Psych: Denies depression, anxiety, memory loss, and confusion Heme: Denies bruising, bleeding, and enlarged lymph nodes.   Physical Exam   BP 124/79 (BP Location: Left Arm, Patient Position: Sitting, Cuff Size: Normal)   Pulse (!) 105   Temp 97.6 F (36.4 C) (Oral)   Ht '5\' 4"'$  (1.626 m)   Wt 135 lb 11.2 oz (61.6 kg)   BMI 23.29 kg/m  General:   Alert and oriented. Pleasant and cooperative. Well-nourished and well-developed.  Head:  Normocephalic and atraumatic. Eyes:  Without icterus Abdomen:  +BS, soft, non-tender and non-distended. No HSM noted. No guarding or rebound. No masses appreciated.  Rectal:  Deferred  Msk:  Symmetrical without gross deformities. Normal posture. Extremities:  Without edema. Neurologic:  Alert and  oriented x4;  grossly normal neurologically. Skin:  Intact without significant lesions or rashes. Psych:  Alert and cooperative. Normal mood and affect.     Assessment   Jennifer [name] is a 76 y.o. female presenting today in follow-up with a history of  chronic diarrhea, distant history of lymphocytic colitis, negative celiac evaluation, last colonoscopy 2020 with tubular adenomas. Fecal elastase normal. Negative small bowel biopsies. Abnormal CT 03/01/2022 with asymmetric wall thickening in sigmoid colon, minimal pericolic stranding adjacent to the sigmoid colon, query diverticulitis or neoplastic process. Liver cysts without change from prior. Treated with abx. Needs early interval colonoscopy.   History of diverticulitis: in Aug 2023. Improved from this. Unable to tolerate trilyte prep recently with vomiting. Will try Miralax prep instead. Needs diagnostic colonoscopy due to prior diverticulitis episode. Last colonoscopy 2020  with adenomas.  Chronic diarrhea: as noted above. Off Entocort now as no significant improvement with this. On dicyclomine and pepto.  Reporting black stools: since prior to Oct 2023. However, stool is brown if she has diarrhea and black if constipated. Doubt this is melena. EGD on file from last year. Check CBC today. Also on Pepto, which contributes.   Weight loss: 10 lbs lost  in about a month. Poor appetite. Abdominal cramping noted but improved with pepto. No food aversions. Recommend CTA if progressive weight loss, postprandial abdominal pain, food aversion, etc. Will follow closely. I suspect depression playing a role as well as noted in HPI.   PLAN    Check CBC today Proceed with colonoscopy by Dr. Gala Romney in near future: the risks, benefits, and alternatives have been discussed with the patient in detail. The patient states understanding and desires to proceed. ASA 3 3 month close follow-up   Annitta Needs, PhD, ANP-BC [facility]

## 2022-06-30 ENCOUNTER — Telehealth (INDEPENDENT_AMBULATORY_CARE_PROVIDER_SITE_OTHER): Payer: Self-pay | Admitting: *Deleted

## 2022-06-30 LAB — CBC WITH DIFFERENTIAL/PLATELET
Basophils Absolute: 0.1 10*3/uL (ref 0.0–0.2)
Basos: 0 %
EOS (ABSOLUTE): 0.2 10*3/uL (ref 0.0–0.4)
Eos: 1 %
Hematocrit: 49.3 % — ABNORMAL HIGH (ref 34.0–46.6)
Hemoglobin: 16 g/dL — ABNORMAL HIGH (ref 11.1–15.9)
Immature Grans (Abs): 0.1 10*3/uL (ref 0.0–0.1)
Immature Granulocytes: 1 %
Lymphocytes Absolute: 4.3 10*3/uL — ABNORMAL HIGH (ref 0.7–3.1)
Lymphs: 32 %
MCH: 28.5 pg (ref 26.6–33.0)
MCHC: 32.5 g/dL (ref 31.5–35.7)
MCV: 88 fL (ref 79–97)
Monocytes Absolute: 1.2 10*3/uL — ABNORMAL HIGH (ref 0.1–0.9)
Monocytes: 9 %
Neutrophils Absolute: 7.7 10*3/uL — ABNORMAL HIGH (ref 1.4–7.0)
Neutrophils: 57 %
Platelets: 383 10*3/uL (ref 150–450)
RBC: 5.61 x10E6/uL — ABNORMAL HIGH (ref 3.77–5.28)
RDW: 11.9 % (ref 11.7–15.4)
WBC: 13.5 10*3/uL — ABNORMAL HIGH (ref 3.4–10.8)

## 2022-06-30 NOTE — Telephone Encounter (Signed)
LMOVM to call back to schedule TCS with Dr. Gala Romney, ASA 3, requested miralax prep

## 2022-07-04 NOTE — Telephone Encounter (Signed)
Letter mailed

## 2022-07-07 NOTE — Telephone Encounter (Signed)
Pt called in. Scheduled for 1/3 at 10:30am. Aware will send instructions via mychart. Pre-op appt also. Stated insurance will be same next year

## 2022-07-08 ENCOUNTER — Encounter: Payer: Self-pay | Admitting: *Deleted

## 2022-07-18 ENCOUNTER — Other Ambulatory Visit: Payer: Self-pay

## 2022-07-18 DIAGNOSIS — D72828 Other elevated white blood cell count: Secondary | ICD-10-CM

## 2022-07-21 DIAGNOSIS — Z515 Encounter for palliative care: Secondary | ICD-10-CM | POA: Diagnosis not present

## 2022-07-21 DIAGNOSIS — J449 Chronic obstructive pulmonary disease, unspecified: Secondary | ICD-10-CM | POA: Diagnosis not present

## 2022-07-28 NOTE — Patient Instructions (Addendum)
Jennifer [name]  07/28/2022     '@PREFPERIOPPHARMACY'$ @   Your procedure is scheduled on  08/03/2022.   Report to [facility] at  1000 A.M.   Call this number if you have problems the morning of surgery:  [phone]  If you experience any cold or flu symptoms such as cough, fever, chills, shortness of breath, etc. between now and your scheduled surgery, please notify us at the above number.   Remember:  Follow the diet and prep instructions given to you by the office.    Take these medicines the morning of surgery with A SIP OF WATER        hydroxyzine (if needed), levothyroxine, robaxin (if needed), zofran (if needed).     Do not wear jewelry, make-up or nail polish.  Do not wear lotions, powders, or perfumes, or deodorant.  Do not shave 48 hours prior to surgery.  Men may shave face and neck.  Do not bring valuables to the hospital.  [facility] is not responsible for any belongings or valuables.  Contacts, dentures or bridgework may not be worn into surgery.  Leave your suitcase in the car.  After surgery it may be brought to your room.  For patients admitted to the hospital, discharge time will be determined by your treatment team.  Patients discharged the day of surgery will not be allowed to drive home and must have someone with them for 24 hours.    Special instructions:   DO NOT smoke tobacco or vape for 24 hours before your procedure.  Please read over the following fact sheets that you were given. Anesthesia Post-op Instructions and Care and Recovery After Surgery       Colonoscopy, Adult, Care After The following information offers guidance on how to care for yourself after your procedure. Your health care provider may also give you more specific instructions. If you have problems or questions, contact your health care provider. What can I expect after the procedure? After the procedure, it is common to have: A small amount of blood in your stool for 24  hours after the procedure. Some gas. Mild cramping or bloating of your abdomen. Follow these instructions at home: Eating and drinking  Drink enough fluid to keep your urine pale yellow. Follow instructions from your health care provider about eating or drinking restrictions. Resume your normal diet as told by your health care provider. Avoid heavy or fried foods that are hard to digest. Activity Rest as told by your health care provider. Avoid sitting for a long time without moving. Get up to take short walks every 1-2 hours. This is important to improve blood flow and breathing. Ask for help if you feel weak or unsteady. Return to your normal activities as told by your health care provider. Ask your health care provider what activities are safe for you. Managing cramping and bloating  Try walking around when you have cramps or feel bloated. If directed, apply heat to your abdomen as told by your health care provider. Use the heat source that your health care provider recommends, such as a moist heat pack or a heating pad. Place a towel between your skin and the heat source. Leave the heat on for 20-30 minutes. Remove the heat if your skin turns bright red. This is especially important if you are unable to feel pain, heat, or cold. You have a greater risk of getting burned. General instructions If you were given a  sedative during the procedure, it can affect you for several hours. Do not drive or operate machinery until your health care provider says that it is safe. For the first 24 hours after the procedure: Do not sign important documents. Do not drink alcohol. Do your regular daily activities at a slower pace than normal. Eat soft foods that are easy to digest. Take over-the-counter and prescription medicines only as told by your health care provider. Keep all follow-up visits. This is important. Contact a health care provider if: You have blood in your stool 2-3 days after the  procedure. Get help right away if: You have more than a small spotting of blood in your stool. You have large blood clots in your stool. You have swelling of your abdomen. You have nausea or vomiting. You have a fever. You have increasing pain in your abdomen that is not relieved with medicine. These symptoms may be an emergency. Get help right away. Call 911. Do not wait to see if the symptoms will go away. Do not drive yourself to the hospital. Summary After the procedure, it is common to have a small amount of blood in your stool. You may also have mild cramping and bloating of your abdomen. If you were given a sedative during the procedure, it can affect you for several hours. Do not drive or operate machinery until your health care provider says that it is safe. Get help right away if you have a lot of blood in your stool, nausea or vomiting, a fever, or increased pain in your abdomen. This information is not intended to replace advice given to you by your health care provider. Make sure you discuss any questions you have with your health care provider. Document Revised: 03/10/2021 Document Reviewed: 03/10/2021 Elsevier Patient Education  [place] After The following information offers guidance on how to care for yourself after your procedure. Your health care provider may also give you more specific instructions. If you have problems or questions, contact your health care provider. What can I expect after the procedure? After the procedure, it is common to have: Tiredness. Little or no memory about what happened during or after the procedure. Impaired judgment when it comes to making decisions. Nausea or vomiting. Some trouble with balance. Follow these instructions at home: For the time period you were told by your health care provider:  Rest. Do not participate in activities where you could fall or become injured. Do not drive or use  machinery. Do not drink alcohol. Do not take sleeping pills or medicines that cause drowsiness. Do not make important decisions or sign legal documents. Do not take care of children on your own. Medicines Take over-the-counter and prescription medicines only as told by your health care provider. If you were prescribed antibiotics, take them as told by your health care provider. Do not stop using the antibiotic even if you start to feel better. Eating and drinking Follow instructions from your health care provider about what you may eat and drink. Drink enough fluid to keep your urine pale yellow. If you vomit: Drink clear fluids slowly and in small amounts as you are able. Clear fluids include water, ice chips, low-calorie sports drinks, and fruit juice that has water added to it (diluted fruit juice). Eat light and bland foods in small amounts as you are able. These foods include bananas, applesauce, rice, lean meats, toast, and crackers. General instructions  Have a responsible adult stay with  you for the time you are told. It is important to have someone help care for you until you are awake and alert. If you have sleep apnea, surgery and some medicines can increase your risk for breathing problems. Follow instructions from your health care provider about wearing your sleep device: When you are sleeping. This includes during daytime naps. While taking prescription pain medicines, sleeping medicines, or medicines that make you drowsy. Do not use any products that contain nicotine or tobacco. These products include cigarettes, chewing tobacco, and vaping devices, such as e-cigarettes. If you need help quitting, ask your health care provider. Contact a health care provider if: You feel nauseous or vomit every time you eat or drink. You feel light-headed. You are still sleepy or having trouble with balance after 24 hours. You get a rash. You have a fever. You have redness or swelling around  the IV site. Get help right away if: You have trouble breathing. You have new confusion after you get home. These symptoms may be an emergency. Get help right away. Call 911. Do not wait to see if the symptoms will go away. Do not drive yourself to the hospital. This information is not intended to replace advice given to you by your health care provider. Make sure you discuss any questions you have with your health care provider. Document Revised: 12/13/2021 Document Reviewed: 12/13/2021 Elsevier Patient Education  [place].

## 2022-07-29 ENCOUNTER — Encounter (HOSPITAL_COMMUNITY): Payer: Self-pay

## 2022-07-29 ENCOUNTER — Encounter (HOSPITAL_COMMUNITY)
Admission: RE | Admit: 2022-07-29 | Discharge: 2022-07-29 | Disposition: A | Discharge status: Home / Self Care | Payer: Medicare HMO | Source: Ambulatory Visit | Attending: Internal Medicine | Admitting: Internal Medicine

## 2022-07-29 VITALS — Ht 64.0 in | Wt 135.0 lb

## 2022-07-29 DIAGNOSIS — E876 Hypokalemia: Secondary | ICD-10-CM | POA: Diagnosis not present

## 2022-07-29 DIAGNOSIS — R69 Illness, unspecified: Secondary | ICD-10-CM | POA: Diagnosis not present

## 2022-07-29 DIAGNOSIS — Z01818 Encounter for other preprocedural examination: Secondary | ICD-10-CM | POA: Insufficient documentation

## 2022-07-29 DIAGNOSIS — F17200 Nicotine dependence, unspecified, uncomplicated: Secondary | ICD-10-CM

## 2022-07-29 LAB — BASIC METABOLIC PANEL
Anion gap: 8 (ref 5–15)
BUN: 7 mg/dL — ABNORMAL LOW (ref 8–23)
CO2: 22 mmol/L (ref 22–32)
Calcium: 8.4 mg/dL — ABNORMAL LOW (ref 8.9–10.3)
Chloride: 107 mmol/L (ref 98–111)
Creatinine, Ser: 0.71 mg/dL (ref 0.44–1.00)
GFR, Estimated: >60 mL/min (ref >60)
Glucose, Bld: 142 mg/dL — ABNORMAL HIGH (ref 70–99)
Potassium: 3.4 mmol/L — ABNORMAL LOW (ref 3.5–5.1)
Sodium: 137 mmol/L (ref 135–145)

## 2022-07-29 NOTE — Progress Notes (Signed)
Patient presents to PAT appointment with Sinus Tachycardia 120/140 Heart rate.  Reviewed with anesthesia and patient will need to follow up with Primary Provider/Cardiology for clearance before we can proceed.

## 2022-07-30 NOTE — Progress Notes (Signed)
Reviewed by R. Michael Ilah Boule, MD FACP FACG 

## 2022-08-03 ENCOUNTER — Ambulatory Visit (HOSPITAL_COMMUNITY): Admission: RE | Admit: 2022-08-03 | Payer: Medicare HMO | Source: Ambulatory Visit | Admitting: Internal Medicine

## 2022-08-03 ENCOUNTER — Encounter (HOSPITAL_COMMUNITY): Admission: RE | Payer: Self-pay | Source: Ambulatory Visit

## 2022-08-03 SURGERY — COLONOSCOPY WITH PROPOFOL
Anesthesia: Monitor Anesthesia Care

## 2022-08-09 ENCOUNTER — Ambulatory Visit: Payer: Medicare HMO | Attending: Cardiology | Admitting: Cardiology

## 2022-08-09 ENCOUNTER — Encounter: Payer: Self-pay | Admitting: Cardiology

## 2022-08-09 VITALS — BP 118/64 | HR 102 | Ht 64.0 in | Wt 127.8 lb

## 2022-08-09 DIAGNOSIS — R002 Palpitations: Secondary | ICD-10-CM

## 2022-08-09 DIAGNOSIS — R000 Tachycardia, unspecified: Secondary | ICD-10-CM

## 2022-08-09 NOTE — Progress Notes (Signed)
Clinical Summary Jennifer [name] is a 77 y.o.female seen today for follow up of the following medical problems.   1. LE edema - comes and goes, ongoing x 10 years.  - seems to be worst  09/2016 echo LVEF 60-65%, no WMAs, grade I DDx - pcp had started torsemide, seems to be controlled with torsemide.    11/2019 labs Na 147, K 2.9, Cr 0.84, TSH 0.5, BNP 77 - has compression stockings.   11/2020 echo: LVEF 60-65%, no WMAs, grade I dd, normal RV function   -from ntoes NP Huffman changed patient to lasix 03/2022. Torsemide causes leg cramps - no recent issues swith edema       2. Sinus tachycardia - noted during preop for colonscopy - at time of EKG symptoms of fatigue, some SOB, some cough though improved with weaning smoking - decreased oral intake. 22 lbs weight loss since 12/2020, down 8 lbs since 06/2022 -has had some orthostatic symptoms. Orthostatic bp's are abnormal likely due to hypovolemia. - has not used diuretic.  - 06/29/22 WBC 13.5 Hgb 16  Past Medical History:  Diagnosis Date   Complication of anesthesia    difficulty waking up after hysterectomy   COPD (chronic obstructive pulmonary disease) (HCC)    Depression    GERD (gastroesophageal reflux disease)    History of colon polyps    remote past, unable to retrieve records    Hyperlipidemia    Hypothyroidism    Lymphocytic colitis    Rheumatoid arthritis ([place])      Allergies  Allergen Reactions   Desyrel [Trazodone] Other (See Comments)    Body shaking    Keflex [Cephalexin] Other (See Comments)    (Unknown)   Klonopin [Clonazepam] Other (See Comments)    (Unknown)   Minocin [Minocycline Hcl] Other (See Comments)    (Unknown)   Stelazine [Trifluoperazine]     Mouth was crossing over    Viberzi [Eluxadoline]     abd pain, dries out eyes,      Current Outpatient Medications  Medication Sig Dispense Refill   albuterol (VENTOLIN HFA) 108 (90 Base) MCG/ACT inhaler Inhale 1-2 puffs into the lungs every  6 (six) hours as needed for shortness of breath or wheezing.     BEVESPI AEROSPHERE 9-4.8 MCG/ACT AERO Inhale 2 puffs into the lungs 2 (two) times daily as needed (shortness of breath).     bismuth subsalicylate (STOMACH RELIEF) 262 MG chewable tablet chew 3 TABLETS BY MOUTH THREE TIMES DAILY 270 tablet 1   Calcium Carb-Cholecalciferol (CALCIUM 600+D3 PO) Take 1 tablet by mouth in the morning.     cycloSPORINE (RESTASIS) 0.05 % ophthalmic emulsion Place 1 drop into both eyes in the morning, at noon, and at bedtime.     dicyclomine (BENTYL) 10 MG capsule Take 10 mg by mouth 4 (four) times daily -  before meals and at bedtime.     fluticasone (FLONASE) 50 MCG/ACT nasal spray Place 1 spray into both nostrils daily as needed for allergies.     furosemide (LASIX) 20 MG tablet Take 20 mg by mouth daily as needed (feet swelling/fluid retention.).     hydrocortisone (ANUSOL-HC) 2.5 % rectal cream Place 1 Application rectally 2 (two) times daily. To rectum for hemorrhoid discomfort or bleeding (Patient taking differently: Place 1 Application rectally 2 (two) times daily as needed for anal itching or hemorrhoids.) 30 g 1   hydrOXYzine (VISTARIL) 25 MG capsule Take 25 mg by mouth daily as needed for  itching.     ipratropium-albuterol (DUONEB) 0.5-2.5 (3) MG/3ML SOLN Inhale 3 mLs into the lungs every 6 (six) hours as needed (wheezing/shortness of breath).     ketoconazole (NIZORAL) 2 % cream Apply 1 Application topically 2 (two) times daily. To between buttocks folds for yeast. (Patient taking differently: Apply 1 Application topically 2 (two) times daily as needed (skin irritation (yeast)). To between buttocks folds for yeast.) 15 g 0   levothyroxine (SYNTHROID) 50 MCG tablet Take 50 mcg by mouth daily before breakfast.     methocarbamol (ROBAXIN) 500 MG tablet Take 500 mg by mouth daily as needed for muscle spasms.     mirabegron ER (MYRBETRIQ) 25 MG TB24 tablet Take 25 mg by mouth at bedtime.     ondansetron  (ZOFRAN-ODT) 4 MG disintegrating tablet Take 4 mg by mouth 2 (two) times daily as needed for nausea.     potassium chloride SA (KLOR-CON M) 20 MEQ tablet Take 20 mEq by mouth See admin instructions. Take 1 tablet (20 meq) by mouth scheduled in the morning, may take an additional dose in the afternoon if taking 2 tablets of furosemide.     PROLIA 60 MG/ML SOSY injection Inject 60 mg into the skin every 6 (six) months.     simvastatin (ZOCOR) 40 MG tablet Take 40 mg by mouth at bedtime.     sodium chloride (OCEAN) 0.65 % SOLN nasal spray Place 1 spray into both nostrils as needed for congestion.     No current facility-administered medications for this visit.     Past Surgical History:  Procedure Laterality Date   cataract  surgery Bilateral    COLONOSCOPY     last one was about 3 yrs in [facility]   COLONOSCOPY N/A 02/19/2016   Dr. Gala Romney: three 3-6 mm polyps (tubular adenomas) in ascending colon, segmental biopsies consistent with lymphocytic colitis. Prescribed entocort.    COLONOSCOPY N/A 03/06/2019   Dr. Gala Romney: 2 polyps removed, tubular adenomas, diverticulosis.  NO random colon biopsies performed.  Next colonoscopy 5 years.   ESOPHAGOGASTRODUODENOSCOPY N/A 08/11/2014   RMR: MIld erosive  reflux esophagitis. small hiatal hernia. Abnormal gastric muocsa uncertain significance. Subtly abnormal duodeanl (bulbar) mucosa- status post multiple biopsies. BENIGN small bowel mucosa, no evidence of villous blulnting, mild reactive gastropathy on stomach biopsy    ESOPHAGOGASTRODUODENOSCOPY (EGD) WITH PROPOFOL N/A 05/31/2021   normal   GALLBLADDER SURGERY     LEG SURGERY     right   MALONEY DILATION N/A 05/31/2021   Procedure: Venia Minks DILATION;  Surgeon: Daneil Dolin, MD;  Location: [facility];  Service: Endoscopy;  Laterality: N/A;   PARTIAL HYSTERECTOMY       Allergies  Allergen Reactions   Desyrel [Trazodone] Other (See Comments)    Body shaking    Keflex [Cephalexin] Other  (See Comments)    (Unknown)   Klonopin [Clonazepam] Other (See Comments)    (Unknown)   Minocin [Minocycline Hcl] Other (See Comments)    (Unknown)   Stelazine [Trifluoperazine]     Mouth was crossing over    Viberzi [Eluxadoline]     abd pain, dries out eyes,       Family History  Problem Relation Age of Onset   Cancer Sister        blood disorder ??   Colon cancer Neg Hx      Social History Jennifer [name] reports that she has been smoking cigarettes. She has been smoking an average of .25 packs per day. She  has been exposed to tobacco smoke. She has never used smokeless tobacco. Jennifer [name] reports no history of alcohol use.   Review of Systems CONSTITUTIONAL: No weight loss, fever, chills, weakness or fatigue.  HEENT: Eyes: No visual loss, blurred vision, double vision or yellow sclerae.No hearing loss, sneezing, congestion, runny nose or sore throat.  SKIN: No rash or itching.  CARDIOVASCULAR: per hpi RESPIRATORY: No shortness of breath, cough or sputum.  GASTROINTESTINAL: No anorexia, nausea, vomiting or diarrhea. No abdominal pain or blood.  GENITOURINARY: No burning on urination, no polyuria NEUROLOGICAL: No headache, dizziness, syncope, paralysis, ataxia, numbness or tingling in the extremities. No change in bowel or bladder control.  MUSCULOSKELETAL: No muscle, back pain, joint pain or stiffness.  LYMPHATICS: No enlarged nodes. No history of splenectomy.  PSYCHIATRIC: No history of depression or anxiety.  ENDOCRINOLOGIC: No reports of sweating, cold or heat intolerance. No polyuria or polydipsia.  Marland Kitchen   Physical Examination Today's Vitals   08/09/22 0805  BP: 118/64  Pulse: (!) 102  SpO2: 94%  Weight: 127 lb 12.8 oz (58 kg)  Height: '5\' 4"'$  (1.626 m)   Body mass index is 21.94 kg/m.  Gen: resting comfortably, no acute distress HEENT: no scleral icterus, pupils equal round and reactive, no palptable cervical adenopathy,  CV: RRR, no m/r/g no jvd Resp: Clear to  auscultation bilaterally GI: abdomen is soft, non-tender, non-distended, normal bowel sounds, no hepatosplenomegaly MSK: extremities are warm, no edema.  Skin: warm, no rash Neuro:  no focal deficits Psych: appropriate affect   Diagnostic Studies 11/2020 echo 1. Left ventricular ejection fraction, by estimation, is 60 to 65%. The  left ventricle has normal function. The left ventricle has no regional  wall motion abnormalities. Left ventricular diastolic parameters are  consistent with Grade I diastolic  dysfunction (impaired relaxation).   2. Right ventricular systolic function is normal. The right ventricular  size is normal.   3. The mitral valve is normal in structure. No evidence of mitral valve  regurgitation. No evidence of mitral stenosis.   4. The aortic valve has an indeterminant number of cusps. Aortic valve  regurgitation is trivial.   5. The inferior vena cava is normal in size with greater than 50%  respiratory variability, suggesting right atrial pressure of 3 mmHg.     Assessment and Plan   1.Sinus tachycardia - not a primary cardiac issue, in response to whatever has led to her fatigue, poor oral intake, weight loss, leukocytosis. Orthostatic today by SBP likely due to poor oral intake, encouraged increasing fluid intake 2-3L water per day.  - can recheck bmet/cbc/tsh - no other cardiac testing is indicated, ongoing evaluation for causes of her other systemic symptoms per primary team and GI. Encouraged increased oral intake - nothing cardiac at this time would prohibit her endoscopy.  2. LE edema - continue prn lasix F/u 1 year     Arnoldo Lenis, M.D.

## 2022-08-09 NOTE — Patient Instructions (Signed)
Medication Instructions:  Continue all current medications.  Labwork: none  Testing/Procedures: none  Follow-Up: Your physician wants you to follow up in:  1 year.  You should receive a call from the office when due.  If you don't receive this call, please call our office to schedule the follow up appointment    Any Other Special Instructions Will Be Listed Below (If Applicable). Increase water intake to 2-3 liters per day.   If you need a refill on your cardiac medications before your next appointment, please call your pharmacy.

## 2022-08-15 ENCOUNTER — Other Ambulatory Visit: Payer: Self-pay

## 2022-08-15 DIAGNOSIS — D72828 Other elevated white blood cell count: Secondary | ICD-10-CM

## 2022-08-30 DIAGNOSIS — J449 Chronic obstructive pulmonary disease, unspecified: Secondary | ICD-10-CM | POA: Diagnosis not present

## 2022-08-30 DIAGNOSIS — Z515 Encounter for palliative care: Secondary | ICD-10-CM | POA: Diagnosis not present

## 2022-09-28 NOTE — Progress Notes (Deleted)
GI Office Note    Referring Provider: Celene Squibb, MD Primary Care Physician:  Celene Squibb, MD Primary Gastroenterologist: Cristopher Estimable.Rourk, MD  Date:  09/28/2022  ID:  Jennifer [name], Jennifer [name] [birth year], MRN [id number]   Chief Complaint   No chief complaint on file.   History of Present Illness  Jennifer [name] is a 77 y.o. female with a history of chronic diarrhea, distant history of lymphocytic colitis, COPD, depression, GERD, HLD, hypothyroidism, RA presenting today for follow-up.  Evaluation of diarrhea includes negative celiac serologies, normal fecal elastase, negative small bowel biopsies.  EGD October 2022: -Normal esophagus s/p dilation -Normal stomach and duodenum  Abnormal CT in August 2023 with asymmetric wall thickening of the sigmoid colon, minimal pericolic stranding adjacent to the sigmoid colon, query diverticulitis or neoplastic process.  Liver cysts without change from prior.  Was treated with antibiotics for possible diverticulitis.  Last office visit 06/29/2022.  Noted to need early interval colonoscopy due to abnormal CT findings in August 2023.  Stated she had been having melena for about 1 month.  Reported issues with constipation with taking Mucinex.  Taking Pepto to help with gas and discomfort.  Poor appetite.  Taking dicyclomine 10 mg 4 times daily.  Noted to have vomiting with trialing the past and was unable to complete prep for previously scheduled colonoscopy.  Had no improvement in diarrhea with Entocort in the past.  Noted to be feeling depressed after loss of her husband earlier in 2023.  Appetite CBC is scheduled for colonoscopy.  Advised CTA if progressive weight loss, postprandial abdominal pain, submersion.  Will also suspected to be secondary to depression.  Labs 06/29/2022: Hemoglobin 16, platelets 353, WBC 13.5  Labs 07/29/2022: Potassium 3.4  Today: Diarrhea -   Weight loss -   Wt Readings from Last 3 Encounters:  08/09/22 127 lb 12.8 oz (58  kg)  07/29/22 135 lb (61.2 kg)  06/29/22 135 lb 11.2 oz (61.6 kg)  06/29/22 135lbs  Current Outpatient Medications  Medication Sig Dispense Refill   albuterol (VENTOLIN HFA) 108 (90 Base) MCG/ACT inhaler Inhale 1-2 puffs into the lungs every 6 (six) hours as needed for shortness of breath or wheezing.     BEVESPI AEROSPHERE 9-4.8 MCG/ACT AERO Inhale 2 puffs into the lungs 2 (two) times daily as needed (shortness of breath).     bismuth subsalicylate (STOMACH RELIEF) 262 MG chewable tablet chew 3 TABLETS BY MOUTH THREE TIMES DAILY 270 tablet 1   Calcium Carb-Cholecalciferol (CALCIUM 600+D3 PO) Take 1 tablet by mouth in the morning.     cycloSPORINE (RESTASIS) 0.05 % ophthalmic emulsion Place 1 drop into both eyes in the morning, at noon, and at bedtime.     dicyclomine (BENTYL) 10 MG capsule Take 10 mg by mouth 4 (four) times daily -  before meals and at bedtime.     fluticasone (FLONASE) 50 MCG/ACT nasal spray Place 1 spray into both nostrils daily as needed for allergies.     furosemide (LASIX) 20 MG tablet Take 20 mg by mouth daily as needed (feet swelling/fluid retention.).     hydrocortisone (ANUSOL-HC) 2.5 % rectal cream Place 1 Application rectally 2 (two) times daily. To rectum for hemorrhoid discomfort or bleeding (Patient taking differently: Place 1 Application rectally 2 (two) times daily as needed for anal itching or hemorrhoids.) 30 g 1   hydrOXYzine (VISTARIL) 25 MG capsule Take 25 mg by mouth daily as needed for itching.     ipratropium-albuterol (  DUONEB) 0.5-2.5 (3) MG/3ML SOLN Inhale 3 mLs into the lungs every 6 (six) hours as needed (wheezing/shortness of breath).     ketoconazole (NIZORAL) 2 % cream Apply 1 Application topically 2 (two) times daily. To between buttocks folds for yeast. (Patient taking differently: Apply 1 Application topically 2 (two) times daily as needed (skin irritation (yeast)). To between buttocks folds for yeast.) 15 g 0   levothyroxine (SYNTHROID) 50 MCG  tablet Take 50 mcg by mouth daily before breakfast.     methocarbamol (ROBAXIN) 500 MG tablet Take 500 mg by mouth daily as needed for muscle spasms.     mirabegron ER (MYRBETRIQ) 25 MG TB24 tablet Take 25 mg by mouth at bedtime.     ondansetron (ZOFRAN-ODT) 4 MG disintegrating tablet Take 4 mg by mouth 2 (two) times daily as needed for nausea.     potassium chloride SA (KLOR-CON M) 20 MEQ tablet Take 20 mEq by mouth See admin instructions. Take 1 tablet (20 meq) by mouth scheduled in the morning, may take an additional dose in the afternoon if taking 2 tablets of furosemide.     PROLIA 60 MG/ML SOSY injection Inject 60 mg into the skin every 6 (six) months.     simvastatin (ZOCOR) 40 MG tablet Take 40 mg by mouth at bedtime.     sodium chloride (OCEAN) 0.65 % SOLN nasal spray Place 1 spray into both nostrils as needed for congestion.     No current facility-administered medications for this visit.    Past Medical History:  Diagnosis Date   Complication of anesthesia    difficulty waking up after hysterectomy   COPD (chronic obstructive pulmonary disease) (HCC)    Depression    GERD (gastroesophageal reflux disease)    History of colon polyps    remote past, unable to retrieve records    Hyperlipidemia    Hypothyroidism    Lymphocytic colitis    Rheumatoid arthritis ([place])     Past Surgical History:  Procedure Laterality Date   cataract  surgery Bilateral    COLONOSCOPY     last one was about 3 yrs in [facility]   COLONOSCOPY N/A 02/19/2016   Dr. Gala Romney: three 3-6 mm polyps (tubular adenomas) in ascending colon, segmental biopsies consistent with lymphocytic colitis. Prescribed entocort.    COLONOSCOPY N/A 03/06/2019   Dr. Gala Romney: 2 polyps removed, tubular adenomas, diverticulosis.  NO random colon biopsies performed.  Next colonoscopy 5 years.   ESOPHAGOGASTRODUODENOSCOPY N/A 08/11/2014   RMR: MIld erosive  reflux esophagitis. small hiatal hernia. Abnormal gastric muocsa  uncertain significance. Subtly abnormal duodeanl (bulbar) mucosa- status post multiple biopsies. BENIGN small bowel mucosa, no evidence of villous blulnting, mild reactive gastropathy on stomach biopsy    ESOPHAGOGASTRODUODENOSCOPY (EGD) WITH PROPOFOL N/A 05/31/2021   normal   GALLBLADDER SURGERY     LEG SURGERY     right   MALONEY DILATION N/A 05/31/2021   Procedure: Venia Minks DILATION;  Surgeon: Daneil Dolin, MD;  [facility];  Service: Endoscopy;  Laterality: N/A;   PARTIAL HYSTERECTOMY      Family History  Problem Relation Age of Onset   Cancer Sister        blood disorder ??   Colon cancer Neg Hx     Allergies as of 09/29/2022 - Review Complete 08/09/2022  Allergen Reaction Noted   Desyrel [trazodone] Other (See Comments) 07/14/2014   Keflex [cephalexin] Other (See Comments) 07/14/2014   Klonopin [clonazepam] Other (See Comments) 07/14/2014  Minocin [minocycline hcl] Other (See Comments) 07/14/2014   Stelazine [trifluoperazine]  07/14/2014   Viberzi [eluxadoline]  03/31/2015    Social History   Socioeconomic History   Marital status: Widowed    Spouse name: Not on file   Number of children: 2   Years of education: Not on file   Highest education level: Not on file  Occupational History   Occupation: disability  Tobacco Use   Smoking status: Every Day    Packs/day: 0.25    Types: Cigarettes    Passive exposure: Current   Smokeless tobacco: Never   Tobacco comments:    quit 03/31/18  Vaping Use   Vaping Use: Never used  Substance and Sexual Activity   Alcohol use: No    Alcohol/week: 0.0 standard drinks of alcohol   Drug use: No   Sexual activity: Not Currently  Other Topics Concern   Not on file  Social History Narrative   Not on file   Social Determinants of Health   Financial Resource Strain: Not on file  Food Insecurity: Not on file  Transportation Needs: Not on file  Physical Activity: Not on file  Stress: Not on file  Social  Connections: Not on file     Review of Systems   Gen: Denies fever, chills, anorexia. Denies fatigue, weakness, weight loss.  CV: Denies chest pain, palpitations, syncope, peripheral edema, and claudication. Resp: Denies dyspnea at rest, cough, wheezing, coughing up blood, and pleurisy. GI: See HPI Derm: Denies rash, itching, dry skin Psych: Denies depression, anxiety, memory loss, confusion. No homicidal or suicidal ideation.  Heme: Denies bruising, bleeding, and enlarged lymph nodes.   Physical Exam   There were no vitals taken for this visit.  General:   Alert and oriented. No distress noted. Pleasant and cooperative.  Head:  Normocephalic and atraumatic. Eyes:  Conjuctiva clear without scleral icterus. Mouth:  Oral mucosa pink and moist. Good dentition. No lesions. Lungs:  Clear to auscultation bilaterally. No wheezes, rales, or rhonchi. No distress.  Heart:  S1, S2 present without murmurs appreciated.  Abdomen:  +BS, soft, non-tender and non-distended. No rebound or guarding. No HSM or masses noted. Rectal: *** Msk:  Symmetrical without gross deformities. Normal posture. Extremities:  Without edema. Neurologic:  Alert and  oriented x4 Psych:  Alert and cooperative. Normal mood and affect.   Assessment  Jennifer [name] is a 77 y.o. female with a history of chronic diarrhea, distant history of lymphocytic colitis, COPD, depression, GERD, HLD, hypothyroidism, RA presenting today for follow-up.  History of diverticulitis:  Chronic diarrhea:  Weight loss:   PLAN   ***     Venetia Night, MSN, FNP-BC, AGACNP-BC [facility]

## 2022-09-29 ENCOUNTER — Encounter: Payer: Self-pay | Admitting: Gastroenterology

## 2022-09-29 ENCOUNTER — Ambulatory Visit: Payer: Medicare HMO | Admitting: Gastroenterology

## 2022-09-30 DIAGNOSIS — M79671 Pain in right foot: Secondary | ICD-10-CM | POA: Diagnosis not present

## 2022-09-30 DIAGNOSIS — M79672 Pain in left foot: Secondary | ICD-10-CM | POA: Diagnosis not present

## 2022-09-30 DIAGNOSIS — I739 Peripheral vascular disease, unspecified: Secondary | ICD-10-CM | POA: Diagnosis not present

## 2022-09-30 DIAGNOSIS — M79675 Pain in left toe(s): Secondary | ICD-10-CM | POA: Diagnosis not present

## 2022-09-30 DIAGNOSIS — M79674 Pain in right toe(s): Secondary | ICD-10-CM | POA: Diagnosis not present

## 2022-09-30 DIAGNOSIS — M779 Enthesopathy, unspecified: Secondary | ICD-10-CM | POA: Diagnosis not present

## 2022-09-30 DIAGNOSIS — L110 Acquired keratosis follicularis: Secondary | ICD-10-CM | POA: Diagnosis not present

## 2022-10-19 DIAGNOSIS — E039 Hypothyroidism, unspecified: Secondary | ICD-10-CM | POA: Diagnosis not present

## 2022-10-19 DIAGNOSIS — I10 Essential (primary) hypertension: Secondary | ICD-10-CM | POA: Diagnosis not present

## 2022-10-19 DIAGNOSIS — E7800 Pure hypercholesterolemia, unspecified: Secondary | ICD-10-CM | POA: Diagnosis not present

## 2022-10-26 DIAGNOSIS — Z0000 Encounter for general adult medical examination without abnormal findings: Secondary | ICD-10-CM | POA: Diagnosis not present

## 2022-10-27 ENCOUNTER — Other Ambulatory Visit (HOSPITAL_COMMUNITY): Payer: Self-pay | Admitting: Family Medicine

## 2022-10-27 DIAGNOSIS — E782 Mixed hyperlipidemia: Secondary | ICD-10-CM | POA: Diagnosis not present

## 2022-10-27 DIAGNOSIS — R7303 Prediabetes: Secondary | ICD-10-CM | POA: Diagnosis not present

## 2022-10-27 DIAGNOSIS — R600 Localized edema: Secondary | ICD-10-CM | POA: Diagnosis not present

## 2022-10-27 DIAGNOSIS — M545 Low back pain, unspecified: Secondary | ICD-10-CM | POA: Diagnosis not present

## 2022-10-27 DIAGNOSIS — G8929 Other chronic pain: Secondary | ICD-10-CM | POA: Diagnosis not present

## 2022-10-27 DIAGNOSIS — K529 Noninfective gastroenteritis and colitis, unspecified: Secondary | ICD-10-CM | POA: Diagnosis not present

## 2022-10-27 DIAGNOSIS — Z23 Encounter for immunization: Secondary | ICD-10-CM | POA: Diagnosis not present

## 2022-10-27 DIAGNOSIS — R0609 Other forms of dyspnea: Secondary | ICD-10-CM | POA: Diagnosis not present

## 2022-10-27 DIAGNOSIS — J449 Chronic obstructive pulmonary disease, unspecified: Secondary | ICD-10-CM | POA: Diagnosis not present

## 2022-10-27 DIAGNOSIS — R778 Other specified abnormalities of plasma proteins: Secondary | ICD-10-CM | POA: Diagnosis not present

## 2022-10-27 DIAGNOSIS — M810 Age-related osteoporosis without current pathological fracture: Secondary | ICD-10-CM

## 2022-10-27 DIAGNOSIS — E039 Hypothyroidism, unspecified: Secondary | ICD-10-CM | POA: Diagnosis not present

## 2022-10-27 DIAGNOSIS — R945 Abnormal results of liver function studies: Secondary | ICD-10-CM | POA: Diagnosis not present

## 2022-10-31 DIAGNOSIS — M810 Age-related osteoporosis without current pathological fracture: Secondary | ICD-10-CM | POA: Diagnosis not present

## 2022-11-02 IMAGING — US US ABDOMEN COMPLETE
1 series · 13 of 25 positions shown · non-contrast
Comparison: CT scan July 22, 2019. Ultrasound August 29, 2017.

CLINICAL DATA: Abnormal LFTs for 4 days.

EXAM:
ABDOMEN ULTRASOUND COMPLETE

[Series 1: us abdomen complete · 13 of 64 slices shown]
[im 1/64]
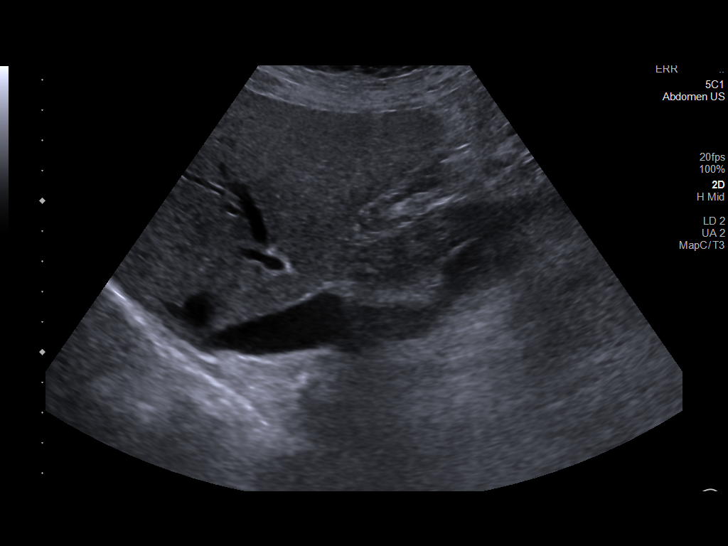
[im 6/64]
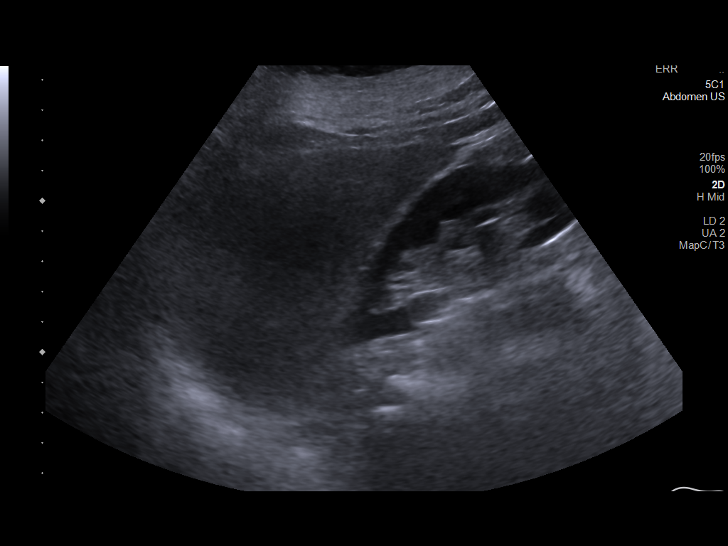
[im 11/64]
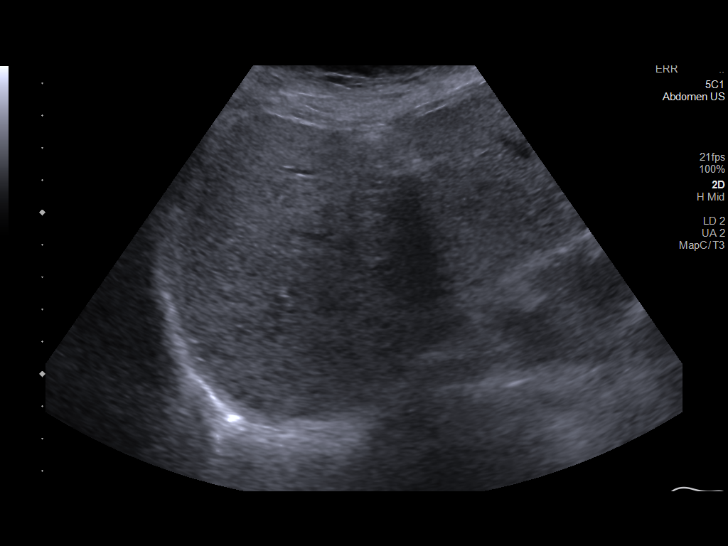
[im 16/64]
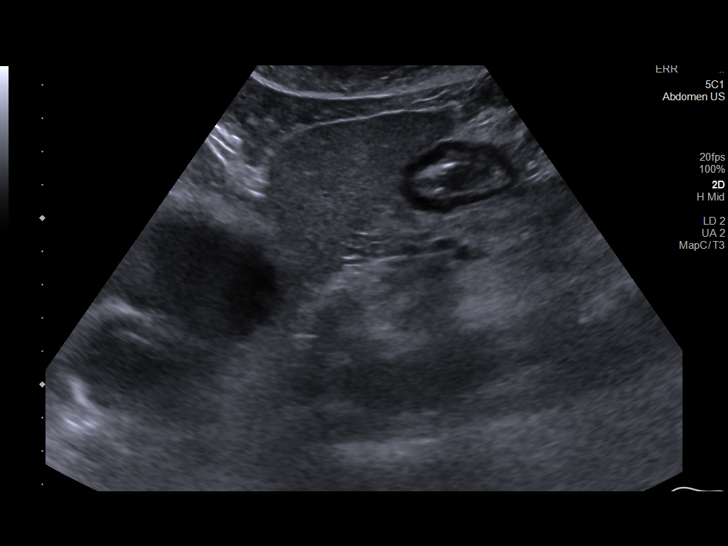
[im 22/64]
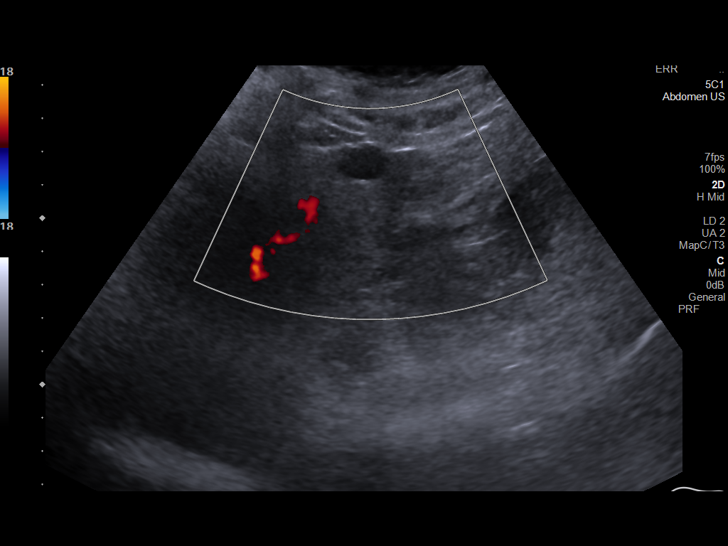
[im 27/64]
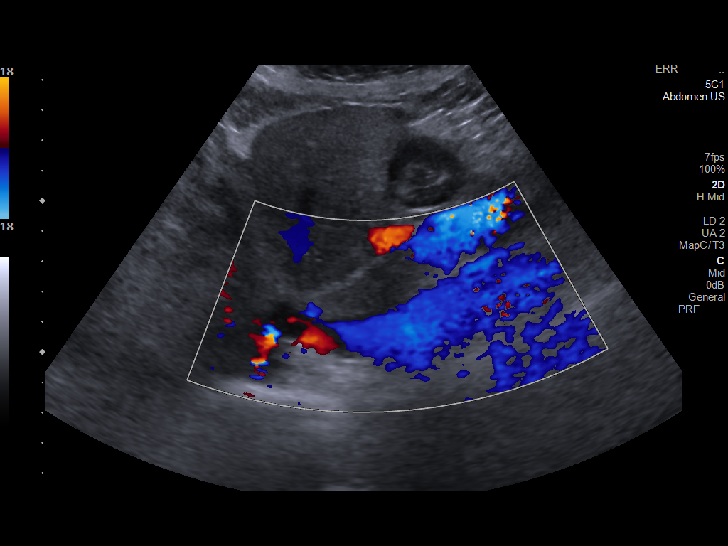
[im 32/64]
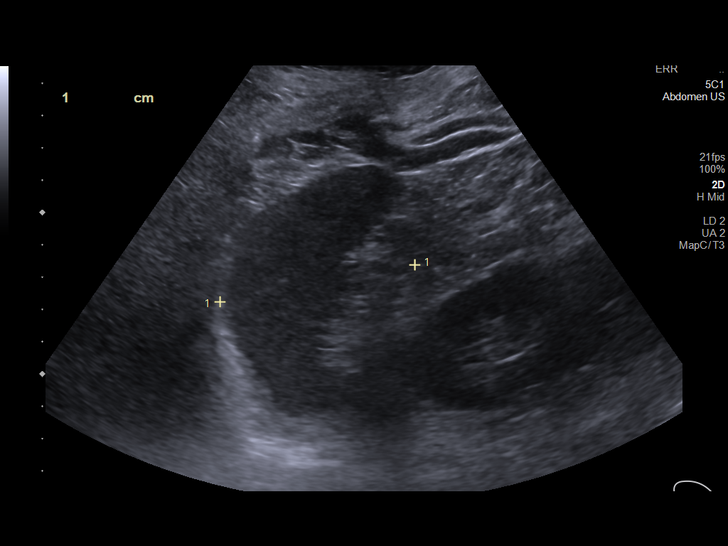
[im 37/64]
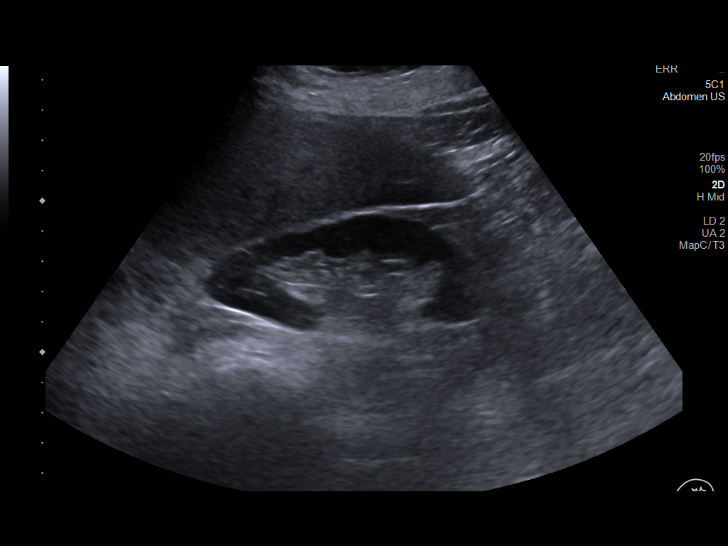
[im 43/64]
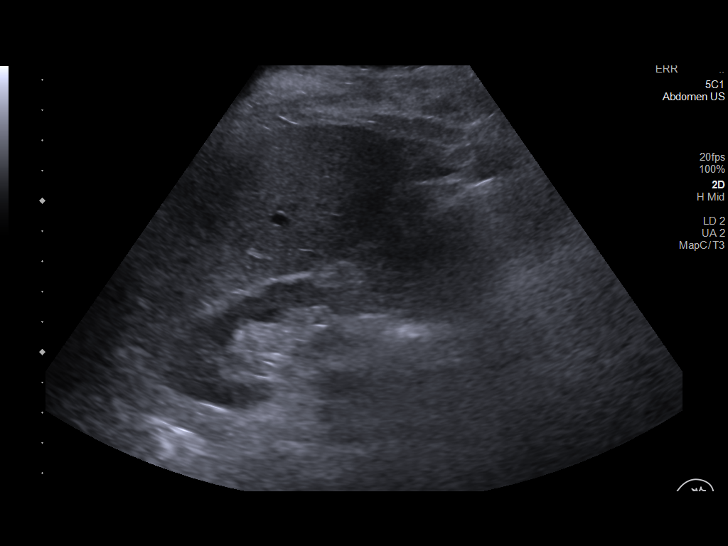
[im 48/64]
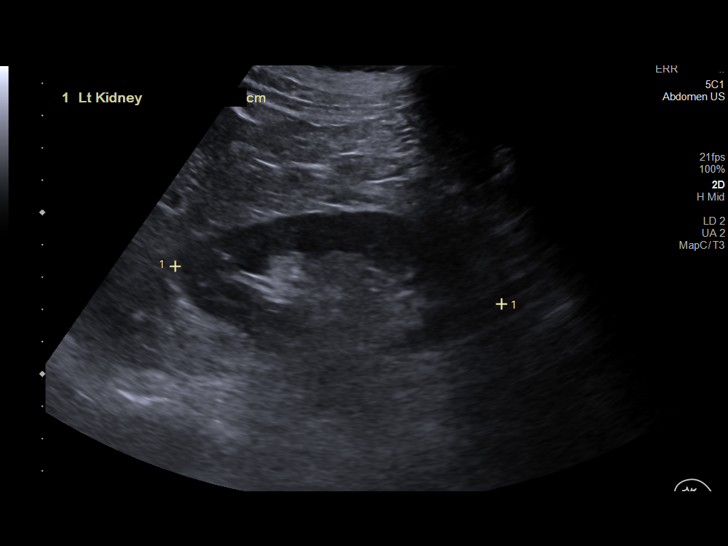
[im 53/64]
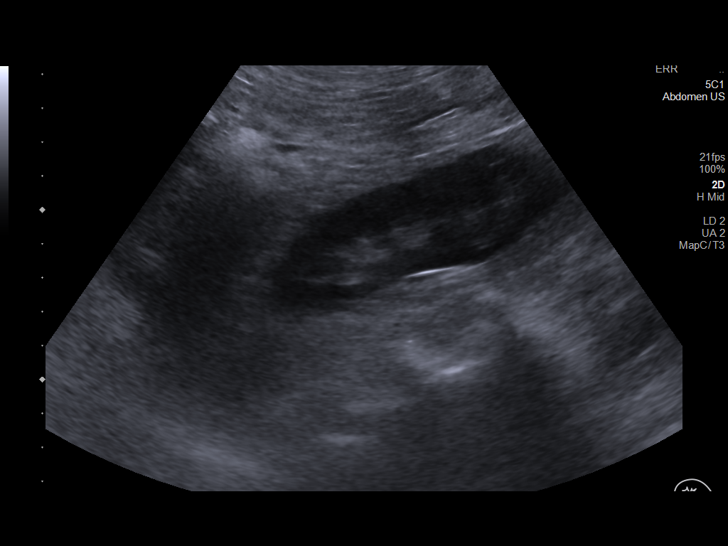
[im 58/64]
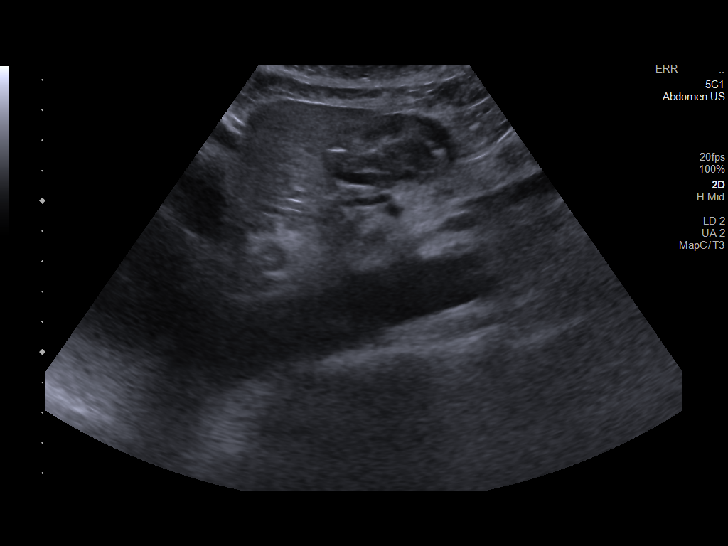
[im 64/64]
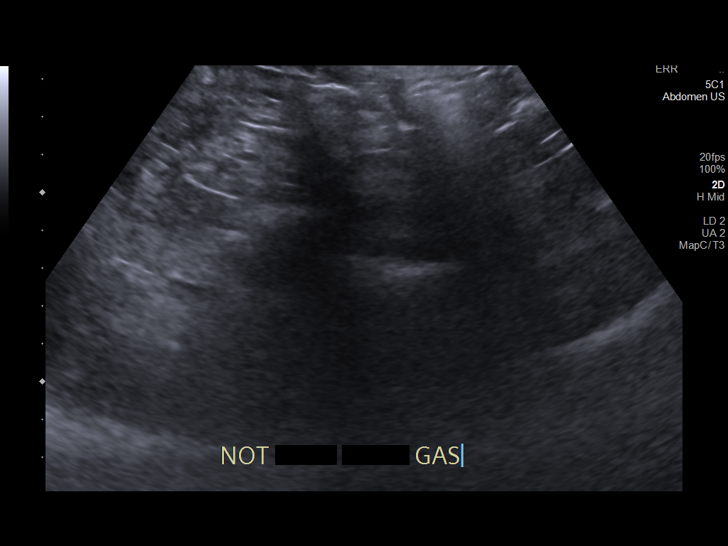

[13 of 25 positions shown; findings below may reference images not displayed]

FINDINGS: Gallbladder: Surgically absent.

Common bile duct: Diameter: 16 mm today versus 15 mm in 5862

Liver: There is a small cyst measuring 1.9 cm in the right hepatic
lobe. Increased echogenicity. No suspicious masses. Portal vein is
patent on color Doppler imaging with normal direction of blood flow
towards the liver.

IVC: No abnormality visualized.

Pancreas: Visualized portion unremarkable.

Spleen: Size and appearance within normal limits.

Right Kidney: Length: 9.4 cm. Echogenicity within normal limits. No
mass or hydronephrosis visualized.

Left Kidney: Length: 10.2 cm. Echogenicity within normal limits. No
mass or hydronephrosis visualized.

Abdominal aorta: No aneurysm visualized.

Other findings: A pericardial effusion is identified.
IMPRESSION: 1. Common bile duct is dilated to 16 mm today, not significantly
changed since 5862 when it measured 14.8 mm. This is likely due to
previous cholecystectomy. Recommend correlation with labs.
2. There is 1.9 cm cyst in the right hepatic lobe.
3. Increased echogenicity in the liver is nonspecific but often due
to hepatic steatosis.
4. A pericardial effusion is incidentally identified but
incompletely evaluated.

## 2022-11-03 ENCOUNTER — Other Ambulatory Visit (HOSPITAL_COMMUNITY): Payer: Medicare HMO

## 2022-11-14 DIAGNOSIS — Z515 Encounter for palliative care: Secondary | ICD-10-CM | POA: Diagnosis not present

## 2022-11-14 DIAGNOSIS — J449 Chronic obstructive pulmonary disease, unspecified: Secondary | ICD-10-CM | POA: Diagnosis not present

## 2022-12-01 ENCOUNTER — Encounter: Payer: Self-pay | Admitting: Gastroenterology

## 2022-12-01 ENCOUNTER — Ambulatory Visit: Payer: Medicare HMO | Admitting: Gastroenterology

## 2022-12-01 VITALS — BP 93/64 | HR 82 | Temp 98.4°F | Ht 63.0 in | Wt 128.4 lb

## 2022-12-01 DIAGNOSIS — K5732 Diverticulitis of large intestine without perforation or abscess without bleeding: Secondary | ICD-10-CM

## 2022-12-01 DIAGNOSIS — K529 Noninfective gastroenteritis and colitis, unspecified: Secondary | ICD-10-CM | POA: Diagnosis not present

## 2022-12-01 DIAGNOSIS — R634 Abnormal weight loss: Secondary | ICD-10-CM

## 2022-12-01 MED ORDER — HYDROCORTISONE (PERIANAL) 2.5 % EX CREA: 1.0000 | TOPICAL_CREAM | Freq: Two times a day (BID) | CUTANEOUS | 1 refills | Status: DC

## 2022-12-01 NOTE — Patient Instructions (Signed)
Let's start off with taking 1 capsule of Zenpep with each meal. Make sure you take it while eating (not before or after). We have room to increase if needed!  Please have the 24 hour urine sample done.  We are arranging a colonoscopy in the near future!  We will see you in follow-up thereafter!  I enjoyed seeing you again today! At our first visit, I mentioned how I value our relationship and want to provide genuine, compassionate, and quality care. You may receive a survey regarding your visit with me, and I welcome your feedback! Thanks so much for taking the time to complete this. I look forward to seeing you again.   [name], PhD, ANP-BC [facility]

## 2022-12-01 NOTE — Progress Notes (Signed)
Gastroenterology Office Note     Primary Care Physician:  Benita Stabile, MD  Primary Gastroenterologist: Dr. Jena Gauss    Chief Complaint   Chief Complaint  Patient presents with   Follow-up    Follow up to reschedule procedures     History of Present Illness   MARCELINE [name] is a 77 y.o. female presenting today in follow-up with a history of chronic diarrhea, distant history of lymphocytic colitis, negative celiac evaluation, last colonoscopy 2020 with tubular adenomas, diverticulitis in Aug 2023, and weight loss. She is here to arrange early interval colonoscopy.  Regarding diarrhea: Fecal elastase normal. Negative small bowel biopsies.dicyclomine has been helpful for abdominal cramping. She had been on entocort 9 mg without improvement. Off label pepto without significant improvement in the past. Diarrhea felt multifactorial in light of history and possible bile salt diarrhea contributing. She has good and bad days, but she consistently has diarrhea.     Abnormal CT 03/01/2022 with asymmetric wall thickening in sigmoid colon, minimal pericolic stranding adjacent to the sigmoid colon, query diverticulitis or neoplastic process. Liver cysts without change from prior. Treated with abx. Needs early interval colonoscopy.    Weight loss: in mid 140s over past year and 135 in Nov 2023. Now 128. She states she eats once a day due to finances. She sometimes does not feel like fixing anything to eat.    Past Medical History:  Diagnosis Date   Complication of anesthesia    difficulty waking up after hysterectomy   COPD (chronic obstructive pulmonary disease) (HCC)    Depression    GERD (gastroesophageal reflux disease)    History of colon polyps    remote past, unable to retrieve records    Hyperlipidemia    Hypothyroidism    Lymphocytic colitis    Rheumatoid arthritis (HCC)     Past Surgical History:  Procedure Laterality Date   cataract  surgery Bilateral    COLONOSCOPY      last one was about 3 yrs in [facility]   COLONOSCOPY N/A 02/19/2016   Dr. Jena Gauss: three 3-6 mm polyps (tubular adenomas) in ascending colon, segmental biopsies consistent with lymphocytic colitis. Prescribed entocort.    COLONOSCOPY N/A 03/06/2019   Dr. Jena Gauss: 2 polyps removed, tubular adenomas, diverticulosis.  NO random colon biopsies performed.  Next colonoscopy 5 years.   ESOPHAGOGASTRODUODENOSCOPY N/A 08/11/2014   RMR: MIld erosive  reflux esophagitis. small hiatal hernia. Abnormal gastric muocsa uncertain significance. Subtly abnormal duodeanl (bulbar) mucosa- status post multiple biopsies. BENIGN small bowel mucosa, no evidence of villous blulnting, mild reactive gastropathy on stomach biopsy    ESOPHAGOGASTRODUODENOSCOPY (EGD) WITH PROPOFOL N/A 05/31/2021   normal   GALLBLADDER SURGERY     LEG SURGERY     right   MALONEY DILATION N/A 05/31/2021   Procedure: [name] DILATION;  Surgeon: Corbin Ade, MD;  Location: [facility];  Service: Endoscopy;  Laterality: N/A;   PARTIAL HYSTERECTOMY      Current Outpatient Medications  Medication Sig Dispense Refill   albuterol (VENTOLIN HFA) 108 (90 Base) MCG/ACT inhaler Inhale 1-2 puffs into the lungs every 6 (six) hours as needed for shortness of breath or wheezing.     BEVESPI AEROSPHERE 9-4.8 MCG/ACT AERO Inhale 2 puffs into the lungs 2 (two) times daily as needed (shortness of breath).     bismuth subsalicylate (STOMACH RELIEF) 262 MG chewable tablet chew 3 TABLETS BY MOUTH THREE TIMES DAILY 270 tablet 1   Calcium Carb-Cholecalciferol (CALCIUM 600+D3  PO) Take 1 tablet by mouth in the morning.     cycloSPORINE (RESTASIS) 0.05 % ophthalmic emulsion Place 1 drop into both eyes in the morning, at noon, and at bedtime.     dicyclomine (BENTYL) 10 MG capsule Take 10 mg by mouth 4 (four) times daily -  before meals and at bedtime.     fluticasone (FLONASE) 50 MCG/ACT nasal spray Place 1 spray into both nostrils daily as needed for  allergies.     hydrocortisone (ANUSOL-HC) 2.5 % rectal cream Place 1 Application rectally 2 (two) times daily. To rectum for hemorrhoid discomfort or bleeding (Patient taking differently: Place 1 Application rectally 2 (two) times daily as needed for anal itching or hemorrhoids.) 30 g 1   hydrOXYzine (VISTARIL) 25 MG capsule Take 25 mg by mouth daily as needed for itching.     ipratropium-albuterol (DUONEB) 0.5-2.5 (3) MG/3ML SOLN Inhale 3 mLs into the lungs every 6 (six) hours as needed (wheezing/shortness of breath).     ketoconazole (NIZORAL) 2 % cream Apply 1 Application topically 2 (two) times daily. To between buttocks folds for yeast. (Patient taking differently: Apply 1 Application topically 2 (two) times daily as needed (skin irritation (yeast)). To between buttocks folds for yeast.) 15 g 0   levothyroxine (SYNTHROID) 50 MCG tablet Take 50 mcg by mouth daily before breakfast.     methocarbamol (ROBAXIN) 500 MG tablet Take 500 mg by mouth daily as needed for muscle spasms.     mirabegron ER (MYRBETRIQ) 25 MG TB24 tablet Take 25 mg by mouth at bedtime.     ondansetron (ZOFRAN-ODT) 4 MG disintegrating tablet Take 4 mg by mouth 2 (two) times daily as needed for nausea.     potassium chloride SA (KLOR-CON M) 20 MEQ tablet Take 20 mEq by mouth See admin instructions. Take 1 tablet (20 meq) by mouth scheduled in the morning, may take an additional dose in the afternoon if taking 2 tablets of furosemide.     PROLIA 60 MG/ML SOSY injection Inject 60 mg into the skin every 6 (six) months.     simvastatin (ZOCOR) 40 MG tablet Take 40 mg by mouth at bedtime.     sodium chloride (OCEAN) 0.65 % SOLN nasal spray Place 1 spray into both nostrils as needed for congestion.     furosemide (LASIX) 20 MG tablet Take 20 mg by mouth daily as needed (feet swelling/fluid retention.). (Patient not taking: Reported on 12/01/2022)     No current facility-administered medications for this visit.    Allergies as of  12/01/2022 - Review Complete 12/01/2022  Allergen Reaction Noted   Desyrel [trazodone] Other (See Comments) 07/14/2014   Keflex [cephalexin] Other (See Comments) 07/14/2014   Klonopin [clonazepam] Other (See Comments) 07/14/2014   Minocin [minocycline hcl] Other (See Comments) 07/14/2014   Stelazine [trifluoperazine]  07/14/2014   Viberzi [eluxadoline]  03/31/2015    Family History  Problem Relation Age of Onset   Cancer Sister        blood disorder ??   Colon cancer Neg Hx     Social History   Socioeconomic History   Marital status: Widowed    Spouse name: Not on file   Number of children: 2   Years of education: Not on file   Highest education level: Not on file  Occupational History   Occupation: disability  Tobacco Use   Smoking status: Every Day    Packs/day: .25    Types: Cigarettes    Passive exposure: Current  Smokeless tobacco: Never   Tobacco comments:    quit 03/31/18  Vaping Use   Vaping Use: Never used  Substance and Sexual Activity   Alcohol use: No    Alcohol/week: 0.0 standard drinks of alcohol   Drug use: No   Sexual activity: Not Currently  Other Topics Concern   Not on file  Social History Narrative   Not on file   Social Determinants of Health   Financial Resource Strain: Not on file  Food Insecurity: Not on file  Transportation Needs: Not on file  Physical Activity: Not on file  Stress: Not on file  Social Connections: Not on file  Intimate Partner Violence: Not on file     Review of Systems   Gen: Denies any fever, chills, fatigue, weight loss, lack of appetite.  CV: Denies chest pain, heart palpitations, peripheral edema, syncope.  Resp: Denies shortness of breath at rest or with exertion. Denies wheezing or cough.  GI: Denies dysphagia or odynophagia. Denies jaundice, hematemesis, fecal incontinence. GU : Denies urinary burning, urinary frequency, urinary hesitancy MS: Denies joint pain, muscle weakness, cramps, or limitation  of movement.  Derm: Denies rash, itching, dry skin Psych: Denies depression, anxiety, memory loss, and confusion Heme: Denies bruising, bleeding, and enlarged lymph nodes.   Physical Exam   BP 93/64   Pulse 82   Temp 98.4 F (36.9 C)   Ht 5\' 3"  (1.6 m)   Wt 128 lb 6.4 oz (58.2 kg)   BMI 22.75 kg/m  General:   Alert and oriented. Pleasant and cooperative. Well-nourished and well-developed.  Head:  Normocephalic and atraumatic. Eyes:  Without icterus Abdomen:  +BS, soft, non-tender and non-distended. No HSM noted. No guarding or rebound. No masses appreciated.  Rectal:  Deferred  Msk:  Symmetrical without gross deformities. Normal posture. Extremities:  Without edema. Neurologic:  Alert and  oriented x4;  grossly normal neurologically. Skin:  Intact without significant lesions or rashes. Psych:  Alert and cooperative. Normal mood and affect.   Assessment   Jennifer [name] is a 77 y.o. female presenting today in follow-up with a history of chronic diarrhea, distant history of lymphocytic colitis, colonic adenomas, weight loss, and diverticulitis in Aug 2023. She returns today to arrange early interval colonoscopy.  Diverticulitis: noted on imaging in Aug 2023. She has recovered well from this. As last colonoscopy in 2020 (with adenomas), needs early interval colonoscopy to rule out occult lesion.  Chronic diarrhea: with distant history of lymphocytic colitis. Previously on entocort without much improvement, pepto off-label without significant improvement. Fecal elastase normal, negative small bowel biopsies, negative celiac serologies. Diarrhea felt multifactorial in light of history and possible bile salt diarrhea contributing. Will trial Zenpep. May benefit from Questran or Colestid. Checking 24 hour 5HIAA to be thorough.   Weight loss:  in mid 140s over past year and 135 in Nov 2023. Now 128. No food aversions. Recommend CTA if progressive weight loss, postprandial abdominal pain,  food aversion, etc. Will follow closely. I suspect depression playing a role as well, along with limited resources as she is eating once per day. We discussed meals on wheels and the food pantry. She has good support with her sister in law.     PLAN    Proceed with colonoscopy by Dr. Jena Gauss in near future with random colonic biopsies: the risks, benefits, and alternatives have been discussed with the patient in detail. The patient states understanding and desires to proceed. Trial of Zenpep weight-based dosing. Will  start with 1 capsule with meals and snacks, but she may benefit from increasing (this is weight-based dosing).  24 hour 5HIAA Follow-up thereafter   [name], PhD, ANP-BC [facility]

## 2022-12-02 ENCOUNTER — Encounter: Payer: Self-pay | Admitting: *Deleted

## 2022-12-02 ENCOUNTER — Other Ambulatory Visit: Payer: Self-pay | Admitting: *Deleted

## 2022-12-02 MED ORDER — PEG 3350-KCL-NA BICARB-NACL 420 G PO SOLR: 4000.0000 mL | Freq: Once | ORAL | 0 refills | Status: AC

## 2022-12-05 ENCOUNTER — Encounter: Payer: Self-pay | Admitting: *Deleted

## 2022-12-12 DIAGNOSIS — M79675 Pain in left toe(s): Secondary | ICD-10-CM | POA: Diagnosis not present

## 2022-12-12 DIAGNOSIS — M79672 Pain in left foot: Secondary | ICD-10-CM | POA: Diagnosis not present

## 2022-12-12 DIAGNOSIS — M79674 Pain in right toe(s): Secondary | ICD-10-CM | POA: Diagnosis not present

## 2022-12-12 DIAGNOSIS — M779 Enthesopathy, unspecified: Secondary | ICD-10-CM | POA: Diagnosis not present

## 2022-12-12 DIAGNOSIS — M79671 Pain in right foot: Secondary | ICD-10-CM | POA: Diagnosis not present

## 2022-12-12 DIAGNOSIS — L110 Acquired keratosis follicularis: Secondary | ICD-10-CM | POA: Diagnosis not present

## 2022-12-12 DIAGNOSIS — I739 Peripheral vascular disease, unspecified: Secondary | ICD-10-CM | POA: Diagnosis not present

## 2022-12-21 DIAGNOSIS — K529 Noninfective gastroenteritis and colitis, unspecified: Secondary | ICD-10-CM | POA: Diagnosis not present

## 2022-12-24 LAB — 5 HIAA, QUANTITATIVE, URINE, 24 HOUR
5-HIAA, Ur: 5.4 mg/L
5-HIAA,Quant.,24 Hr Urine: 6.8 mg/24 hr (ref 0.0–14.9)

## 2022-12-27 DIAGNOSIS — J019 Acute sinusitis, unspecified: Secondary | ICD-10-CM | POA: Diagnosis not present

## 2023-01-05 ENCOUNTER — Other Ambulatory Visit: Payer: Self-pay | Admitting: Gastroenterology

## 2023-01-05 DIAGNOSIS — R197 Diarrhea, unspecified: Secondary | ICD-10-CM

## 2023-01-06 DIAGNOSIS — Z515 Encounter for palliative care: Secondary | ICD-10-CM | POA: Diagnosis not present

## 2023-01-06 DIAGNOSIS — J449 Chronic obstructive pulmonary disease, unspecified: Secondary | ICD-10-CM | POA: Diagnosis not present

## 2023-01-09 ENCOUNTER — Telehealth: Payer: Self-pay | Admitting: *Deleted

## 2023-01-09 NOTE — Telephone Encounter (Signed)
Pt called to say she didn't have her pre-op date.   Informed pt that her pre-op is scheduled for 01/10/23 at 10:00 am . Pt verbalized understanding.

## 2023-01-09 NOTE — Patient Instructions (Signed)
Jennifer [name]  01/09/2023     @PREFPERIOPPHARMACY @   Your procedure is scheduled on  01/12/2023.   Report to [name] at  0745 A.M.   Call this number if you have problems the morning of surgery:  [phone]  If you experience any cold or flu symptoms such as cough, fever, chills, shortness of breath, etc. between now and your scheduled surgery, please notify us at the above number.   Remember:  Follow the diet and prep instructions given to you by the office.      Use your nebulizer and your inhalers before you come and bring your rescue inhaler with you.     Take these medicines the morning of surgery with A SIP OF WATER        hydroxyzine, levothyroxine, robaxin (if needed), zofran (if needed).     Do not wear jewelry, make-up or nail polish, including gel polish,  artificial nails, or any other type of covering on natural nails (fingers and  toes).  Do not wear lotions, powders, or perfumes, or deodorant.  Do not shave 48 hours prior to surgery.  Men may shave face and neck.  Do not bring valuables to the hospital.  [facility] is not responsible for any belongings or valuables.  Contacts, dentures or bridgework may not be worn into surgery.  Leave your suitcase in the car.  After surgery it may be brought to your room.  For patients admitted to the hospital, discharge time will be determined by your treatment team.  Patients discharged the day of surgery will not be allowed to drive home and must have someone with them for 24 hours.    Special instructions:   DO NOT smoke tobacco or vape for 24 hours before your procedure.  Please read over the following fact sheets that you were given. Anesthesia Post-op Instructions and Care and Recovery After Surgery      Colonoscopy, Adult, Care After The following information offers guidance on how to care for yourself after your procedure. Your health care provider may also give you more specific instructions.  If you have problems or questions, contact your health care provider. What can I expect after the procedure? After the procedure, it is common to have: A small amount of blood in your stool for 24 hours after the procedure. Some gas. Mild cramping or bloating of your abdomen. Follow these instructions at home: Eating and drinking  Drink enough fluid to keep your urine pale yellow. Follow instructions from your health care provider about eating or drinking restrictions. Resume your normal diet as told by your health care provider. Avoid heavy or fried foods that are hard to digest. Activity Rest as told by your health care provider. Avoid sitting for a long time without moving. Get up to take short walks every 1-2 hours. This is important to improve blood flow and breathing. Ask for help if you feel weak or unsteady. Return to your normal activities as told by your health care provider. Ask your health care provider what activities are safe for you. Managing cramping and bloating  Try walking around when you have cramps or feel bloated. If directed, apply heat to your abdomen as told by your health care provider. Use the heat source that your health care provider recommends, such as a moist heat pack or a heating pad. Place a towel between your skin and the heat source. Leave the heat on for 20-30 minutes.  Remove the heat if your skin turns bright red. This is especially important if you are unable to feel pain, heat, or cold. You have a greater risk of getting burned. General instructions If you were given a sedative during the procedure, it can affect you for several hours. Do not drive or operate machinery until your health care provider says that it is safe. For the first 24 hours after the procedure: Do not sign important documents. Do not drink alcohol. Do your regular daily activities at a slower pace than normal. Eat soft foods that are easy to digest. Take over-the-counter and  prescription medicines only as told by your health care provider. Keep all follow-up visits. This is important. Contact a health care provider if: You have blood in your stool 2-3 days after the procedure. Get help right away if: You have more than a small spotting of blood in your stool. You have large blood clots in your stool. You have swelling of your abdomen. You have nausea or vomiting. You have a fever. You have increasing pain in your abdomen that is not relieved with medicine. These symptoms may be an emergency. Get help right away. Call 911. Do not wait to see if the symptoms will go away. Do not drive yourself to the hospital. Summary After the procedure, it is common to have a small amount of blood in your stool. You may also have mild cramping and bloating of your abdomen. If you were given a sedative during the procedure, it can affect you for several hours. Do not drive or operate machinery until your health care provider says that it is safe. Get help right away if you have a lot of blood in your stool, nausea or vomiting, a fever, or increased pain in your abdomen. This information is not intended to replace advice given to you by your health care provider. Make sure you discuss any questions you have with your health care provider. Document Revised: 08/30/2022 Document Reviewed: 03/10/2021 Elsevier Patient Education  2024 [employer] Monitored Anesthesia Care, Care After The following information offers guidance on how to care for yourself after your procedure. Your health care provider may also give you more specific instructions. If you have problems or questions, contact your health care provider. What can I expect after the procedure? After the procedure, it is common to have: Tiredness. Little or no memory about what happened during or after the procedure. Impaired judgment when it comes to making decisions. Nausea or vomiting. Some trouble with balance. Follow  these instructions at home: For the time period you were told by your health care provider:  Rest. Do not participate in activities where you could fall or become injured. Do not drive or use machinery. Do not drink alcohol. Do not take sleeping pills or medicines that cause drowsiness. Do not make important decisions or sign legal documents. Do not take care of children on your own. Medicines Take over-the-counter and prescription medicines only as told by your health care provider. If you were prescribed antibiotics, take them as told by your health care provider. Do not stop using the antibiotic even if you start to feel better. Eating and drinking Follow instructions from your health care provider about what you may eat and drink. Drink enough fluid to keep your urine pale yellow. If you vomit: Drink clear fluids slowly and in small amounts as you are able. Clear fluids include water, ice chips, low-calorie sports drinks, and fruit juice that has water  added to it (diluted fruit juice). Eat light and bland foods in small amounts as you are able. These foods include bananas, applesauce, rice, lean meats, toast, and crackers. General instructions  Have a responsible adult stay with you for the time you are told. It is important to have someone help care for you until you are awake and alert. If you have sleep apnea, surgery and some medicines can increase your risk for breathing problems. Follow instructions from your health care provider about wearing your sleep device: When you are sleeping. This includes during daytime naps. While taking prescription pain medicines, sleeping medicines, or medicines that make you drowsy. Do not use any products that contain nicotine or tobacco. These products include cigarettes, chewing tobacco, and vaping devices, such as e-cigarettes. If you need help quitting, ask your health care provider. Contact a health care provider if: You feel nauseous or  vomit every time you eat or drink. You feel light-headed. You are still sleepy or having trouble with balance after 24 hours. You get a rash. You have a fever. You have redness or swelling around the IV site. Get help right away if: You have trouble breathing. You have new confusion after you get home. These symptoms may be an emergency. Get help right away. Call 911. Do not wait to see if the symptoms will go away. Do not drive yourself to the hospital. This information is not intended to replace advice given to you by your health care provider. Make sure you discuss any questions you have with your health care provider. Document Revised: 12/13/2021 Document Reviewed: 12/13/2021 Elsevier Patient Education  2024 [employer].

## 2023-01-10 ENCOUNTER — Encounter (HOSPITAL_COMMUNITY): Payer: Self-pay

## 2023-01-10 ENCOUNTER — Encounter (HOSPITAL_COMMUNITY)
Admission: RE | Admit: 2023-01-10 | Discharge: 2023-01-10 | Disposition: A | Discharge status: Home / Self Care | Payer: Medicare HMO | Source: Ambulatory Visit | Attending: Internal Medicine | Admitting: Internal Medicine

## 2023-01-10 VITALS — BP 90/63 | HR 87 | Temp 98.4°F | Resp 18 | Ht 63.0 in | Wt 128.3 lb

## 2023-01-10 DIAGNOSIS — Z01812 Encounter for preprocedural laboratory examination: Secondary | ICD-10-CM | POA: Insufficient documentation

## 2023-01-10 DIAGNOSIS — Z8639 Personal history of other endocrine, nutritional and metabolic disease: Secondary | ICD-10-CM | POA: Insufficient documentation

## 2023-01-10 HISTORY — DX: Essential (primary) hypertension: I10

## 2023-01-10 HISTORY — DX: Fatty (change of) liver, not elsewhere classified: K76.0

## 2023-01-10 LAB — BASIC METABOLIC PANEL
Anion gap: 8 (ref 5–15)
BUN: 6 mg/dL — ABNORMAL LOW (ref 8–23)
CO2: 27 mmol/L (ref 22–32)
Calcium: 8.6 mg/dL — ABNORMAL LOW (ref 8.9–10.3)
Chloride: 100 mmol/L (ref 98–111)
Creatinine, Ser: 0.79 mg/dL (ref 0.44–1.00)
GFR, Estimated: >60 mL/min (ref >60)
Glucose, Bld: 67 mg/dL — ABNORMAL LOW (ref 70–99)
Potassium: 3.5 mmol/L (ref 3.5–5.1)
Sodium: 135 mmol/L (ref 135–145)

## 2023-01-11 ENCOUNTER — Encounter (HOSPITAL_COMMUNITY): Payer: Self-pay | Admitting: Anesthesiology

## 2023-01-12 ENCOUNTER — Ambulatory Visit (HOSPITAL_COMMUNITY): Admission: RE | Admit: 2023-01-12 | Payer: Medicare HMO | Source: Home / Self Care | Admitting: Internal Medicine

## 2023-01-12 ENCOUNTER — Telehealth: Payer: Self-pay | Admitting: *Deleted

## 2023-01-12 SURGERY — COLONOSCOPY WITH PROPOFOL
Anesthesia: Monitor Anesthesia Care

## 2023-01-12 NOTE — Progress Notes (Signed)
Pt called and stated that she needed to cancel her colonoscopy procedure for today. Stated that she did not feel well.

## 2023-01-12 NOTE — Telephone Encounter (Signed)
Received called from endo patient did not show for procedure today. They called patient and said she told them she was not feeling well so she wasn't coming. Patient did not call us. We will have to wait and reschedule her once we get August schedule. Sent message to [place] in endo making aware. FYI Dr. Jena Gauss

## 2023-01-12 NOTE — Progress Notes (Signed)
Dr Luvenia Starch office notified of cancellation.

## 2023-01-24 ENCOUNTER — Other Ambulatory Visit (HOSPITAL_COMMUNITY): Payer: Self-pay | Admitting: Family Medicine

## 2023-01-24 ENCOUNTER — Ambulatory Visit (HOSPITAL_COMMUNITY)
Admission: RE | Admit: 2023-01-24 | Discharge: 2023-01-24 | Disposition: A | Discharge status: Home / Self Care | Payer: Medicare HMO | Source: Ambulatory Visit | Attending: Family Medicine | Admitting: Family Medicine

## 2023-01-24 DIAGNOSIS — M79642 Pain in left hand: Secondary | ICD-10-CM | POA: Diagnosis not present

## 2023-01-24 DIAGNOSIS — M79645 Pain in left finger(s): Secondary | ICD-10-CM

## 2023-01-24 DIAGNOSIS — M810 Age-related osteoporosis without current pathological fracture: Secondary | ICD-10-CM | POA: Diagnosis not present

## 2023-02-06 ENCOUNTER — Encounter: Payer: Self-pay | Admitting: Gastroenterology

## 2023-02-06 ENCOUNTER — Ambulatory Visit: Payer: Medicare HMO | Admitting: Gastroenterology

## 2023-02-15 NOTE — Telephone Encounter (Signed)
Called pt to reschedule her procedure. She wants to come in and discuss this with Jennifer [name] or Dr. Jena Gauss. Reports she had tried this 3 times now. The prep makes her sick or the prep doesn't work or it works too much.   Mandy please schedule per pt request.

## 2023-02-20 DIAGNOSIS — Z515 Encounter for palliative care: Secondary | ICD-10-CM | POA: Diagnosis not present

## 2023-02-20 DIAGNOSIS — M79671 Pain in right foot: Secondary | ICD-10-CM | POA: Diagnosis not present

## 2023-02-20 DIAGNOSIS — L110 Acquired keratosis follicularis: Secondary | ICD-10-CM | POA: Diagnosis not present

## 2023-02-20 DIAGNOSIS — B370 Candidal stomatitis: Secondary | ICD-10-CM | POA: Diagnosis not present

## 2023-02-20 DIAGNOSIS — M79674 Pain in right toe(s): Secondary | ICD-10-CM | POA: Diagnosis not present

## 2023-02-20 DIAGNOSIS — M79675 Pain in left toe(s): Secondary | ICD-10-CM | POA: Diagnosis not present

## 2023-02-20 DIAGNOSIS — Z79899 Other long term (current) drug therapy: Secondary | ICD-10-CM | POA: Diagnosis not present

## 2023-02-20 DIAGNOSIS — M779 Enthesopathy, unspecified: Secondary | ICD-10-CM | POA: Diagnosis not present

## 2023-02-20 DIAGNOSIS — J449 Chronic obstructive pulmonary disease, unspecified: Secondary | ICD-10-CM | POA: Diagnosis not present

## 2023-02-20 DIAGNOSIS — M79672 Pain in left foot: Secondary | ICD-10-CM | POA: Diagnosis not present

## 2023-02-20 DIAGNOSIS — I739 Peripheral vascular disease, unspecified: Secondary | ICD-10-CM | POA: Diagnosis not present

## 2023-02-27 DIAGNOSIS — E7800 Pure hypercholesterolemia, unspecified: Secondary | ICD-10-CM | POA: Diagnosis not present

## 2023-02-27 DIAGNOSIS — R7301 Impaired fasting glucose: Secondary | ICD-10-CM | POA: Diagnosis not present

## 2023-02-27 DIAGNOSIS — E039 Hypothyroidism, unspecified: Secondary | ICD-10-CM | POA: Diagnosis not present

## 2023-02-27 DIAGNOSIS — I10 Essential (primary) hypertension: Secondary | ICD-10-CM | POA: Diagnosis not present

## 2023-03-06 DIAGNOSIS — R7303 Prediabetes: Secondary | ICD-10-CM | POA: Diagnosis not present

## 2023-03-06 DIAGNOSIS — K7689 Other specified diseases of liver: Secondary | ICD-10-CM | POA: Diagnosis not present

## 2023-03-06 DIAGNOSIS — N3281 Overactive bladder: Secondary | ICD-10-CM | POA: Diagnosis not present

## 2023-03-06 DIAGNOSIS — R778 Other specified abnormalities of plasma proteins: Secondary | ICD-10-CM | POA: Diagnosis not present

## 2023-03-06 DIAGNOSIS — K529 Noninfective gastroenteritis and colitis, unspecified: Secondary | ICD-10-CM | POA: Diagnosis not present

## 2023-03-06 DIAGNOSIS — K5730 Diverticulosis of large intestine without perforation or abscess without bleeding: Secondary | ICD-10-CM | POA: Insufficient documentation

## 2023-03-06 DIAGNOSIS — M545 Low back pain, unspecified: Secondary | ICD-10-CM | POA: Diagnosis not present

## 2023-03-06 DIAGNOSIS — M810 Age-related osteoporosis without current pathological fracture: Secondary | ICD-10-CM | POA: Diagnosis not present

## 2023-03-06 DIAGNOSIS — G8929 Other chronic pain: Secondary | ICD-10-CM | POA: Diagnosis not present

## 2023-03-06 DIAGNOSIS — E782 Mixed hyperlipidemia: Secondary | ICD-10-CM | POA: Diagnosis not present

## 2023-03-06 DIAGNOSIS — E039 Hypothyroidism, unspecified: Secondary | ICD-10-CM | POA: Diagnosis not present

## 2023-03-06 DIAGNOSIS — J449 Chronic obstructive pulmonary disease, unspecified: Secondary | ICD-10-CM | POA: Diagnosis not present

## 2023-03-06 DIAGNOSIS — R600 Localized edema: Secondary | ICD-10-CM | POA: Diagnosis not present

## 2023-03-07 ENCOUNTER — Encounter: Payer: Self-pay | Admitting: Internal Medicine

## 2023-03-07 ENCOUNTER — Ambulatory Visit: Payer: Medicare HMO | Admitting: Internal Medicine

## 2023-03-07 VITALS — BP 130/75 | HR 81 | Temp 97.6°F | Ht 64.0 in | Wt 130.0 lb

## 2023-03-07 DIAGNOSIS — K52832 Lymphocytic colitis: Secondary | ICD-10-CM | POA: Diagnosis not present

## 2023-03-07 DIAGNOSIS — Z86010 Personal history of colonic polyps: Secondary | ICD-10-CM

## 2023-03-07 NOTE — Patient Instructions (Addendum)
We will reschedule a colonoscopy.  History of colonic polyps.  Abnormal: In the setting of diverticulitis.  ASA 3  Stop taking dicyclomine/Bentyl.    Take Zofran 4 mg orally disintegrating tablets-start 2 days before the procedure take 1 under the tongue every 8 hours x 2 days (total of 6 doses).  No refills.

## 2023-03-07 NOTE — Progress Notes (Unsigned)
Primary Care Physician:  Benita Stabile, MD Primary Gastroenterologist:  Dr.   Pre-Procedure History & Physical: HPI:  Jennifer [name] is a 77 y.o. female here for to set up a another attempted colonoscopy.  Schedule colonoscopy was canceled due to inadequate preparation in June.  History of lymphocytic colitis and chronic diarrhea, history of colonic adenoma and abnormal colon on CT in the setting of diverticulitis last year.  Patient states she has no more abdominal pain and is not having any more diarrhea.  Has gained 5 pounds since her last visit. Urine 5-HIAA came back within normal limits.  Has tremendous nausea with colonoscopy preparation.    Past Medical History:  Diagnosis Date   Complication of anesthesia    difficulty waking up after hysterectomy   COPD (chronic obstructive pulmonary disease) (HCC)    Depression    Fatty liver    GERD (gastroesophageal reflux disease)    History of colon polyps    remote past, unable to retrieve records    Hyperlipidemia    Hypertension    Hypothyroidism    Lymphocytic colitis    Rheumatoid arthritis (HCC)     Past Surgical History:  Procedure Laterality Date   cataract  surgery Bilateral    COLONOSCOPY     last one was about 3 yrs in [facility]   COLONOSCOPY N/A 02/19/2016   Dr. Jena Gauss: three 3-6 mm polyps (tubular adenomas) in ascending colon, segmental biopsies consistent with lymphocytic colitis. Prescribed entocort.    COLONOSCOPY N/A 03/06/2019   Dr. Jena Gauss: 2 polyps removed, tubular adenomas, diverticulosis.  NO random colon biopsies performed.  Next colonoscopy 5 years.   ESOPHAGOGASTRODUODENOSCOPY N/A 08/11/2014   RMR: MIld erosive  reflux esophagitis. small hiatal hernia. Abnormal gastric muocsa uncertain significance. Subtly abnormal duodeanl (bulbar) mucosa- status post multiple biopsies. BENIGN small bowel mucosa, no evidence of villous blulnting, mild reactive gastropathy on stomach biopsy     ESOPHAGOGASTRODUODENOSCOPY (EGD) WITH PROPOFOL N/A 05/31/2021   normal   GALLBLADDER SURGERY     LEG SURGERY     right   MALONEY DILATION N/A 05/31/2021   Procedure: [name] DILATION;  Surgeon: Corbin Ade, MD;  Location: [facility];  Service: Endoscopy;  Laterality: N/A;   PARTIAL HYSTERECTOMY      Prior to Admission medications   Medication Sig Start Date End Date Taking? Authorizing Provider  acetaminophen (TYLENOL) 650 MG CR tablet Take 650 mg by mouth every 8 (eight) hours as needed for pain.   Yes [provider]  albuterol (VENTOLIN HFA) 108 (90 Base) MCG/ACT inhaler Inhale 1-2 puffs into the lungs every 6 (six) hours as needed for shortness of breath or wheezing.   Yes [provider]  BEVESPI AEROSPHERE 9-4.8 MCG/ACT AERO Inhale 2 puffs into the lungs 2 (two) times daily as needed (shortness of breath). 05/11/21  Yes [provider]  bismuth subsalicylate (STOMACH RELIEF) 262 MG chewable tablet chew 3 TABLETS BY MOUTH THREE TIMES DAILY 06/09/22  Yes [name], NP  Calcium Carb-Cholecalciferol (CALCIUM 600+D3 PO) Take 2 tablets by mouth in the morning.   Yes [provider]  Camphor-Menthol-Methyl Sal (SALONPAS) 3.08-06-08 % PTCH Apply 1 Application topically daily as needed (pain).   Yes [provider]  diclofenac Sodium (VOLTAREN) 1 % GEL Apply 2 g topically daily as needed (pain).   Yes [provider]  dicyclomine (BENTYL) 10 MG capsule TAKE ONE CAPSULE BY MOUTH EVERY MORNING, ONE AT NOON, ONE EVERY EVENING  and ONE EVERYDAY AT BEDTIME 01/05/23  Yes [name], NP  fluticasone [facility]) 50 MCG/ACT nasal spray Place 2 sprays into both nostrils in the morning. 03/23/22  Yes [provider]  hydrocortisone (ANUSOL-HC) 2.5 % rectal cream Place 1 Application rectally 2 (two) times daily. To rectum for hemorrhoid discomfort or bleeding Patient taking differently: Place 1 Application rectally 2 (two) times daily as  needed for hemorrhoids or anal itching. To rectum for hemorrhoid discomfort or bleeding 12/01/22  Yes [name], NP  hydrOXYzine (VISTARIL) 25 MG capsule Take 25 mg by mouth daily as needed for itching.   Yes [provider]  ipratropium-albuterol (DUONEB) 0.5-2.5 (3) MG/3ML SOLN Inhale 3 mLs into the lungs 2 (two) times daily. 03/07/22  Yes [provider]  ketoconazole (NIZORAL) 2 % cream Apply 1 Application topically 2 (two) times daily. To between buttocks folds for yeast. Patient taking differently: Apply 1 Application topically 2 (two) times daily as needed (skin irritation (yeast)). To between buttocks folds for yeast. 03/02/22  Yes [name], NP  levothyroxine (SYNTHROID) 50 MCG tablet Take 50 mcg by mouth daily before breakfast.   Yes [provider]  methocarbamol (ROBAXIN) 500 MG tablet Take 500 mg by mouth daily as needed for muscle spasms.   Yes [provider]  mirabegron ER (MYRBETRIQ) 25 MG TB24 tablet Take 25 mg by mouth at bedtime.   Yes [provider]  ondansetron (ZOFRAN-ODT) 4 MG disintegrating tablet Take 4 mg by mouth 2 (two) times daily as needed for nausea. 02/21/22  Yes [provider]  potassium chloride SA (KLOR-CON M) 20 MEQ tablet Take 20 mEq by mouth 2 (two) times daily. may take an additional dose in the afternoon if taking 2 tablets of furosemide.   Yes [provider]  PRESCRIPTION MEDICATION Take 40 mg by mouth daily. APTALIS   Yes [provider]  PROLIA 60 MG/ML SOSY injection Inject 60 mg into the skin every 6 (six) months. 05/14/20  Yes [provider]  simvastatin (ZOCOR) 40 MG tablet Take 40 mg by mouth at bedtime.   Yes [provider]  sodium chloride (OCEAN) 0.65 % SOLN nasal spray Place 1 spray into both nostrils as needed for congestion.   Yes [provider]  cycloSPORINE (RESTASIS) 0.05 % ophthalmic emulsion Place 1 drop into both eyes in the morning, at  noon, and at bedtime. Patient not taking: Reported on 01/04/2023    [provider]    Allergies as of 03/07/2023 - Review Complete 03/07/2023  Allergen Reaction Noted   Desyrel [trazodone] Other (See Comments) 07/14/2014   Keflex [cephalexin] Other (See Comments) 07/14/2014   Klonopin [clonazepam] Other (See Comments) 07/14/2014   Minocin [minocycline hcl] Other (See Comments) 07/14/2014   Stelazine [trifluoperazine]  07/14/2014   Viberzi [eluxadoline]  03/31/2015    Family History  Problem Relation Age of Onset   Cancer Sister        blood disorder ??   Colon cancer Neg Hx     Social History   Socioeconomic History   Marital status: Widowed    Spouse name: Not on file   Number of children: 2   Years of education: Not on file   Highest education level: Not on file  Occupational History   Occupation: disability  Tobacco Use   Smoking status: Every Day    Current packs/day: 0.25    Types: Cigarettes    Passive exposure: Current   Smokeless tobacco:  Never   Tobacco comments:    quit 03/31/18  Vaping Use   Vaping status: Never Used  Substance and Sexual Activity   Alcohol use: No    Alcohol/week: 0.0 standard drinks of alcohol   Drug use: No   Sexual activity: Not Currently  Other Topics Concern   Not on file  Social History Narrative   Not on file   Social Determinants of Health   Financial Resource Strain: Not on file  Food Insecurity: Not on file  Transportation Needs: Not on file  Physical Activity: Not on file  Stress: Not on file  Social Connections: Not on file  Intimate Partner Violence: Not on file    Review of Systems: See HPI, otherwise negative ROS  Physical Exam: BP 130/75 (BP Location: Right Arm, Patient Position: Sitting, Cuff Size: Normal)   Pulse 81   Temp 97.6 F (36.4 C) (Oral)   Ht 5\' 4"  (1.626 m)   Wt 130 lb (59 kg)   SpO2 95%   BMI 22.31 kg/m  General:   Alert,  Well-developed, well-nourished, pleasant and cooperative  in NAD Abdomen: Non-distended, normal bowel sounds.  Soft and nontender without appreciable mass or hepatosplenomegaly.   Impression/Plan: 77 year old frail lady with a history colonic adenoma bout of abdominal pain last year with abnormal colon suggestive of diverticulitis-sigmoid.  History of lymphocytic colitis.  Weight loss.  5 HIAA assay negative.  We attempted colonoscopy earlier this year but was thwarted by poor preparation. Patient has significant nausea during the prep.   Clinically, she is doing well at this time; she is gained 5 pounds.  Really not having any bowel symptoms at this time.  We will review her prior preps and come up with something to hopefully she will be able to tolerate.  Also we will plan to give her scheduled Zofran for 48 hours prior to the procedure.  He has been taking dicyclomine regularly.  Will stop that medication for the prep as well.  Recommendations:  We will reschedule a colonoscopy.  History of colonic polyps.  Abnormal colon  In the setting of diverticulitis.  ASA 3 The risks, benefits, limitations, alternatives and imponderables have been reviewed with the patient. Questions have been answered. All parties are agreeable.    Stop taking dicyclomine/Bentyl.    Take Zofran 4 mg orally disintegrating tablets-start 2 days before the procedure take 1 under the tongue every 8 hours x 2 days (total of 6 doses).  No refills.      Notice: This dictation was prepared with Dragon dictation along with smaller phrase technology. Any transcriptional errors that result from this process are unintentional and may not be corrected upon review.

## 2023-03-14 ENCOUNTER — Other Ambulatory Visit: Payer: Self-pay | Admitting: *Deleted

## 2023-03-14 ENCOUNTER — Encounter: Payer: Self-pay | Admitting: *Deleted

## 2023-03-14 MED ORDER — ONDANSETRON 4 MG PO TBDP: 4.0000 mg | ORAL_TABLET | Freq: Three times a day (TID) | ORAL | 0 refills | Status: DC

## 2023-03-16 ENCOUNTER — Other Ambulatory Visit (HOSPITAL_COMMUNITY): Payer: Self-pay | Admitting: Internal Medicine

## 2023-03-16 DIAGNOSIS — Z1231 Encounter for screening mammogram for malignant neoplasm of breast: Secondary | ICD-10-CM

## 2023-03-28 ENCOUNTER — Other Ambulatory Visit: Payer: Self-pay | Admitting: Gastroenterology

## 2023-04-02 ENCOUNTER — Other Ambulatory Visit: Payer: Self-pay | Admitting: Internal Medicine

## 2023-04-12 ENCOUNTER — Ambulatory Visit (HOSPITAL_COMMUNITY)
Admission: RE | Admit: 2023-04-12 | Discharge: 2023-04-12 | Disposition: A | Discharge status: Home / Self Care | Payer: Medicare HMO | Source: Ambulatory Visit | Attending: Family Medicine | Admitting: Family Medicine

## 2023-04-12 DIAGNOSIS — M810 Age-related osteoporosis without current pathological fracture: Secondary | ICD-10-CM | POA: Insufficient documentation

## 2023-04-13 DIAGNOSIS — R059 Cough, unspecified: Secondary | ICD-10-CM | POA: Diagnosis not present

## 2023-04-13 DIAGNOSIS — J069 Acute upper respiratory infection, unspecified: Secondary | ICD-10-CM | POA: Diagnosis not present

## 2023-04-13 DIAGNOSIS — M791 Myalgia, unspecified site: Secondary | ICD-10-CM | POA: Diagnosis not present

## 2023-04-13 DIAGNOSIS — F17210 Nicotine dependence, cigarettes, uncomplicated: Secondary | ICD-10-CM | POA: Diagnosis not present

## 2023-04-14 NOTE — Patient Instructions (Signed)
Jennifer [name]  04/14/2023     @PREFPERIOPPHARMACY @   Your procedure is scheduled on 04/19/23.  Report to [name] at 0830 A.M.  Call this number if you have problems the morning of surgery:  [phone]  If you experience any cold or flu symptoms such as cough, fever, chills, shortness of breath, etc. between now and your scheduled surgery, please notify us at the above number.   Remember:  Do not eat or drink after midnight.    Take these medicines the morning of surgery with A SIP OF WATER synthroid, hydroxyzine, robaxin & zofran if needed.  Use your albuterol & bevespi inhalers & bring albuterol inh with you.    Do not wear jewelry, make-up or nail polish, including gel polish,  artificial nails, or any other type of covering on natural nails (fingers and  toes).  Do not wear lotions, powders, or perfumes, or deodorant.  Do not shave 48 hours prior to surgery.  Men may shave face and neck.  Do not bring valuables to the hospital.  [facility] is not responsible for any belongings or valuables.  Contacts, dentures or bridgework may not be worn into surgery.  Leave your suitcase in the car.  After surgery it may be brought to your room.  For patients admitted to the hospital, discharge time will be determined by your treatment team.  Patients discharged the day of surgery will not be allowed to drive home.   Name and phone number of your driver:   family  Special instructions:  FOLLOW YOUR PREP & DIET INSTRUCTIONS PROVIDED TO YOU FROM DOCTOR'S OFFICE.  DO NOT SMOKE OR VAPE 24 HOURS PRIOR TO PROCEDURE.  Please read over the following fact sheets that you were given. Anesthesia Post-op Instructions and Care and Recovery After Surgery      PATIENT INSTRUCTIONS POST-ANESTHESIA  IMMEDIATELY FOLLOWING SURGERY:  Do not drive or operate machinery for the first twenty four hours after surgery.  Do not make any important decisions for twenty four hours after surgery or while  taking narcotic pain medications or sedatives.  If you develop intractable nausea and vomiting or a severe headache please notify your doctor immediately.  FOLLOW-UP:  Please make an appointment with your surgeon as instructed. You do not need to follow up with anesthesia unless specifically instructed to do so.  WOUND CARE INSTRUCTIONS (if applicable):  Keep a dry clean dressing on the anesthesia/puncture wound site if there is drainage.  Once the wound has quit draining you may leave it open to air.  Generally you should leave the bandage intact for twenty four hours unless there is drainage.  If the epidural site drains for more than 36-48 hours please call the anesthesia department.  QUESTIONS?:  Please feel free to call your physician or the hospital operator if you have any questions, and they will be happy to assist you.      Colonoscopy, Adult A colonoscopy is a procedure to look at the entire large intestine. This procedure is done using a long, thin, flexible tube that has a camera on the end. You may have a colonoscopy: As a part of normal colorectal screening. If you have certain symptoms, such as: A low number of red blood cells in your blood (anemia). Diarrhea that does not go away. Pain in your abdomen. Blood in your stool. A colonoscopy can help screen for and diagnose medical problems, including: An abnormal growth of cells or tissue (tumor). Abnormal growths  within the lining of your intestine (polyps). Inflammation. Areas of bleeding. Tell your health care provider about: Any allergies you have. All medicines you are taking, including vitamins, herbs, eye drops, creams, and over-the-counter medicines. Any problems you or family members have had with anesthetic medicines. Any bleeding problems you have. Any surgeries you have had. Any medical conditions you have. Any problems you have had with having bowel movements. Whether you are pregnant or may be pregnant. What  are the risks? Generally, this is a safe procedure. However, problems may occur, including: Bleeding. Damage to your intestine. Allergic reactions to medicines given during the procedure. Infection. This is rare. What happens before the procedure? Eating and drinking restrictions Follow instructions from your health care provider about eating or drinking restrictions, which may include: A few days before the procedure: Follow a low-fiber diet. Avoid nuts, seeds, dried fruit, raw fruits, and vegetables. 1-3 days before the procedure: Eat only gelatin dessert or ice pops. Drink only clear liquids, such as water, clear juice, clear broth or bouillon, black coffee or tea, or clear soft drinks or sports drinks. Avoid liquids that contain red or purple dye. The day of the procedure: Do not eat solid foods. You may continue to drink clear liquids until up to 2 hours before the procedure. Do not eat or drink anything starting 2 hours before the procedure, or within the time period that your health care provider recommends. Bowel prep If you were prescribed a bowel prep to take by mouth (orally) to clean out your colon: Take it as told by your health care provider. Starting the day before your procedure, you will need to drink a large amount of liquid medicine. The liquid will cause you to have many bowel movements of loose stool until your stool becomes almost clear or light green. If your skin or the opening between the buttocks (anus) gets irritated from diarrhea, you may relieve the irritation using: Wipes with medicine in them, such as adult wet wipes with aloe and vitamin E. A product to soothe skin, such as petroleum jelly. If you vomit while drinking the bowel prep: Take a break for up to 60 minutes. Begin the bowel prep again. Call your health care provider if you keep vomiting or you cannot take the bowel prep without vomiting. To clean out your colon, you may also be given: Laxative  medicines. These help you have a bowel movement. Instructions for enema use. An enema is liquid medicine injected into your rectum. Medicines Ask your health care provider about: Changing or stopping your regular medicines or supplements. This is especially important if you are taking iron supplements, diabetes medicines, or blood thinners. Taking medicines such as aspirin and ibuprofen. These medicines can thin your blood. Do not take these medicines unless your health care provider tells you to take them. Taking over-the-counter medicines, vitamins, herbs, and supplements. General instructions Ask your health care provider what steps will be taken to help prevent infection. These may include washing skin with a germ-killing soap. If you will be going home right after the procedure, plan to have a responsible adult: Take you home from the hospital or clinic. You will not be allowed to drive. Care for you for the time you are told. What happens during the procedure?  An IV will be inserted into one of your veins. You will be given a medicine to make you fall asleep (general anesthetic). You will lie on your side with your knees bent. A lubricant  will be put on the tube. Then the tube will be: Inserted into your anus. Gently eased through all parts of your large intestine. Air will be sent into your colon to keep it open. This may cause some pressure or cramping. Images will be taken with the camera and will appear on a screen. A small tissue sample may be removed to be looked at under a microscope (biopsy). The tissue may be sent to a lab for testing if any signs of problems are found. If small polyps are found, they may be removed and checked for cancer cells. When the procedure is finished, the tube will be removed. The procedure may vary among health care providers and hospitals. What happens after the procedure? Your blood pressure, heart rate, breathing rate, and blood oxygen level  will be monitored until you leave the hospital or clinic. You may have a small amount of blood in your stool. You may pass gas and have mild cramping or bloating in your abdomen. This is caused by the air that was used to open your colon during the exam. If you were given a sedative during the procedure, it can affect you for several hours. Do not drive or operate machinery until your health care provider says that it is safe. It is up to you to get the results of your procedure. Ask your health care provider, or the department that is doing the procedure, when your results will be ready. Summary A colonoscopy is a procedure to look at the entire large intestine. Follow instructions from your health care provider about eating and drinking before the procedure. If you were prescribed an oral bowel prep to clean out your colon, take it as told by your health care provider. During the colonoscopy, a flexible tube with a camera on its end is inserted into the anus and then passed into all parts of the large intestine. This information is not intended to replace advice given to you by your health care provider. Make sure you discuss any questions you have with your health care provider. Document Revised: 08/30/2022 Document Reviewed: 03/10/2021 Elsevier Patient Education  2024 [employer] Colonoscopy, Adult, Care After The following information offers guidance on how to care for yourself after your procedure. Your health care provider may also give you more specific instructions. If you have problems or questions, contact your health care provider. What can I expect after the procedure? After the procedure, it is common to have: A small amount of blood in your stool for 24 hours after the procedure. Some gas. Mild cramping or bloating of your abdomen. Follow these instructions at home: Eating and drinking  Drink enough fluid to keep your urine pale yellow. Follow instructions from your health  care provider about eating or drinking restrictions. Resume your normal diet as told by your health care provider. Avoid heavy or fried foods that are hard to digest. Activity Rest as told by your health care provider. Avoid sitting for a long time without moving. Get up to take short walks every 1-2 hours. This is important to improve blood flow and breathing. Ask for help if you feel weak or unsteady. Return to your normal activities as told by your health care provider. Ask your health care provider what activities are safe for you. Managing cramping and bloating  Try walking around when you have cramps or feel bloated. If directed, apply heat to your abdomen as told by your health care provider. Use the heat source that  your health care provider recommends, such as a moist heat pack or a heating pad. Place a towel between your skin and the heat source. Leave the heat on for 20-30 minutes. Remove the heat if your skin turns bright red. This is especially important if you are unable to feel pain, heat, or cold. You have a greater risk of getting burned. General instructions If you were given a sedative during the procedure, it can affect you for several hours. Do not drive or operate machinery until your health care provider says that it is safe. For the first 24 hours after the procedure: Do not sign important documents. Do not drink alcohol. Do your regular daily activities at a slower pace than normal. Eat soft foods that are easy to digest. Take over-the-counter and prescription medicines only as told by your health care provider. Keep all follow-up visits. This is important. Contact a health care provider if: You have blood in your stool 2-3 days after the procedure. Get help right away if: You have more than a small spotting of blood in your stool. You have large blood clots in your stool. You have swelling of your abdomen. You have nausea or vomiting. You have a fever. You have  increasing pain in your abdomen that is not relieved with medicine. These symptoms may be an emergency. Get help right away. Call 911. Do not wait to see if the symptoms will go away. Do not drive yourself to the hospital. Summary After the procedure, it is common to have a small amount of blood in your stool. You may also have mild cramping and bloating of your abdomen. If you were given a sedative during the procedure, it can affect you for several hours. Do not drive or operate machinery until your health care provider says that it is safe. Get help right away if you have a lot of blood in your stool, nausea or vomiting, a fever, or increased pain in your abdomen. This information is not intended to replace advice given to you by your health care provider. Make sure you discuss any questions you have with your health care provider. Document Revised: 08/30/2022 Document Reviewed: 03/10/2021 Elsevier Patient Education  2024 [employer].

## 2023-04-17 ENCOUNTER — Encounter (HOSPITAL_COMMUNITY)
Admission: RE | Admit: 2023-04-17 | Discharge: 2023-04-17 | Disposition: A | Discharge status: Home / Self Care | Payer: Medicare HMO | Source: Ambulatory Visit | Attending: Internal Medicine | Admitting: Internal Medicine

## 2023-04-17 ENCOUNTER — Encounter (HOSPITAL_COMMUNITY): Payer: Self-pay

## 2023-04-17 VITALS — BP 106/60 | HR 83 | Temp 97.8°F | Resp 18 | Ht 64.0 in | Wt 130.1 lb

## 2023-04-17 DIAGNOSIS — Z86010 Personal history of colonic polyps: Secondary | ICD-10-CM | POA: Diagnosis not present

## 2023-04-17 DIAGNOSIS — Z01812 Encounter for preprocedural laboratory examination: Secondary | ICD-10-CM | POA: Insufficient documentation

## 2023-04-17 LAB — COMPREHENSIVE METABOLIC PANEL
ALT: 12 U/L (ref 0–44)
AST: 19 U/L (ref 15–41)
Albumin: 3.3 g/dL — ABNORMAL LOW (ref 3.5–5.0)
Alkaline Phosphatase: 49 U/L (ref 38–126)
Anion gap: 8 (ref 5–15)
BUN: 6 mg/dL — ABNORMAL LOW (ref 8–23)
CO2: 30 mmol/L (ref 22–32)
Calcium: 8.8 mg/dL — ABNORMAL LOW (ref 8.9–10.3)
Chloride: 99 mmol/L (ref 98–111)
Creatinine, Ser: 0.77 mg/dL (ref 0.44–1.00)
GFR, Estimated: >60 mL/min (ref >60)
Glucose, Bld: 90 mg/dL (ref 70–99)
Potassium: 3.8 mmol/L (ref 3.5–5.1)
Sodium: 137 mmol/L (ref 135–145)
Total Bilirubin: 0.5 mg/dL (ref 0.3–1.2)
Total Protein: 6 g/dL — ABNORMAL LOW (ref 6.5–8.1)

## 2023-04-19 ENCOUNTER — Encounter (HOSPITAL_COMMUNITY): Payer: Self-pay | Admitting: Anesthesiology

## 2023-04-19 ENCOUNTER — Telehealth: Payer: Self-pay | Admitting: *Deleted

## 2023-04-19 ENCOUNTER — Encounter (HOSPITAL_COMMUNITY): Admission: RE | Payer: Self-pay | Source: Home / Self Care

## 2023-04-19 ENCOUNTER — Ambulatory Visit (HOSPITAL_COMMUNITY): Admission: RE | Admit: 2023-04-19 | Payer: Medicare HMO | Source: Home / Self Care | Admitting: Internal Medicine

## 2023-04-19 SURGERY — COLONOSCOPY WITH PROPOFOL
Anesthesia: Monitor Anesthesia Care

## 2023-04-19 NOTE — Telephone Encounter (Signed)
Please schedule per Dr. Jena Gauss

## 2023-04-19 NOTE — Telephone Encounter (Signed)
Received message from endo that patient did not show up for procedure today although she did show for her pre-op appointment.  Called pt, she stated she took her 2 dulcolax and started feeling sick to her stomach last night. I asked if she tried to call us or why she didn't call us this morning to let us know she wasn't going to show up. She stated she was probably asleep. Patient also no showed for her procedure back in June and did not call us at that time either.   Dr. Jena Gauss, do you want [place] to reschedule or bring in for OV prior?

## 2023-04-19 NOTE — Progress Notes (Signed)
Patient did not show up for her colonoscopy. Several failed attempts were made to contact her.

## 2023-04-20 ENCOUNTER — Encounter: Payer: Self-pay | Admitting: Gastroenterology

## 2023-04-20 ENCOUNTER — Other Ambulatory Visit: Payer: Self-pay | Admitting: Gastroenterology

## 2023-04-20 ENCOUNTER — Ambulatory Visit (INDEPENDENT_AMBULATORY_CARE_PROVIDER_SITE_OTHER): Payer: Medicare HMO | Admitting: Gastroenterology

## 2023-04-20 VITALS — BP 102/64 | HR 82 | Temp 98.0°F | Ht 64.0 in | Wt 131.7 lb

## 2023-04-20 DIAGNOSIS — K56699 Other intestinal obstruction unspecified as to partial versus complete obstruction: Secondary | ICD-10-CM | POA: Diagnosis not present

## 2023-04-20 DIAGNOSIS — Z8719 Personal history of other diseases of the digestive system: Secondary | ICD-10-CM

## 2023-04-20 DIAGNOSIS — R1310 Dysphagia, unspecified: Secondary | ICD-10-CM

## 2023-04-20 NOTE — Patient Instructions (Signed)
I would like to get the CT scan first.  I am talking with Dr. Jena Gauss about the best next steps for the prep.  I do believe you need an upper endoscopy at time of the procedure as well!  Further recommendations to follow!  I enjoyed seeing you again today! I value our relationship and want to provide genuine, compassionate, and quality care. You may receive a survey regarding your visit with me, and I welcome your feedback! Thanks so much for taking the time to complete this. I look forward to seeing you again.      [name], PhD, ANP-BC [facility]

## 2023-04-20 NOTE — Progress Notes (Addendum)
Gastroenterology Office Note     Primary Care Physician:  Benita Stabile, MD  Primary Gastroenterologist: Dr. Jena Gauss    Chief Complaint   Chief Complaint  Patient presents with   Colon Cancer Screening    Patient states she has tried to get colonoscopy scheduled but unable to get prep done for procedure due to it making her sick. Tried zofran with prep and it did not help.      History of Present Illness   Jennifer BORCHARD is a 77 y.o. female presenting today with a history of  chronic diarrhea, distant history of lymphocytic colitis, negative celiac evaluation, last colonoscopy 2020 with tubular adenomas, diverticulitis in Aug 2023, and weight loss. She is here to arrange early interval colonoscopy due to history of diverticulitis. She was unable to complete prep recently and had worsening cramping, fatigue, felt wiped out.  This was her second attempt at prep, as she notes severe nausea with bowel prep. She is here to discuss colonoscopy.    Regarding diarrhea: Fecal elastase normal. Negative small bowel biopsies.dicyclomine has been helpful for abdominal cramping. She had been on entocort 9 mg without improvement. Off label pepto without significant improvement in the past. Diarrhea felt multifactorial in light of history and possible bile salt diarrhea contributing. Negative celiac serologies in past. Normal 24 hour 5HIAA. She has good and bad days, but she consistently has diarrhea.       Abnormal CT 03/01/2022 with asymmetric wall thickening in sigmoid colon, minimal pericolic stranding adjacent to the sigmoid colon, query diverticulitis or neoplastic process. Liver cysts without change from prior. Treated with abx. Needs early interval colonoscopy.    Having more constipation lately. Had done really well on dicyclomine QID. Took 2 dulcolax prior to prep and had lower abdominal cramping. Didn't take Miralax. Had large stool then lots of diarrhea and felt fatigued and wiped out. Had  stopped dicyclomine prior to prep. When taking dicyclomine, will have one BM a day at least.   Will sometimes feel nauseated after drinking water and throws up. Makes her feel sick. Feels nauseated with food. Taking Zofran. Noting solid food dysphagia. No GERD. Fatigued, feels lots of joint and muscle pain everywhere. Vomiting with prep.    Last EGD Oct 2022 normal.   Past Medical History:  Diagnosis Date   Complication of anesthesia    difficulty waking up after hysterectomy   COPD (chronic obstructive pulmonary disease) (HCC)    Fatty liver    GERD (gastroesophageal reflux disease)    History of colon polyps    remote past, unable to retrieve records    Hyperlipidemia    Hypertension    Hypothyroidism    Lymphocytic colitis    Rheumatoid arthritis (HCC)     Past Surgical History:  Procedure Laterality Date   cataract  surgery Bilateral    COLONOSCOPY     last one was about 3 yrs in [facility]   COLONOSCOPY N/A 02/19/2016   Dr. Jena Gauss: three 3-6 mm polyps (tubular adenomas) in ascending colon, segmental biopsies consistent with lymphocytic colitis. Prescribed entocort.    COLONOSCOPY N/A 03/06/2019   Dr. Jena Gauss: 2 polyps removed, tubular adenomas, diverticulosis.  NO random colon biopsies performed.  Next colonoscopy 5 years.   ESOPHAGOGASTRODUODENOSCOPY N/A 08/11/2014   RMR: MIld erosive  reflux esophagitis. small hiatal hernia. Abnormal gastric muocsa uncertain significance. Subtly abnormal duodeanl (bulbar) mucosa- status post multiple biopsies. BENIGN small bowel mucosa, no evidence of villous blulnting,  mild reactive gastropathy on stomach biopsy    ESOPHAGOGASTRODUODENOSCOPY (EGD) WITH PROPOFOL N/A 05/31/2021   normal   GALLBLADDER SURGERY     LEG SURGERY     right   MALONEY DILATION N/A 05/31/2021   Procedure: [name] DILATION;  Surgeon: Corbin Ade, MD;  Location: [facility];  Service: Endoscopy;  Laterality: N/A;   PARTIAL HYSTERECTOMY      Current  Outpatient Medications  Medication Sig Dispense Refill   acetaminophen (TYLENOL) 650 MG CR tablet Take 650 mg by mouth every 8 (eight) hours as needed for pain.     albuterol (VENTOLIN HFA) 108 (90 Base) MCG/ACT inhaler Inhale 1-2 puffs into the lungs every 6 (six) hours as needed for shortness of breath or wheezing.     bismuth subsalicylate (STOMACH RELIEF) 262 MG chewable tablet chew 3 TABLETS BY MOUTH THREE TIMES DAILY 270 tablet 1   Calcium Carb-Cholecalciferol (CALCIUM 600+D3 PO) Take 2 tablets by mouth in the morning.     Camphor-Menthol-Methyl Sal (SALONPAS) 3.08-06-08 % PTCH Apply 1 Application topically daily as needed (pain).     cycloSPORINE (RESTASIS) 0.05 % ophthalmic emulsion Place 1 drop into both eyes in the morning, at noon, and at bedtime.     diclofenac Sodium (VOLTAREN) 1 % GEL Apply 2 g topically daily as needed (pain).     dicyclomine (BENTYL) 10 MG capsule TAKE ONE CAPSULE BY MOUTH EVERY MORNING, ONE AT NOON, ONE EVERY EVENING and ONE EVERYDAY AT BEDTIME 240 capsule 3   fluticasone (FLONASE) 50 MCG/ACT nasal spray Place 2 sprays into both nostrils in the morning.     HYDROcodone bit-homatropine (HYDROMET) 5-1.5 MG/5ML syrup Take 5 mL every 4-6 hours by oral route as needed.     hydrocortisone (ANUSOL-HC) 2.5 % rectal cream Place 1 Application rectally 2 (two) times daily. To rectum for hemorrhoid discomfort or bleeding (Patient taking differently: Place 1 Application rectally 2 (two) times daily as needed for hemorrhoids or anal itching. To rectum for hemorrhoid discomfort or bleeding) 30 g 1   hydrOXYzine (VISTARIL) 25 MG capsule Take 25 mg by mouth daily as needed for itching.     ipratropium-albuterol (DUONEB) 0.5-2.5 (3) MG/3ML SOLN Inhale 3 mLs into the lungs 2 (two) times daily.     ketoconazole (NIZORAL) 2 % cream APPLY 1 APPLICATION TOPICALLY TWICE DAILY BETWEEN BUTTOCKS FOLDS FOR YEAST *REFILL REQUEST* 60 g 0   levothyroxine (SYNTHROID) 50 MCG tablet Take 50 mcg by  mouth daily before breakfast.     methocarbamol (ROBAXIN) 500 MG tablet Take 500 mg by mouth daily as needed for muscle spasms.     mirabegron ER (MYRBETRIQ) 25 MG TB24 tablet Take 25 mg by mouth at bedtime.     potassium chloride SA (KLOR-CON M) 20 MEQ tablet Take 20 mEq by mouth 2 (two) times daily. may take an additional dose in the afternoon if taking 2 tablets of furosemide.     predniSONE (DELTASONE) 5 MG tablet Take 1 dose pk by oral route.     PROLIA 60 MG/ML SOSY injection Inject 60 mg into the skin every 6 (six) months.     simvastatin (ZOCOR) 40 MG tablet Take 40 mg by mouth at bedtime.     sodium chloride (OCEAN) 0.65 % SOLN nasal spray Place 1 spray into both nostrils as needed for congestion.     PRESCRIPTION MEDICATION Take 40 mg by mouth daily. APTALIS (Patient not taking: Reported on 04/20/2023)     No current facility-administered medications  for this visit.    Allergies as of 04/20/2023 - Review Complete 04/20/2023  Allergen Reaction Noted   Desyrel [trazodone] Other (See Comments) 07/14/2014   Keflex [cephalexin] Other (See Comments) 07/14/2014   Klonopin [clonazepam] Other (See Comments) 07/14/2014   Minocin [minocycline hcl] Other (See Comments) 07/14/2014   Stelazine [trifluoperazine]  07/14/2014   Viberzi [eluxadoline]  03/31/2015    Family History  Problem Relation Age of Onset   Cancer Sister        blood disorder ??   Colon cancer Neg Hx     Social History   Socioeconomic History   Marital status: Widowed    Spouse name: Not on file   Number of children: 2   Years of education: Not on file   Highest education level: Not on file  Occupational History   Occupation: disability  Tobacco Use   Smoking status: Every Day    Current packs/day: 0.25    Types: Cigarettes    Passive exposure: Current   Smokeless tobacco: Never   Tobacco comments:    quit 03/31/18  Vaping Use   Vaping status: Never Used  Substance and Sexual Activity   Alcohol use: No     Alcohol/week: 0.0 standard drinks of alcohol   Drug use: No   Sexual activity: Not Currently  Other Topics Concern   Not on file  Social History Narrative   Not on file   Social Determinants of Health   Financial Resource Strain: Not on file  Food Insecurity: Not on file  Transportation Needs: Not on file  Physical Activity: Not on file  Stress: Not on file  Social Connections: Not on file  Intimate Partner Violence: Not on file     Review of Systems   Gen: Denies any fever, chills, fatigue, weight loss, lack of appetite.  CV: Denies chest pain, heart palpitations, peripheral edema, syncope.  Resp: Denies shortness of breath at rest or with exertion. Denies wheezing or cough.  GI: Denies dysphagia or odynophagia. Denies jaundice, hematemesis, fecal incontinence. GU : Denies urinary burning, urinary frequency, urinary hesitancy MS: Denies joint pain, muscle weakness, cramps, or limitation of movement.  Derm: Denies rash, itching, dry skin Psych: Denies depression, anxiety, memory loss, and confusion Heme: Denies bruising, bleeding, and enlarged lymph nodes.   Physical Exam   BP 102/64 (BP Location: Left Arm, Patient Position: Sitting, Cuff Size: Normal)   Pulse 82   Temp 98 F (36.7 C) (Oral)   Ht 5\' 4"  (1.626 m)   Wt 131 lb 11.2 oz (59.7 kg)   BMI 22.61 kg/m  General:   Alert and oriented. Pleasant and cooperative. Well-nourished and well-developed.  Head:  Normocephalic and atraumatic. Eyes:  Without icterus Abdomen:  +BS, soft, non-tender and non-distended. No HSM noted. No guarding or rebound. No masses appreciated.  Rectal:  external hemorrhoids, no mass on DRE, ?rectocele Msk:  Symmetrical without gross deformities. Normal posture. Extremities:  Without edema. Neurologic:  Alert and  oriented x4;  grossly normal neurologically. Skin:  Intact without significant lesions or rashes. Psych:  Alert and cooperative. Normal mood and affect.   Assessment    LATRINIA [name] is a 77 y.o. female presenting today with a history of chronic diarrhea, distant history of lymphocytic colitis, negative celiac evaluation, last colonoscopy 2020 with tubular adenomas, diverticulitis in Aug 2023, and weight loss. She is here to arrange early interval colonoscopy due to history of diverticulitis. Please see prior eval in HPI  Unfortunately, she has  failed to prep on 2 occasions recently with significant nausea, abdominal pain, feeling fatigued. Dicyclomine has been helpful recently in controlling diarrhea; she does confirm she held this prior to starting the last prep but still had issues.  CT Aug 2023 with asymmetric wall thickening in sigmoid colon, query diverticulitis but unable to rule out neoplasm. At this point, she does need early interval colonoscopy; however, she feels she will be unable to take in oral prep or pills (Pills were last attempt).   Due to her constellation of symptoms, I am updating CT scan. We could consider flex sig to evaluate area, but this would preclude evaluation of entire colon. Will await CT findings.  She also notes postprandial nausea and at times vomits after drinking water; solid food dysphagia also noted. Last EGD in 2022 normal. Can add EGD/dilation as well.      PLAN    CT abd/pelvis with contrast Consider flex sig only, with EGD/dilation vs colonoscopy Continue dicyclomine Further recommendations to follow    [name], PhD, ANP-BC [facility]    ADDENDUM: CT updated. No diverticulitis or obvious mass appreciated. Discussed with Dr. Jena Gauss. We will address upper GI symptoms first and then can circle back on colonoscopy once this is improved. Will arrange EGD/dilation with Dr. Jena Gauss due to history of N/V, dysphagia.

## 2023-04-24 DIAGNOSIS — J449 Chronic obstructive pulmonary disease, unspecified: Secondary | ICD-10-CM | POA: Diagnosis not present

## 2023-04-24 DIAGNOSIS — Z515 Encounter for palliative care: Secondary | ICD-10-CM | POA: Diagnosis not present

## 2023-04-25 ENCOUNTER — Ambulatory Visit (HOSPITAL_BASED_OUTPATIENT_CLINIC_OR_DEPARTMENT_OTHER): Payer: Medicare HMO

## 2023-04-25 ENCOUNTER — Ambulatory Visit (HOSPITAL_BASED_OUTPATIENT_CLINIC_OR_DEPARTMENT_OTHER)
Admission: RE | Admit: 2023-04-25 | Discharge: 2023-04-25 | Disposition: A | Discharge status: Home / Self Care | Payer: Medicare HMO | Source: Ambulatory Visit | Attending: Gastroenterology | Admitting: Gastroenterology

## 2023-04-25 DIAGNOSIS — K7689 Other specified diseases of liver: Secondary | ICD-10-CM | POA: Diagnosis not present

## 2023-04-25 DIAGNOSIS — K5730 Diverticulosis of large intestine without perforation or abscess without bleeding: Secondary | ICD-10-CM | POA: Diagnosis not present

## 2023-04-25 DIAGNOSIS — K838 Other specified diseases of biliary tract: Secondary | ICD-10-CM | POA: Diagnosis not present

## 2023-04-25 DIAGNOSIS — K56699 Other intestinal obstruction unspecified as to partial versus complete obstruction: Secondary | ICD-10-CM | POA: Insufficient documentation

## 2023-04-25 DIAGNOSIS — R102 Pelvic and perineal pain: Secondary | ICD-10-CM | POA: Diagnosis not present

## 2023-04-25 MED ORDER — IOHEXOL 300 MG/ML  SOLN
100.0000 mL | Freq: Once | INTRAMUSCULAR | Status: AC | PRN
  Administered 2023-04-25: 75 mL via INTRAVENOUS

## 2023-04-28 ENCOUNTER — Inpatient Hospital Stay (HOSPITAL_COMMUNITY): Admission: RE | Admit: 2023-04-28 | Payer: Medicare HMO | Source: Ambulatory Visit

## 2023-05-01 DIAGNOSIS — M779 Enthesopathy, unspecified: Secondary | ICD-10-CM | POA: Diagnosis not present

## 2023-05-01 DIAGNOSIS — M79672 Pain in left foot: Secondary | ICD-10-CM | POA: Diagnosis not present

## 2023-05-01 DIAGNOSIS — L565 Disseminated superficial actinic porokeratosis (DSAP): Secondary | ICD-10-CM | POA: Diagnosis not present

## 2023-05-01 DIAGNOSIS — M79671 Pain in right foot: Secondary | ICD-10-CM | POA: Diagnosis not present

## 2023-05-01 DIAGNOSIS — M79674 Pain in right toe(s): Secondary | ICD-10-CM | POA: Diagnosis not present

## 2023-05-01 DIAGNOSIS — L110 Acquired keratosis follicularis: Secondary | ICD-10-CM | POA: Diagnosis not present

## 2023-05-01 DIAGNOSIS — M79675 Pain in left toe(s): Secondary | ICD-10-CM | POA: Diagnosis not present

## 2023-05-03 ENCOUNTER — Ambulatory Visit (HOSPITAL_COMMUNITY)
Admission: RE | Admit: 2023-05-03 | Discharge: 2023-05-03 | Disposition: A | Discharge status: Home / Self Care | Payer: Medicare HMO | Source: Ambulatory Visit | Attending: Internal Medicine | Admitting: Internal Medicine

## 2023-05-03 ENCOUNTER — Encounter (HOSPITAL_COMMUNITY): Payer: Self-pay

## 2023-05-03 DIAGNOSIS — Z1231 Encounter for screening mammogram for malignant neoplasm of breast: Secondary | ICD-10-CM | POA: Diagnosis not present

## 2023-05-11 IMAGING — MG MM DIGITAL SCREENING BILAT W/ TOMO AND CAD
6 of 10 series · 6 of 30 positions shown · non-contrast
Comparison: Previous exam(s).

CLINICAL DATA: Screening.

EXAM:
DIGITAL SCREENING BILATERAL MAMMOGRAM WITH TOMOSYNTHESIS AND CAD
TECHNIQUE: Bilateral screening digital craniocaudal and mediolateral oblique
mammograms were obtained. Bilateral screening digital breast
tomosynthesis was performed. The images were evaluated with
computer-aided detection.

[L CC synth-2D (1 of 2)]
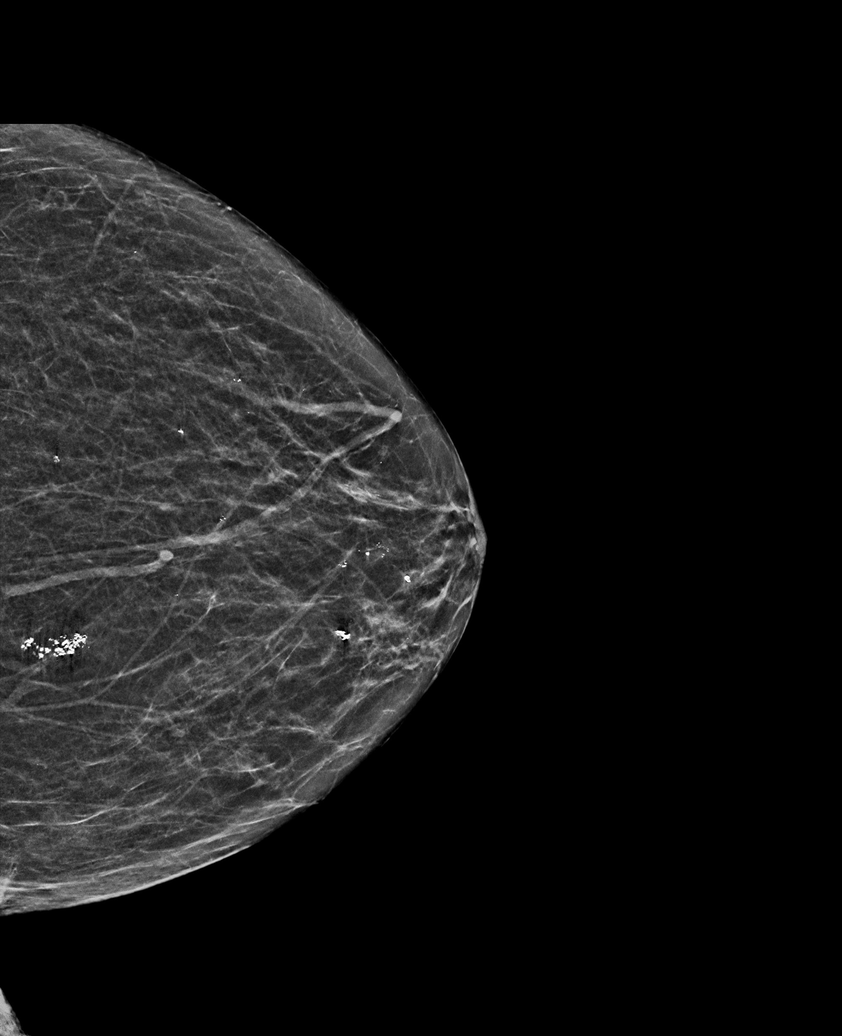

[L CC synth-2D (2 of 2)]
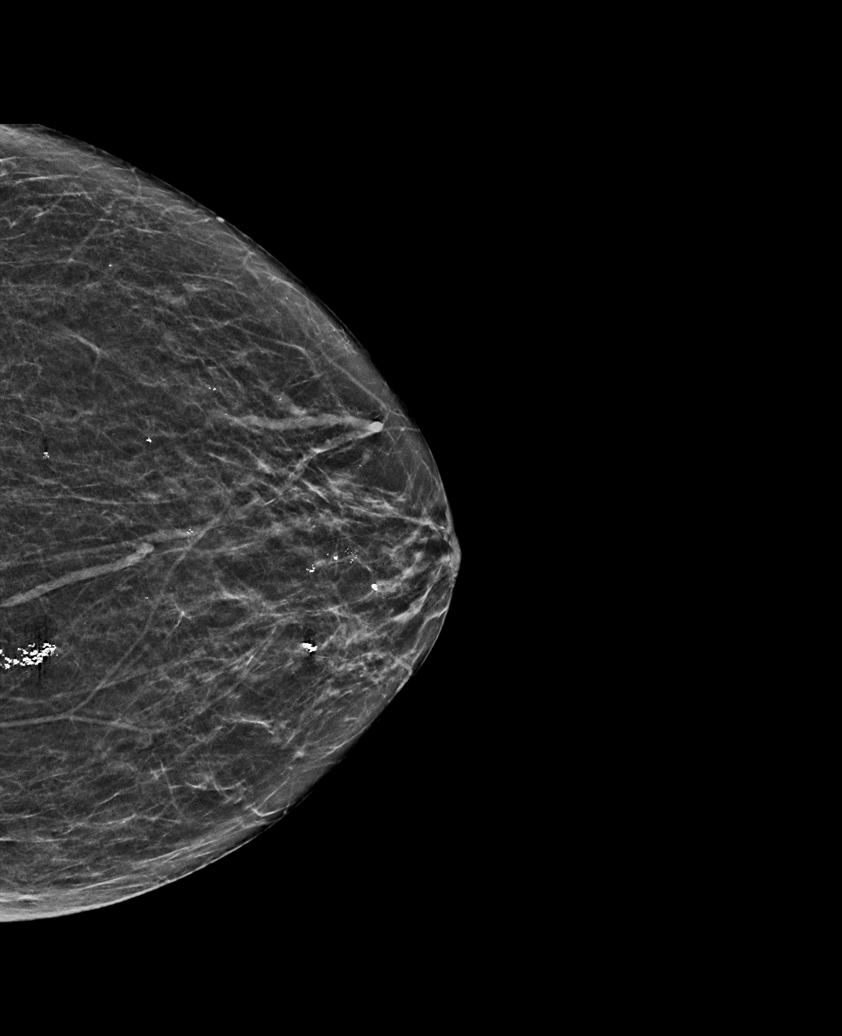

[L MLO synth-2D]
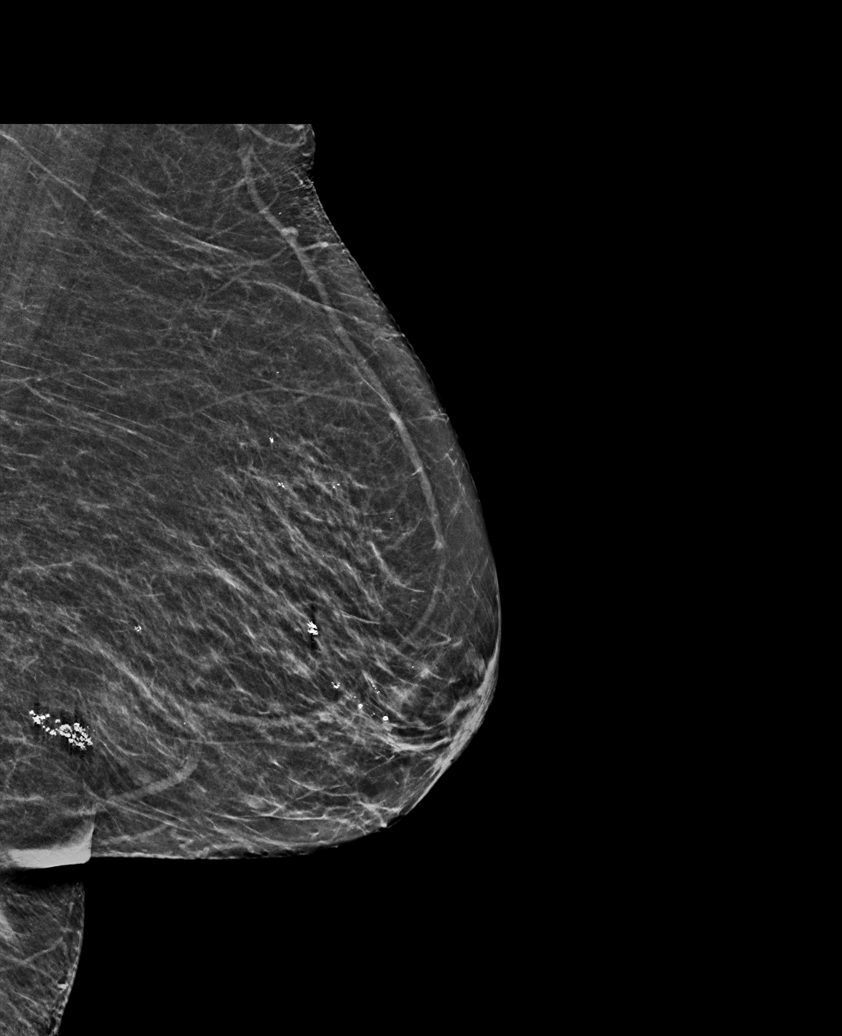

[R MLO synth-2D]
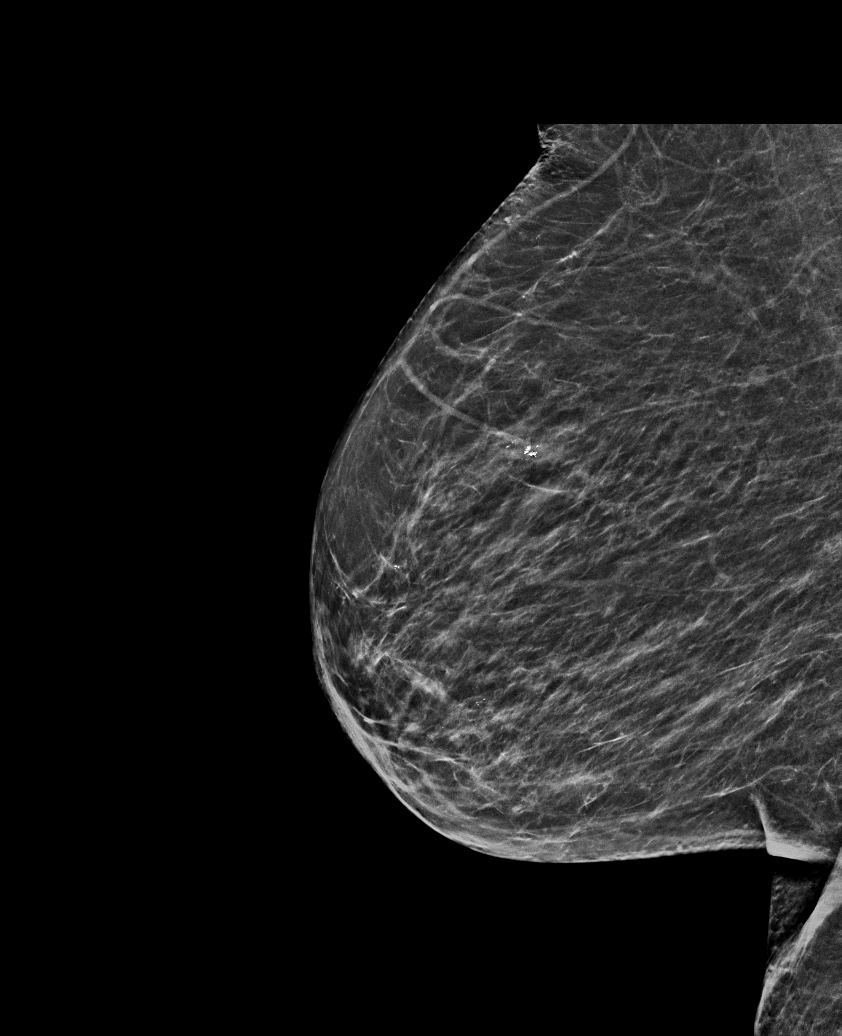

[R CC synth-2D]
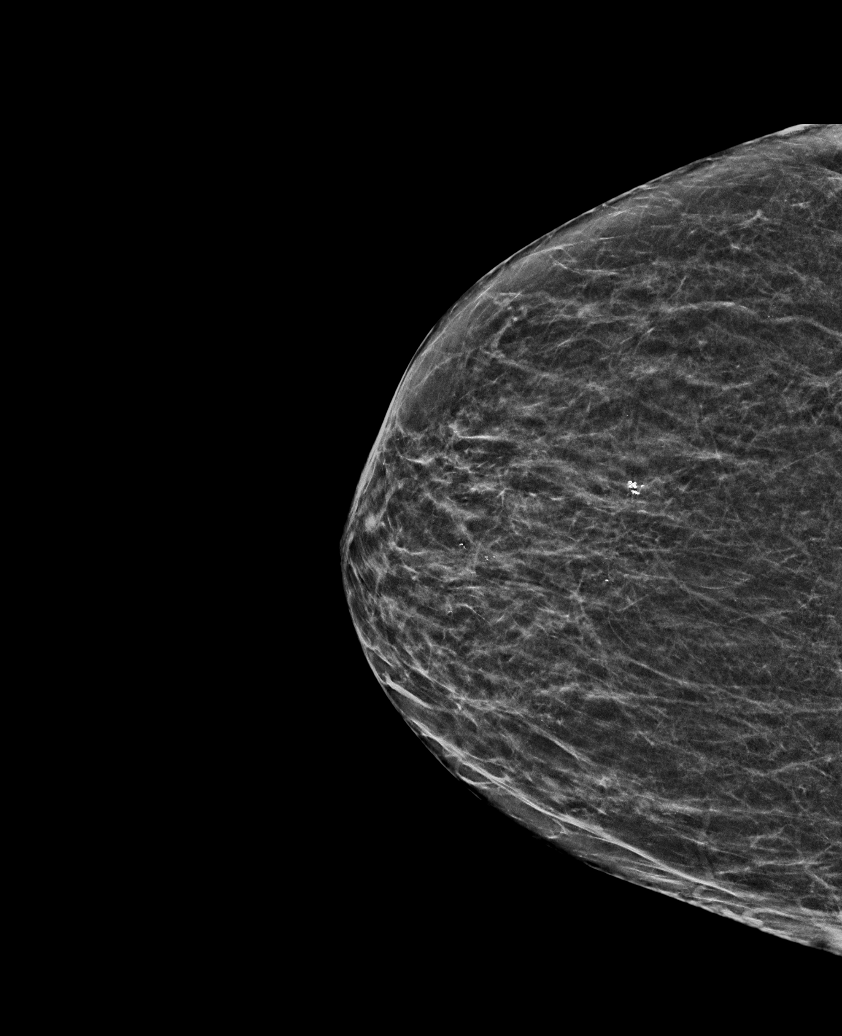

[L CC tomo · tomo slice 23/46.0]
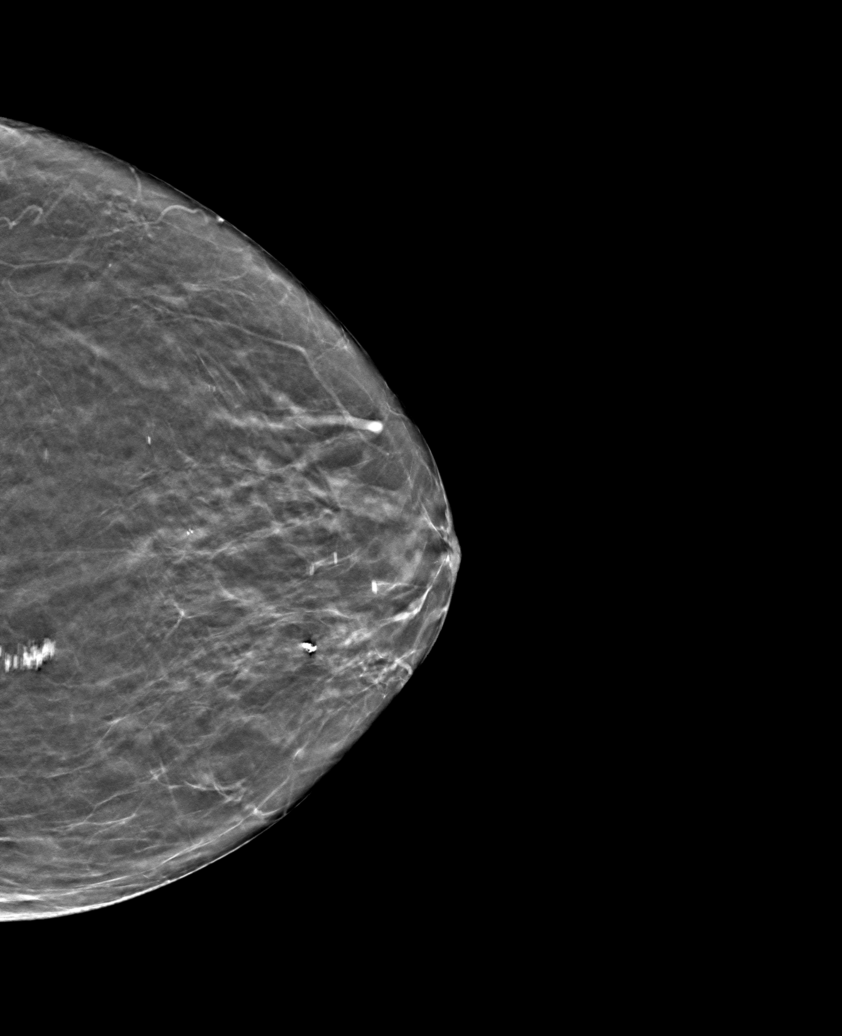

[6 of 30 positions shown; findings below may reference images not displayed]

ACR Breast Density Category b: There are scattered areas of
fibroglandular density.
FINDINGS: There are no findings suspicious for malignancy.
IMPRESSION: No mammographic evidence of malignancy. A result letter of this
screening mammogram will be mailed directly to the patient.

RECOMMENDATION:
Screening mammogram in one year. (Code:51-O-LD2)

BI-RADS CATEGORY  1: Negative.

## 2023-06-09 DIAGNOSIS — Z515 Encounter for palliative care: Secondary | ICD-10-CM | POA: Diagnosis not present

## 2023-06-09 DIAGNOSIS — J449 Chronic obstructive pulmonary disease, unspecified: Secondary | ICD-10-CM | POA: Diagnosis not present

## 2023-06-13 DIAGNOSIS — B370 Candidal stomatitis: Secondary | ICD-10-CM | POA: Diagnosis not present

## 2023-06-18 ENCOUNTER — Telehealth: Payer: Self-pay | Admitting: Gastroenterology

## 2023-06-18 NOTE — Telephone Encounter (Signed)
Mindy/Tammy:  Please let patient know I reviewed her case with Dr. Jena Gauss. We will pursue EGD/ED now. ASA 3. Dx: N/V, dysphagia.   Please arrange appt with me thereafter. Thanks!

## 2023-06-19 NOTE — Telephone Encounter (Signed)
Pt informed of providers recommendation. Verbalized understanding. Informed pt that once we get providers schedule for January, will give her a call to get her scheduled.

## 2023-07-03 ENCOUNTER — Encounter: Payer: Self-pay | Admitting: *Deleted

## 2023-07-04 ENCOUNTER — Encounter: Payer: Self-pay | Admitting: *Deleted

## 2023-07-05 ENCOUNTER — Other Ambulatory Visit: Payer: Self-pay

## 2023-07-05 ENCOUNTER — Encounter (HOSPITAL_COMMUNITY): Payer: Self-pay

## 2023-07-05 ENCOUNTER — Encounter (HOSPITAL_COMMUNITY)
Admission: RE | Admit: 2023-07-05 | Discharge: 2023-07-05 | Disposition: A | Discharge status: Home / Self Care | Payer: Medicare HMO | Source: Ambulatory Visit | Attending: Internal Medicine | Admitting: Internal Medicine

## 2023-07-06 DIAGNOSIS — R7303 Prediabetes: Secondary | ICD-10-CM | POA: Diagnosis not present

## 2023-07-06 DIAGNOSIS — K529 Noninfective gastroenteritis and colitis, unspecified: Secondary | ICD-10-CM | POA: Diagnosis not present

## 2023-07-06 DIAGNOSIS — R0609 Other forms of dyspnea: Secondary | ICD-10-CM | POA: Diagnosis not present

## 2023-07-06 DIAGNOSIS — G8929 Other chronic pain: Secondary | ICD-10-CM | POA: Diagnosis not present

## 2023-07-06 DIAGNOSIS — J449 Chronic obstructive pulmonary disease, unspecified: Secondary | ICD-10-CM | POA: Diagnosis not present

## 2023-07-06 DIAGNOSIS — N3281 Overactive bladder: Secondary | ICD-10-CM | POA: Diagnosis not present

## 2023-07-06 DIAGNOSIS — F17210 Nicotine dependence, cigarettes, uncomplicated: Secondary | ICD-10-CM | POA: Diagnosis not present

## 2023-07-06 DIAGNOSIS — M545 Low back pain, unspecified: Secondary | ICD-10-CM | POA: Diagnosis not present

## 2023-07-06 DIAGNOSIS — R600 Localized edema: Secondary | ICD-10-CM | POA: Diagnosis not present

## 2023-07-06 DIAGNOSIS — E039 Hypothyroidism, unspecified: Secondary | ICD-10-CM | POA: Diagnosis not present

## 2023-07-06 DIAGNOSIS — E782 Mixed hyperlipidemia: Secondary | ICD-10-CM | POA: Diagnosis not present

## 2023-07-06 DIAGNOSIS — M810 Age-related osteoporosis without current pathological fracture: Secondary | ICD-10-CM | POA: Diagnosis not present

## 2023-07-07 ENCOUNTER — Ambulatory Visit (HOSPITAL_COMMUNITY): Payer: Medicare HMO | Admitting: Certified Registered Nurse Anesthetist

## 2023-07-07 ENCOUNTER — Encounter (HOSPITAL_COMMUNITY)
Admission: RE | Disposition: A | Discharge status: Home / Self Care | Payer: Self-pay | Source: Home / Self Care | Attending: Internal Medicine

## 2023-07-07 ENCOUNTER — Ambulatory Visit (HOSPITAL_COMMUNITY)
Admission: RE | Admit: 2023-07-07 | Discharge: 2023-07-07 | Disposition: A | Discharge status: Home / Self Care | Payer: Medicare HMO | Attending: Internal Medicine | Admitting: Internal Medicine

## 2023-07-07 ENCOUNTER — Encounter (HOSPITAL_COMMUNITY): Payer: Self-pay | Admitting: Internal Medicine

## 2023-07-07 DIAGNOSIS — K449 Diaphragmatic hernia without obstruction or gangrene: Secondary | ICD-10-CM

## 2023-07-07 DIAGNOSIS — J449 Chronic obstructive pulmonary disease, unspecified: Secondary | ICD-10-CM | POA: Diagnosis not present

## 2023-07-07 DIAGNOSIS — E039 Hypothyroidism, unspecified: Secondary | ICD-10-CM | POA: Diagnosis not present

## 2023-07-07 DIAGNOSIS — I10 Essential (primary) hypertension: Secondary | ICD-10-CM | POA: Diagnosis not present

## 2023-07-07 DIAGNOSIS — R1310 Dysphagia, unspecified: Secondary | ICD-10-CM

## 2023-07-07 DIAGNOSIS — R1314 Dysphagia, pharyngoesophageal phase: Secondary | ICD-10-CM | POA: Insufficient documentation

## 2023-07-07 DIAGNOSIS — F17200 Nicotine dependence, unspecified, uncomplicated: Secondary | ICD-10-CM | POA: Diagnosis not present

## 2023-07-07 DIAGNOSIS — F17210 Nicotine dependence, cigarettes, uncomplicated: Secondary | ICD-10-CM | POA: Diagnosis not present

## 2023-07-07 HISTORY — PX: ESOPHAGOGASTRODUODENOSCOPY (EGD) WITH PROPOFOL: SHX5813

## 2023-07-07 HISTORY — PX: MALONEY DILATION: SHX5535

## 2023-07-07 SURGERY — ESOPHAGOGASTRODUODENOSCOPY (EGD) WITH PROPOFOL
Anesthesia: General

## 2023-07-07 MED ORDER — LIDOCAINE HCL (PF) 2 % IJ SOLN
INTRAMUSCULAR | Status: AC
  Filled 2023-07-07: qty 5

## 2023-07-07 MED ORDER — PROPOFOL 500 MG/50ML IV EMUL
INTRAVENOUS | Status: AC
  Filled 2023-07-07: qty 50

## 2023-07-07 MED ORDER — LACTATED RINGERS IV SOLN: INTRAVENOUS | Status: DC | PRN

## 2023-07-07 MED ORDER — LIDOCAINE HCL (PF) 2 % IJ SOLN
INTRAMUSCULAR | Status: DC | PRN
Start: 2023-07-07 — End: 2023-07-07
  Administered 2023-07-07: 50 mg via INTRADERMAL

## 2023-07-07 MED ORDER — PROPOFOL 10 MG/ML IV BOLUS
INTRAVENOUS | Status: DC | PRN
  Administered 2023-07-07: 50 mg via INTRAVENOUS
  Administered 2023-07-07: 180 ug/kg/min via INTRAVENOUS

## 2023-07-07 NOTE — Anesthesia Preprocedure Evaluation (Signed)
Anesthesia Evaluation  Patient identified by MRN, date of birth, ID band Patient awake    Reviewed: Allergy & Precautions, H&P , NPO status , Patient's Chart, lab work & pertinent test results, reviewed documented beta blocker date and time   History of Anesthesia Complications (+) history of anesthetic complications  Airway Mallampati: II  TM Distance: >3 FB Neck ROM: full    Dental no notable dental hx.    Pulmonary neg pulmonary ROS, COPD, Current Smoker and Patient abstained from smoking.   Pulmonary exam normal breath sounds clear to auscultation       Cardiovascular Exercise Tolerance: Good hypertension, negative cardio ROS  Rhythm:regular Rate:Normal     Neuro/Psych negative neurological ROS  negative psych ROS   GI/Hepatic negative GI ROS, Neg liver ROS,GERD  ,,  Endo/Other  negative endocrine ROSHypothyroidism    Renal/GU negative Renal ROS  negative genitourinary   Musculoskeletal   Abdominal   Peds  Hematology negative hematology ROS (+)   Anesthesia Other Findings   Reproductive/Obstetrics negative OB ROS                             Anesthesia Physical Anesthesia Plan  ASA: 3  Anesthesia Plan: General   Post-op Pain Management:    Induction:   PONV Risk Score and Plan: Propofol infusion  Airway Management Planned:   Additional Equipment:   Intra-op Plan:   Post-operative Plan:   Informed Consent: I have reviewed the patients History and Physical, chart, labs and discussed the procedure including the risks, benefits and alternatives for the proposed anesthesia with the patient or authorized representative who has indicated his/her understanding and acceptance.     Dental Advisory Given  Plan Discussed with: CRNA  Anesthesia Plan Comments:        Anesthesia Quick Evaluation

## 2023-07-07 NOTE — Op Note (Signed)
[facility] Patient Name: Jennifer [name] Procedure Date: 07/07/2023 10:52 AM MRN: [id number] Date of Birth: 1945/09/20 Attending MD: Gennette Pac , MD, [id number] CSN: [id number] Age: 77 Admit Type: Outpatient Procedure:                Upper GI endoscopy Indications:              Dysphagia Providers:                Gennette Pac, MD, Nena Polio, RN, Lennice Sites Technician, Technician Referring MD:              Medicines:                Propofol per Anesthesia Complications:            No immediate complications. Estimated Blood Loss:     Estimated blood loss was minimal. Procedure:                Pre-Anesthesia Assessment:                           - Prior to the procedure, a History and Physical                            was performed, and patient medications and                            allergies were reviewed. The patient's tolerance of                            previous anesthesia was also reviewed. The risks                            and benefits of the procedure and the sedation                            options and risks were discussed with the patient.                            All questions were answered, and informed consent                            was obtained. Prior Anticoagulants: The patient has                            taken no anticoagulant or antiplatelet agents. ASA                            Grade Assessment: III - A patient with severe                            systemic disease. After reviewing the risks and  benefits, the patient was deemed in satisfactory                            condition to undergo the procedure.                           After obtaining informed consent, the endoscope was                            passed under direct vision. Throughout the                            procedure, the patient's blood pressure, pulse, and                            oxygen  saturations were monitored continuously. The                            [id number]-[id number] ([id number]) scope was introduced through the                            mouth, and advanced to the second part of duodenum.                            The upper GI endoscopy was accomplished without                            difficulty. The patient tolerated the procedure                            well. Scope In: 11:17:24 AM Scope Out: 11:20:58 AM Total Procedure Duration: 0 hours 3 minutes 34 seconds  Findings:      The examined esophagus was normal. The scope was withdrawn. Dilation was       performed with a Maloney dilator with mild resistance at 56 Fr. The       dilation site was examined following endoscope reinsertion and showed no       change. Estimated blood loss: none.      A small hiatal hernia was present; otherwise, normal-appearing stomach.       Patent pylorus.      The duodenal bulb and second portion of the duodenum were normal. Impression:               - Normal esophagus. Dilated.                           - Small hiatal hernia.                           - Normal duodenal bulb and second portion of the                            duodenum.                           - No specimens collected. Moderate Sedation:  Moderate (conscious) sedation was personally administered by an       anesthesia professional. The following parameters were monitored: oxygen       saturation, heart rate, blood pressure, respiratory rate, EKG, adequacy       of pulmonary ventilation, and response to care. Recommendation:           - Discharge patient to home.                           - Advance diet as tolerated.                           - Continue present medications. Office visit with                            Lewie Loron in 6 weeks Procedure Code(s):        --- Professional ---                           [phone], Esophagogastroduodenoscopy, flexible,                            transoral; diagnostic,  including collection of                            specimen(s) by brushing or washing, when performed                            (separate procedure)                           43450, Dilation of esophagus, by unguided sound or                            bougie, single or multiple passes Diagnosis Code(s):        --- Professional ---                           K44.9, Diaphragmatic hernia without obstruction or                            gangrene                           R13.10, Dysphagia, unspecified CPT copyright 2022 [employer] Association. All rights reserved. The codes documented in this report are preliminary and upon coder review may  be revised to meet current compliance requirements. [name]. Ronica Vivian, MD Gennette Pac, MD 07/07/2023 11:26:43 AM This report has been signed electronically. Number of Addenda: 0

## 2023-07-07 NOTE — Anesthesia Postprocedure Evaluation (Signed)
Anesthesia Post Note  Patient: Jennifer [name]  Procedure(s) Performed: ESOPHAGOGASTRODUODENOSCOPY (EGD) WITH PROPOFOL MALONEY DILATION  Patient location during evaluation: Phase II Anesthesia Type: General Level of consciousness: awake Pain management: pain level controlled Vital Signs Assessment: post-procedure vital signs reviewed and stable Respiratory status: spontaneous breathing and respiratory function stable Cardiovascular status: blood pressure returned to baseline and stable Postop Assessment: no headache and no apparent nausea or vomiting Anesthetic complications: no Comments: Late entry   No notable events documented.   Last Vitals:  Vitals:   07/07/23 0945 07/07/23 1129  BP: 113/86 118/79  Pulse: 87 94  Resp: 19 18  Temp: 36.6 C 36.4 C  SpO2: 96% 97%    Last Pain:  Vitals:   07/07/23 1129  TempSrc: Axillary  PainSc: 0-No pain                 Windell Norfolk

## 2023-07-07 NOTE — H&P (Signed)
@LOGO @   Primary Care Physician:  Benita Stabile, MD Primary Gastroenterologist:  Dr. Jena Gauss  Pre-Procedure History & Physical: HPI:  Jennifer [name] is a 77 y.o. female here for  further evaluation of esophageal dysphagia via EGD.  Esophageal dilation is feasible/appropriate per plan today.  CT recently revealed only diverticulosis.  Past Medical History:  Diagnosis Date   Complication of anesthesia    difficulty waking up after hysterectomy   COPD (chronic obstructive pulmonary disease) (HCC)    Fatty liver    GERD (gastroesophageal reflux disease)    History of colon polyps    remote past, unable to retrieve records    Hyperlipidemia    Hypertension    Hypothyroidism    Lymphocytic colitis    Rheumatoid arthritis (HCC)     Past Surgical History:  Procedure Laterality Date   cataract  surgery Bilateral    COLONOSCOPY     last one was about 3 yrs in [facility]   COLONOSCOPY N/A 02/19/2016   Dr. Jena Gauss: three 3-6 mm polyps (tubular adenomas) in ascending colon, segmental biopsies consistent with lymphocytic colitis. Prescribed entocort.    COLONOSCOPY N/A 03/06/2019   Dr. Jena Gauss: 2 polyps removed, tubular adenomas, diverticulosis.  NO random colon biopsies performed.  Next colonoscopy 5 years.   ESOPHAGOGASTRODUODENOSCOPY N/A 08/11/2014   RMR: MIld erosive  reflux esophagitis. small hiatal hernia. Abnormal gastric muocsa uncertain significance. Subtly abnormal duodeanl (bulbar) mucosa- status post multiple biopsies. BENIGN small bowel mucosa, no evidence of villous blulnting, mild reactive gastropathy on stomach biopsy    ESOPHAGOGASTRODUODENOSCOPY (EGD) WITH PROPOFOL N/A 05/31/2021   normal   GALLBLADDER SURGERY     LEG SURGERY     right   MALONEY DILATION N/A 05/31/2021   Procedure: [name] DILATION;  Surgeon: Corbin Ade, MD;  [facility]: [facility];  Service: Endoscopy;  Laterality: N/A;   PARTIAL HYSTERECTOMY      Prior to Admission medications   Medication Sig  Start Date End Date Taking? Authorizing Provider  acetaminophen (TYLENOL) 650 MG CR tablet Take 650 mg by mouth every 8 (eight) hours as needed for pain.   Yes [provider]  albuterol (VENTOLIN HFA) 108 (90 Base) MCG/ACT inhaler Inhale 1-2 puffs into the lungs every 6 (six) hours as needed for shortness of breath or wheezing.   Yes [provider]  bismuth subsalicylate (STOMACH RELIEF) 262 MG chewable tablet chew 3 TABLETS BY MOUTH THREE TIMES DAILY 06/09/22  Yes [name], NP  Calcium Carb-Cholecalciferol (CALCIUM 600+D3 PO) Take 2 tablets by mouth in the morning.   Yes [provider]  Camphor-Menthol-Methyl Sal (SALONPAS) 3.08-06-08 % PTCH Apply 1 Application topically daily as needed (pain).   Yes [provider]  cycloSPORINE (RESTASIS) 0.05 % ophthalmic emulsion Place 1 drop into both eyes in the morning, at noon, and at bedtime.   Yes [provider]  diclofenac Sodium (VOLTAREN) 1 % GEL Apply 2 g topically daily as needed (pain).   Yes [provider]  dicyclomine (BENTYL) 10 MG capsule TAKE ONE CAPSULE BY MOUTH EVERY MORNING, ONE AT NOON, ONE EVERY EVENING and ONE EVERYDAY AT BEDTIME 01/05/23  Yes [name], NP  fluticasone [facility]) 50 MCG/ACT nasal spray Place 2 sprays into both nostrils in the morning. 03/23/22  Yes [provider]  HYDROcodone bit-homatropine (HYDROMET) 5-1.5 MG/5ML syrup Take 5 mL every 4-6 hours by oral route as needed. 04/13/23  Yes [provider]  hydrocortisone (ANUSOL-HC) 2.5 % rectal  cream APPLY 1 APPLICATION RECTALLY TWICE DAILY. TO RECTUM FOR HEMORRHOID DISCOMFORT OR BLEEDING 04/21/23  Yes [name], NP  hydrOXYzine (VISTARIL) 25 MG capsule Take 25 mg by mouth daily as needed for itching.   Yes [provider]  ipratropium-albuterol (DUONEB) 0.5-2.5 (3) MG/3ML SOLN Inhale 3 mLs into the lungs 2 (two) times daily. 03/07/22  Yes [provider]  ketoconazole (NIZORAL) 2  % cream APPLY 1 APPLICATION TOPICALLY TWICE DAILY BETWEEN BUTTOCKS FOLDS FOR YEAST *REFILL REQUEST* 03/29/23  Yes Taylie Helder, [name], MD  levothyroxine (SYNTHROID) 50 MCG tablet Take 75 mcg by mouth daily before breakfast.   Yes [provider]  methocarbamol (ROBAXIN) 500 MG tablet Take 500 mg by mouth daily as needed for muscle spasms.   Yes [provider]  mirabegron ER (MYRBETRIQ) 25 MG TB24 tablet Take 25 mg by mouth at bedtime.   Yes [provider]  potassium chloride SA (KLOR-CON M) 20 MEQ tablet Take 20 mEq by mouth 2 (two) times daily. may take an additional dose in the afternoon if taking 2 tablets of furosemide.   Yes [provider]  predniSONE (DELTASONE) 5 MG tablet Take 1 dose pk by oral route.   Yes [provider]  simvastatin (ZOCOR) 40 MG tablet Take 40 mg by mouth at bedtime.   Yes [provider]  sodium chloride (OCEAN) 0.65 % SOLN nasal spray Place 1 spray into both nostrils as needed for congestion.   Yes [provider]  PRESCRIPTION MEDICATION Take 40 mg by mouth daily. APTALIS Patient not taking: Reported on 04/20/2023    [provider]  PROLIA 60 MG/ML SOSY injection Inject 60 mg into the skin every 6 (six) months. 05/14/20   [provider]    Allergies as of 07/03/2023 - Review Complete 04/20/2023  Allergen Reaction Noted   Desyrel [trazodone] Other (See Comments) 07/14/2014   Keflex [cephalexin] Other (See Comments) 07/14/2014   Klonopin [clonazepam] Other (See Comments) 07/14/2014   Minocin [minocycline hcl] Other (See Comments) 07/14/2014   Stelazine [trifluoperazine]  07/14/2014   Viberzi [eluxadoline]  03/31/2015    Family History  Problem Relation Age of Onset   Cancer Sister        blood disorder ??   Colon cancer Neg Hx     Social History   Socioeconomic History   Marital status: Widowed    Spouse name: Not on file   Number of children: 2   Years of education: Not  on file   Highest education level: Not on file  Occupational History   Occupation: disability  Tobacco Use   Smoking status: Every Day    Current packs/day: 0.25    Types: Cigarettes    Passive exposure: Current   Smokeless tobacco: Never  Vaping Use   Vaping status: Never Used  Substance and Sexual Activity   Alcohol use: No    Alcohol/week: 0.0 standard drinks of alcohol   Drug use: No   Sexual activity: Not Currently  Other Topics Concern   Not on file  Social History Narrative   Not on file   Social Determinants of Health   Financial Resource Strain: Not on file  Food Insecurity: Not on file  Transportation Needs: Not on file  Physical Activity: Not on file  Stress: Not on file  Social Connections: Not on file  Intimate Partner Violence: Not on file    Review of Systems: See HPI, otherwise negative ROS  Physical Exam: BP 113/86  Pulse 87   Temp 97.9 F (36.6 C) (Oral)   Resp 19   SpO2 96%  General:   Alert,  Well-developed, well-nourished, pleasant and cooperative in NAD Neck:  Supple; no masses or thyromegaly. No significant cervical adenopathy. Lungs:  Clear throughout to auscultation.   No wheezes, crackles, or rhonchi. No acute distress. Heart:  Regular rate and rhythm; no murmurs, clicks, rubs,  or gallops. Abdomen: Non-distended, normal bowel sounds.  Soft and nontender without appreciable mass or hepatosplenomegaly.   Impression/Plan:    77 year old lady with esophageal dysphagia.  She is here for further evaluation via EGD.  I have offered the patient an EGD with esophageal dilation as feasible/appropriate per plan.  The risks, benefits, limitations, alternatives and imponderables have been reviewed with the patient. Potential for esophageal dilation, biopsy, etc. have also been reviewed.  Questions have been answered. All parties agreeable.      Notice: This dictation was prepared with Dragon dictation along with smaller phrase technology. Any  transcriptional errors that result from this process are unintentional and may not be corrected upon review.

## 2023-07-07 NOTE — Transfer of Care (Signed)
Immediate Anesthesia Transfer of Care Note  Patient: Jennifer [name]  Procedure(s) Performed: ESOPHAGOGASTRODUODENOSCOPY (EGD) WITH PROPOFOL MALONEY DILATION  Patient Location: Short Stay  Anesthesia Type:General  Level of Consciousness: awake, alert , and oriented  Airway & Oxygen Therapy: Patient Spontanous Breathing  Post-op Assessment: Report given to RN, Post -op Vital signs reviewed and stable, Patient moving all extremities X 4, and Patient able to stick tongue midline  Post vital signs: Reviewed and stable  Last Vitals:  Vitals Value Taken Time  BP 113/86   Temp 97.6   Pulse 97   Resp 16   SpO2 94     Last Pain:  Vitals:   07/07/23 0945  TempSrc: Oral  PainSc: 0-No pain         Complications: No notable events documented.

## 2023-07-07 NOTE — Discharge Instructions (Signed)
EGD Discharge instructions Please read the instructions outlined below and refer to this sheet in the next few weeks. These discharge instructions provide you with general information on caring for yourself after you leave the hospital. Your doctor may also give you specific instructions. While your treatment has been planned according to the most current medical practices available, unavoidable complications occasionally occur. If you have any problems or questions after discharge, please call your doctor. ACTIVITY You may resume your regular activity but move at a slower pace for the next 24 hours.  Take frequent rest periods for the next 24 hours.  Walking will help expel (get rid of) the air and reduce the bloated feeling in your abdomen.  No driving for 24 hours (because of the anesthesia (medicine) used during the test).  You may shower.  Do not sign any important legal documents or operate any machinery for 24 hours (because of the anesthesia used during the test).  NUTRITION Drink plenty of fluids.  You may resume your normal diet.  Begin with a light meal and progress to your normal diet.  Avoid alcoholic beverages for 24 hours or as instructed by your caregiver.  MEDICATIONS You may resume your normal medications unless your caregiver tells you otherwise.  WHAT YOU CAN EXPECT TODAY You may experience abdominal discomfort such as a feeling of fullness or "gas" pains.  FOLLOW-UP Your doctor will discuss the results of your test with you.  SEEK IMMEDIATE MEDICAL ATTENTION IF ANY OF THE FOLLOWING OCCUR: Excessive nausea (feeling sick to your stomach) and/or vomiting.  Severe abdominal pain and distention (swelling).  Trouble swallowing.  Temperature over 101 F (37.8 C).  Rectal bleeding or vomiting of blood.       your esophagus was stretched today.   Office follow-up with Lewie Loron in 6 weeks  At patient request, I called [place] [employer] at [phone]

## 2023-07-17 ENCOUNTER — Encounter (HOSPITAL_COMMUNITY): Payer: Self-pay | Admitting: Internal Medicine

## 2023-07-18 ENCOUNTER — Other Ambulatory Visit: Payer: Self-pay | Admitting: Gastroenterology

## 2023-07-18 DIAGNOSIS — R197 Diarrhea, unspecified: Secondary | ICD-10-CM

## 2023-08-22 ENCOUNTER — Encounter: Payer: Self-pay | Admitting: Internal Medicine

## 2023-08-22 ENCOUNTER — Ambulatory Visit (INDEPENDENT_AMBULATORY_CARE_PROVIDER_SITE_OTHER): Payer: No Typology Code available for payment source | Admitting: Internal Medicine

## 2023-08-22 VITALS — BP 114/74 | HR 99 | Temp 98.1°F | Ht 62.0 in | Wt 133.2 lb

## 2023-08-22 DIAGNOSIS — Z8719 Personal history of other diseases of the digestive system: Secondary | ICD-10-CM

## 2023-08-22 DIAGNOSIS — Z860101 Personal history of adenomatous and serrated colon polyps: Secondary | ICD-10-CM | POA: Diagnosis not present

## 2023-08-22 DIAGNOSIS — R1310 Dysphagia, unspecified: Secondary | ICD-10-CM

## 2023-08-22 DIAGNOSIS — K529 Noninfective gastroenteritis and colitis, unspecified: Secondary | ICD-10-CM

## 2023-08-22 DIAGNOSIS — K52832 Lymphocytic colitis: Secondary | ICD-10-CM

## 2023-08-22 NOTE — Patient Instructions (Addendum)
Good to see you again today  Will schedule colonoscopy in the near future.  Change in bowel habits and history of polyps ASA 3  Clear liquid diet for 2 days prior to the procedure  Zofran 4 mg ODT tablets dispense 10.  May take 1 under the tongue every 6 hours during the prep..  No refills.  Linzess 290 gelcap take 1 each day for 3 days prior to the procedure  4 L of GoLytely to be split as follows take 2 L in the early evening 1400 1800 night before the procedure  Morning of the procedure take the other 2 L 500-700  Tapwater enemas the morning of the procedure  Not take anymore Bentyl.

## 2023-08-22 NOTE — Progress Notes (Signed)
Primary Care Physician:  Benita Stabile, MD Primary Gastroenterologist:  Dr. Jena Gauss  Pre-Procedure History & Physical: HPI:  Jennifer [name] is a 78 y.o. female here for follow-up altered bowel function.  Historically has diarrhea intermittently punctuated by constipation much more constipation recently.  History of diverticulitis history of colonic polyps she is in need of a good high-quality colonoscopy multiple times previously thwarted inability to take prep prep due to nausea. History of lymphocytic colitis.  EGD recently for dysphagia;  54 French Maloney dilator passed.  Resolution of dysphagia.  GERD well-controlled on diet.  Past Medical History:  Diagnosis Date   Complication of anesthesia    difficulty waking up after hysterectomy   COPD (chronic obstructive pulmonary disease) (HCC)    Fatty liver    GERD (gastroesophageal reflux disease)    History of colon polyps    remote past, unable to retrieve records    Hyperlipidemia    Hypertension    Hypothyroidism    Lymphocytic colitis    Rheumatoid arthritis (HCC)     Past Surgical History:  Procedure Laterality Date   cataract  surgery Bilateral    COLONOSCOPY     last one was about 3 yrs in [facility]   COLONOSCOPY N/A 02/19/2016   Dr. Jena Gauss: three 3-6 mm polyps (tubular adenomas) in ascending colon, segmental biopsies consistent with lymphocytic colitis. Prescribed entocort.    COLONOSCOPY N/A 03/06/2019   Dr. Jena Gauss: 2 polyps removed, tubular adenomas, diverticulosis.  NO random colon biopsies performed.  Next colonoscopy 5 years.   ESOPHAGOGASTRODUODENOSCOPY N/A 08/11/2014   RMR: MIld erosive  reflux esophagitis. small hiatal hernia. Abnormal gastric muocsa uncertain significance. Subtly abnormal duodeanl (bulbar) mucosa- status post multiple biopsies. BENIGN small bowel mucosa, no evidence of villous blulnting, mild reactive gastropathy on stomach biopsy    ESOPHAGOGASTRODUODENOSCOPY (EGD) WITH PROPOFOL N/A  05/31/2021   normal   ESOPHAGOGASTRODUODENOSCOPY (EGD) WITH PROPOFOL N/A 07/07/2023   Procedure: ESOPHAGOGASTRODUODENOSCOPY (EGD) WITH PROPOFOL;  Surgeon: Corbin Ade, MD;  Location: [facility];  Service: Endoscopy;  Laterality: N/A;  11:15AM, ASA 3   GALLBLADDER SURGERY     LEG SURGERY     right   MALONEY DILATION N/A 05/31/2021   Procedure: MALONEY DILATION;  Surgeon: Corbin Ade, MD;  [facility]: [facility];  Service: Endoscopy;  Laterality: N/A;   MALONEY DILATION N/A 07/07/2023   Procedure: [name] DILATION;  Surgeon: Corbin Ade, MD;  [facility];  Service: Endoscopy;  Laterality: N/A;   PARTIAL HYSTERECTOMY      Prior to Admission medications   Medication Sig Start Date End Date Taking? Authorizing Provider  acetaminophen (TYLENOL) 650 MG CR tablet Take 650 mg by mouth every 8 (eight) hours as needed for pain.    [provider]  albuterol (VENTOLIN HFA) 108 (90 Base) MCG/ACT inhaler Inhale 1-2 puffs into the lungs every 6 (six) hours as needed for shortness of breath or wheezing.    [provider]  bismuth subsalicylate (STOMACH RELIEF) 262 MG chewable tablet chew 3 TABLETS BY MOUTH THREE TIMES DAILY 06/09/22   [name], NP  Calcium Carb-Cholecalciferol (CALCIUM 600+D3 PO) Take 2 tablets by mouth in the morning.    [provider]  Camphor-Menthol-Methyl Sal (SALONPAS) 3.08-06-08 % PTCH Apply 1 Application topically daily as needed (pain).    [provider]  cycloSPORINE (RESTASIS) 0.05 % ophthalmic emulsion Place 1 drop into both eyes in the morning, at noon, and at bedtime.  [provider]  diclofenac Sodium (VOLTAREN) 1 % GEL Apply 2 g topically daily as needed (pain).    [provider]  dicyclomine (BENTYL) 10 MG capsule TAKE 1 CAPSULE BY MOUTH EVERY MORNING, TAKE 1 CAPSULE AT NOON, TAKE 1 CAPSULE EACH EVENING AND TAKE 1 CAPSULE AT BEDTIME 07/19/23   [name], NP  fluticasone [facility]) 50  MCG/ACT nasal spray Place 2 sprays into both nostrils in the morning. 03/23/22   [provider]  HYDROcodone bit-homatropine (HYDROMET) 5-1.5 MG/5ML syrup Take 5 mL every 4-6 hours by oral route as needed. 04/13/23   [provider]  hydrocortisone (ANUSOL-HC) 2.5 % rectal cream APPLY 1 APPLICATION RECTALLY TWICE DAILY. TO RECTUM FOR HEMORRHOID DISCOMFORT OR BLEEDING 04/21/23   [name], NP  hydrOXYzine (VISTARIL) 25 MG capsule Take 25 mg by mouth daily as needed for itching.    [provider]  ipratropium-albuterol (DUONEB) 0.5-2.5 (3) MG/3ML SOLN Inhale 3 mLs into the lungs 2 (two) times daily. 03/07/22   [provider]  ketoconazole (NIZORAL) 2 % cream APPLY 1 APPLICATION TOPICALLY TWICE DAILY BETWEEN BUTTOCKS FOLDS FOR YEAST *REFILL REQUEST* 03/29/23   Tilak Oakley, [name], MD  levothyroxine (SYNTHROID) 50 MCG tablet Take 75 mcg by mouth daily before breakfast.    [provider]  methocarbamol (ROBAXIN) 500 MG tablet Take 500 mg by mouth daily as needed for muscle spasms.    [provider]  mirabegron ER (MYRBETRIQ) 25 MG TB24 tablet Take 25 mg by mouth at bedtime.    [provider]  potassium chloride SA (KLOR-CON M) 20 MEQ tablet Take 20 mEq by mouth 2 (two) times daily. may take an additional dose in the afternoon if taking 2 tablets of furosemide.    [provider]  predniSONE (DELTASONE) 5 MG tablet Take 1 dose pk by oral route.    [provider]  PRESCRIPTION MEDICATION Take 40 mg by mouth daily. APTALIS Patient not taking: Reported on 04/20/2023    [provider]  PROLIA 60 MG/ML SOSY injection Inject 60 mg into the skin every 6 (six) months. 05/14/20   [provider]  simvastatin (ZOCOR) 40 MG tablet Take 40 mg by mouth at bedtime.    [provider]  sodium chloride (OCEAN) 0.65 % SOLN nasal spray Place 1 spray into both nostrils as needed for congestion.    [provider]    Allergies as of 08/22/2023 - Review Complete 07/07/2023  Allergen Reaction Noted   Desyrel [trazodone] Other (See Comments) 07/14/2014   Keflex [cephalexin] Other (See Comments) 07/14/2014   Klonopin [clonazepam] Other (See Comments) 07/14/2014   Minocin [minocycline hcl] Other (See Comments) 07/14/2014   Stelazine [trifluoperazine]  07/14/2014   Viberzi [eluxadoline]  03/31/2015    Family History  Problem Relation Age of Onset   Cancer Sister        blood disorder ??   Colon cancer Neg Hx     Social History   Socioeconomic History   Marital status: Widowed    Spouse name: Not on file   Number of children: 2   Years of education: Not on file   Highest education level: Not on file  Occupational History   Occupation: disability  Tobacco Use   Smoking status: Every Day    Current packs/day: 0.25    Types: Cigarettes    Passive exposure: Current   Smokeless tobacco: Never  Vaping Use   Vaping status: Never Used  Substance  and Sexual Activity   Alcohol use: No    Alcohol/week: 0.0 standard drinks of alcohol   Drug use: No   Sexual activity: Not Currently  Other Topics Concern   Not on file  Social History Narrative   Not on file   Social Drivers of Health   Financial Resource Strain: Not on file  Food Insecurity: Not on file  Transportation Needs: Not on file  Physical Activity: Not on file  Stress: Not on file  Social Connections: Not on file  Intimate Partner Violence: Not on file    Review of Systems: See HPI, otherwise negative ROS  Physical Exam: There were no vitals taken for this visit. General:   Alert,  Well-developed, well-nourished, pleasant and cooperative in NAD Neck:  Supple; no masses or thyromegaly. No significant cervical adenopathy. Lungs:  Clear throughout to auscultation.   No wheezes, crackles, or rhonchi. No acute distress. Heart:  Regular rate and rhythm; no murmurs, clicks, rubs,  or gallops. Abdomen: Non-distended, normal  bowel sounds.  Soft and nontender without appreciable mass or hepatosplenomegaly.    Impression/Plan: Jennifer [name] is a 78 year old lady with a history of lymphocytic colitis more recently alternating constipation and diarrhea.  History of colonic adenoma history of diverticulitis.  She is due a follow-up colonoscopy.  Inability to take prep has made her evaluation more difficult. Given fluctuating symptoms I doubt this lady has any sort of hypersecretory state such as carcinoid or ZollingerEverardo All.  Dysphagia improved after empirically dilated a normal-appearing esophagus.  Recommendations:  I have offered the patient a diagnostic colonoscopy.  Colonoscopy in the near future.  Change in bowel habits and history of polyps ASA 3.  The risks, benefits, limitations, alternatives and imponderables have been reviewed with the patient. Questions have been answered. All parties are agreeable.    Clear liquid diet for 2 days prior to the procedure  Zofran 4 mg ODT tablets dispense 10.  May take 1 under the tongue every 6 hours during the prep..  No refills.  Linzess 290 gelcap take 1 each day for 3 days prior to the procedure  4 L of GoLytely to be split as follows take 2 L in the early evening 1400 1800 night before the procedure  Morning of the procedure take the other 2 L 500-700  Tapwater enemas the morning of the procedure  Discontinue dicyclomine.  Notice: This dictation was prepared with Dragon dictation along with smaller phrase technology. Any transcriptional errors that result from this process are unintentional and may not be corrected upon review.

## 2023-08-23 ENCOUNTER — Encounter: Payer: Self-pay | Admitting: *Deleted

## 2023-08-23 ENCOUNTER — Other Ambulatory Visit: Payer: Self-pay | Admitting: *Deleted

## 2023-08-23 MED ORDER — PEG 3350-KCL-NA BICARB-NACL 420 G PO SOLR: 4000.0000 mL | Freq: Once | ORAL | 0 refills | Status: AC

## 2023-08-23 MED ORDER — ONDANSETRON 4 MG PO TBDP: 4.0000 mg | ORAL_TABLET | Freq: Four times a day (QID) | ORAL | 0 refills | Status: DC

## 2023-09-14 DIAGNOSIS — F17211 Nicotine dependence, cigarettes, in remission: Secondary | ICD-10-CM | POA: Diagnosis not present

## 2023-09-14 DIAGNOSIS — Z008 Encounter for other general examination: Secondary | ICD-10-CM | POA: Diagnosis not present

## 2023-09-14 DIAGNOSIS — J449 Chronic obstructive pulmonary disease, unspecified: Secondary | ICD-10-CM | POA: Diagnosis not present

## 2023-09-14 DIAGNOSIS — E785 Hyperlipidemia, unspecified: Secondary | ICD-10-CM | POA: Diagnosis not present

## 2023-09-14 DIAGNOSIS — E039 Hypothyroidism, unspecified: Secondary | ICD-10-CM | POA: Diagnosis not present

## 2023-09-14 DIAGNOSIS — M810 Age-related osteoporosis without current pathological fracture: Secondary | ICD-10-CM | POA: Diagnosis not present

## 2023-09-27 DIAGNOSIS — J449 Chronic obstructive pulmonary disease, unspecified: Secondary | ICD-10-CM | POA: Diagnosis not present

## 2023-09-27 DIAGNOSIS — Z515 Encounter for palliative care: Secondary | ICD-10-CM | POA: Diagnosis not present

## 2023-09-28 NOTE — Patient Instructions (Signed)
 Jennifer [name]  09/28/2023     @PREFPERIOPPHARMACY @   Your procedure is scheduled on  10/04/2023.   Report to [name] at  0730  A.M.   Call this number if you have problems the morning of surgery:  [phone]  If you experience any cold or flu symptoms such as cough, fever, chills, shortness of breath, etc. between now and your scheduled surgery, please notify us at the above number.   Remember:         Use your nebulizer and your inhaler before you come and bring your rescue inhaler with you.   Follow the diet and prep instructions given to you by the office.   You may drink clear liquids until 0530 am on 10/04/2023.    Clear liquids allowed are:                    Water, Juice (No red color; non-citric and without pulp; diabetics please choose diet or no sugar options), Carbonated beverages (diabetics please choose diet or no sugar options), Clear Tea (No creamer, milk, or cream, including half & half and powdered creamer), Black Coffee Only (No creamer, milk or cream, including half & half and powdered creamer), and Clear Sports drink (No red color; diabetics please choose diet or no sugar options)    Take these medicines the morning of surgery with A SIP OF WATER        hydromet(if needed),hydroxyzine, levothyroxine, methocarbamol, zofran (if needed).     Do not wear jewelry, make-up or nail polish, including gel polish,  artificial nails, or any other type of covering on natural nails (fingers and  toes).  Do not wear lotions, powders, or perfumes, or deodorant.  Do not shave 48 hours prior to surgery.  Men may shave face and neck.  Do not bring valuables to the hospital.  [facility] is not responsible for any belongings or valuables.  Contacts, dentures or bridgework may not be worn into surgery.  Leave your suitcase in the car.  After surgery it may be brought to your room.  For patients admitted to the hospital, discharge time will be determined by your  treatment team.  Patients discharged the day of surgery will not be allowed to drive home and must have someone with them for 24 hours.    Special instructions:   DO NOT smoke tobacco or vape for 24 hours before your procedure.  Please read over the following fact sheets that you were given. Anesthesia Post-op Instructions and Care and Recovery After Surgery      Colonoscopy, Adult, Care After The following information offers guidance on how to care for yourself after your procedure. Your health care provider may also give you more specific instructions. If you have problems or questions, contact your health care provider. What can I expect after the procedure? After the procedure, it is common to have: A small amount of blood in your stool for 24 hours after the procedure. Some gas. Mild cramping or bloating of your abdomen. Follow these instructions at home: Eating and drinking  Drink enough fluid to keep your urine pale yellow. Follow instructions from your health care provider about eating or drinking restrictions. Resume your normal diet as told by your health care provider. Avoid heavy or fried foods that are hard to digest. Activity Rest as told by your health care provider. Avoid sitting for a long time without moving. Get up to take short walks  every 1-2 hours. This is important to improve blood flow and breathing. Ask for help if you feel weak or unsteady. Return to your normal activities as told by your health care provider. Ask your health care provider what activities are safe for you. Managing cramping and bloating  Try walking around when you have cramps or feel bloated. If directed, apply heat to your abdomen as told by your health care provider. Use the heat source that your health care provider recommends, such as a moist heat pack or a heating pad. Place a towel between your skin and the heat source. Leave the heat on for 20-30 minutes. Remove the heat if your  skin turns bright red. This is especially important if you are unable to feel pain, heat, or cold. You have a greater risk of getting burned. General instructions If you were given a sedative during the procedure, it can affect you for several hours. Do not drive or operate machinery until your health care provider says that it is safe. For the first 24 hours after the procedure: Do not sign important documents. Do not drink alcohol. Do your regular daily activities at a slower pace than normal. Eat soft foods that are easy to digest. Take over-the-counter and prescription medicines only as told by your health care provider. Keep all follow-up visits. This is important. Contact a health care provider if: You have blood in your stool 2-3 days after the procedure. Get help right away if: You have more than a small spotting of blood in your stool. You have large blood clots in your stool. You have swelling of your abdomen. You have nausea or vomiting. You have a fever. You have increasing pain in your abdomen that is not relieved with medicine. These symptoms may be an emergency. Get help right away. Call 911. Do not wait to see if the symptoms will go away. Do not drive yourself to the hospital. Summary After the procedure, it is common to have a small amount of blood in your stool. You may also have mild cramping and bloating of your abdomen. If you were given a sedative during the procedure, it can affect you for several hours. Do not drive or operate machinery until your health care provider says that it is safe. Get help right away if you have a lot of blood in your stool, nausea or vomiting, a fever, or increased pain in your abdomen. This information is not intended to replace advice given to you by your health care provider. Make sure you discuss any questions you have with your health care provider. Document Revised: 08/30/2022 Document Reviewed: 03/10/2021 Elsevier Patient  Education  2024 [employer].Monitored Anesthesia Care, Care After The following information offers guidance on how to care for yourself after your procedure. Your health care provider may also give you more specific instructions. If you have problems or questions, contact your health care provider. What can I expect after the procedure? After the procedure, it is common to have: Tiredness. Little or no memory about what happened during or after the procedure. Impaired judgment when it comes to making decisions. Nausea or vomiting. Some trouble with balance. Follow these instructions at home: For the time period you were told by your health care provider:  Rest. Do not participate in activities where you could fall or become injured. Do not drive or use machinery. Do not drink alcohol. Do not take sleeping pills or medicines that cause drowsiness. Do not make important decisions or  sign legal documents. Do not take care of children on your own. Medicines Take over-the-counter and prescription medicines only as told by your health care provider. If you were prescribed antibiotics, take them as told by your health care provider. Do not stop using the antibiotic even if you start to feel better. Eating and drinking Follow instructions from your health care provider about what you may eat and drink. Drink enough fluid to keep your urine pale yellow. If you vomit: Drink clear fluids slowly and in small amounts as you are able. Clear fluids include water, ice chips, low-calorie sports drinks, and fruit juice that has water added to it (diluted fruit juice). Eat light and bland foods in small amounts as you are able. These foods include bananas, applesauce, rice, lean meats, toast, and crackers. General instructions  Have a responsible adult stay with you for the time you are told. It is important to have someone help care for you until you are awake and alert. If you have sleep apnea,  surgery and some medicines can increase your risk for breathing problems. Follow instructions from your health care provider about wearing your sleep device: When you are sleeping. This includes during daytime naps. While taking prescription pain medicines, sleeping medicines, or medicines that make you drowsy. Do not use any products that contain nicotine or tobacco. These products include cigarettes, chewing tobacco, and vaping devices, such as e-cigarettes. If you need help quitting, ask your health care provider. Contact a health care provider if: You feel nauseous or vomit every time you eat or drink. You feel light-headed. You are still sleepy or having trouble with balance after 24 hours. You get a rash. You have a fever. You have redness or swelling around the IV site. Get help right away if: You have trouble breathing. You have new confusion after you get home. These symptoms may be an emergency. Get help right away. Call 911. Do not wait to see if the symptoms will go away. Do not drive yourself to the hospital. This information is not intended to replace advice given to you by your health care provider. Make sure you discuss any questions you have with your health care provider. Document Revised: 12/13/2021 Document Reviewed: 12/13/2021 Elsevier Patient Education  2024 [employer].

## 2023-10-02 ENCOUNTER — Encounter (HOSPITAL_COMMUNITY)
Admission: RE | Admit: 2023-10-02 | Discharge: 2023-10-02 | Disposition: A | Discharge status: Home / Self Care | Payer: No Typology Code available for payment source | Source: Ambulatory Visit | Attending: Internal Medicine | Admitting: Internal Medicine

## 2023-10-02 ENCOUNTER — Telehealth: Payer: Self-pay | Admitting: *Deleted

## 2023-10-02 ENCOUNTER — Encounter (HOSPITAL_COMMUNITY): Payer: Self-pay

## 2023-10-02 DIAGNOSIS — F17200 Nicotine dependence, unspecified, uncomplicated: Secondary | ICD-10-CM

## 2023-10-02 NOTE — Telephone Encounter (Signed)
 Pt called to cancel her procedure for 10/04/23. She does not wish to reschedule at this time.

## 2023-10-04 ENCOUNTER — Ambulatory Visit (HOSPITAL_COMMUNITY)
Admission: RE | Admit: 2023-10-04 | Payer: No Typology Code available for payment source | Source: Home / Self Care | Admitting: Internal Medicine

## 2023-10-04 ENCOUNTER — Encounter (HOSPITAL_COMMUNITY): Admission: RE | Payer: Self-pay | Source: Home / Self Care

## 2023-10-04 SURGERY — COLONOSCOPY WITH PROPOFOL
Anesthesia: Choice

## 2023-10-25 ENCOUNTER — Telehealth: Payer: Self-pay | Admitting: Internal Medicine

## 2023-10-25 NOTE — Telephone Encounter (Signed)
 I called patient and requested she contact Dr. Scharlene Gloss office and ask if they would fax her records prior to her appointment.  Also informed patient I will mail appointment information to her.  She voiced her understanding

## 2023-10-26 ENCOUNTER — Telehealth: Payer: Self-pay | Admitting: Internal Medicine

## 2023-10-26 NOTE — Telephone Encounter (Signed)
 Copied from CRM [id number]. Topic: Medical Record Request - Records Request >> Oct 26, 2023  9:25 AM Konrad Dolores wrote: Reason for CRM: Tajae stated she is going to be treated by Dr. Sherene Sires for COPD, however her medical records from Dr. Scharlene Gloss office needed to be sent over. Adalea wanted to let Dr. Sherene Sires know she would be going to Dr Scharlene Gloss office to fill out the Medical Record Request form before her upcoming appointment on April 29th.

## 2023-10-27 NOTE — Telephone Encounter (Signed)
Noted - no further action needed

## 2023-11-08 DIAGNOSIS — G8929 Other chronic pain: Secondary | ICD-10-CM | POA: Diagnosis not present

## 2023-11-08 DIAGNOSIS — K529 Noninfective gastroenteritis and colitis, unspecified: Secondary | ICD-10-CM | POA: Diagnosis not present

## 2023-11-08 DIAGNOSIS — M549 Dorsalgia, unspecified: Secondary | ICD-10-CM | POA: Diagnosis not present

## 2023-11-08 DIAGNOSIS — R1310 Dysphagia, unspecified: Secondary | ICD-10-CM | POA: Diagnosis not present

## 2023-11-27 DIAGNOSIS — M810 Age-related osteoporosis without current pathological fracture: Secondary | ICD-10-CM | POA: Diagnosis not present

## 2023-11-27 NOTE — Progress Notes (Unsigned)
 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    Also denies any obvious fluctuation of symptoms with weather or environmental changes or other aggravating or alleviating factors except as outlined above   No unusual exposure hx or h/o childhood pna/ asthma or knowledge of premature birth.  Current Allergies, Complete Past Medical History, Past Surgical History, Family History, and Social History were reviewed in [employer] record.  ROS  The following are not active complaints unless bolded Hoarseness, sore throat, dysphagia, dental problems, itching, sneezing,  nasal congestion or discharge of excess mucus or purulent secretions, ear ache,   fever, chills, sweats, unintended wt loss or wt gain, classically pleuritic or exertional cp,  orthopnea pnd or arm/hand swelling  or leg swelling, presyncope, palpitations, abdominal pain, anorexia, nausea, vomiting, diarrhea  or change in bowel habits or change in bladder habits, change in stools or change in urine, dysuria, hematuria,   rash, arthralgias, visual complaints, headache, numbness, weakness or ataxia or problems with walking or coordination,  change in mood or  memory.            Outpatient Medications Prior to Visit  Medication Sig Dispense Refill   Potassium Chloride  ER 20 MEQ TBCR Take 1 tablet by mouth 2 (two) times daily with a meal.     acetaminophen (TYLENOL) 650 MG CR tablet Take 650 mg by mouth every 8 (eight) hours as needed for pain.     albuterol (VENTOLIN HFA) 108 (90 Base) MCG/ACT inhaler Inhale 1-2 puffs into the lungs every 6 (six) hours as needed for shortness of breath or wheezing.     bismuth  subsalicylate (STOMACH RELIEF) 262 MG chewable tablet chew 3 TABLETS BY MOUTH THREE TIMES DAILY 270 tablet 1   Calcium Carb-Cholecalciferol (CALCIUM 600+D3 PO) Take 2 tablets by mouth in the morning.     Camphor-Menthol-Methyl Sal (SALONPAS) 3.08-06-08 % PTCH Apply 1 Application topically daily as needed (pain).     cycloSPORINE (RESTASIS) 0.05 % ophthalmic emulsion Place 1 drop into both eyes in the morning, at noon, and at bedtime.     diclofenac Sodium (VOLTAREN) 1 % GEL Apply 2 g topically daily as needed (pain).     dicyclomine  (BENTYL ) 10 MG capsule TAKE 1 CAPSULE BY MOUTH EVERY MORNING, TAKE 1 CAPSULE AT NOON, TAKE 1 CAPSULE EACH EVENING AND TAKE 1 CAPSULE AT BEDTIME 120 capsule 10   fluticasone (FLONASE) 50  MCG/ACT nasal spray Place 2 sprays into both nostrils in the morning.     HYDROcodone bit-homatropine (HYDROMET) 5-1.5 MG/5ML syrup Take 5 mL every 4-6 hours by oral route as needed.     hydrocortisone  (ANUSOL -HC) 2.5 % rectal cream APPLY 1 APPLICATION RECTALLY TWICE DAILY. TO RECTUM FOR HEMORRHOID DISCOMFORT OR BLEEDING 30 g 10   hydrOXYzine (VISTARIL) 25 MG capsule Take 25 mg by mouth daily as needed for itching.     ipratropium-albuterol (DUONEB) 0.5-2.5 (3) MG/3ML SOLN Inhale 3 mLs into the lungs 2 (two) times daily.     ketoconazole  (NIZORAL ) 2 % cream APPLY 1 APPLICATION TOPICALLY TWICE DAILY  BETWEEN BUTTOCKS FOLDS FOR YEAST *REFILL REQUEST* 60 g 0   levothyroxine (SYNTHROID) 75 MCG tablet Take 75 mcg by mouth daily.     methocarbamol (ROBAXIN) 500 MG tablet Take 500 mg by mouth daily as needed for muscle spasms.     mirabegron ER (MYRBETRIQ) 25 MG TB24 tablet Take 25 mg by mouth at bedtime.     ondansetron  (ZOFRAN -ODT) 4 MG disintegrating tablet Take 1 tablet (4 mg total) by mouth every 6 (six) hours. 10 tablet 0   PROLIA 60 MG/ML SOSY injection Inject 60 mg into the skin every 6 (six) months.     simvastatin (ZOCOR) 40 MG tablet Take 40 mg by mouth at bedtime.     sodium chloride  (OCEAN) 0.65 % SOLN nasal spray Place 1 spray into both nostrils as needed for congestion.     levothyroxine (SYNTHROID) 50 MCG tablet Take 75 mcg by mouth daily before breakfast.     potassium chloride  SA (KLOR-CON  M) 20 MEQ tablet Take 20 mEq by mouth 2 (two) times daily. may take an additional dose in the afternoon if taking 2 tablets of furosemide.     No facility-administered medications prior to visit.    Past Medical History:  Diagnosis Date   Complication of anesthesia    difficulty waking up after hysterectomy   COPD (chronic obstructive pulmonary disease) (HCC)    Fatty liver    GERD (gastroesophageal reflux disease)    History of colon polyps    remote past, unable to retrieve records    Hyperlipidemia    Hypertension    Hypothyroidism    Lymphocytic colitis    Rheumatoid arthritis (HCC)       Objective:     BP 99/67   Pulse 92   Ht 5\' 2"  (1.575 m)   Wt 139 lb (63 kg)   SpO2 92%   BMI 25.42 kg/m   SpO2: 92 % amb wf 4 h p duoneb/ hfa very poor  HEENT : Oropharynx  clear/ top denture   Nasal turbinates mod edema    NECK :  without  apparent JVD/ palpable Nodes/TM    LUNGS: no acc muscle use,  Mild barrel  contour chest wall with bilateral  Distant bs s audible wheeze and  without cough on insp or exp maneuvers  and mild  Hyperresonant  to  percussion bilaterally      CV:  RRR  no s3 or murmur or increase in P2, and no edema   ABD:  soft and nontender with pos end  insp Hoover's  in the supine position.  No bruits or organomegaly appreciated   MS:  Nl gait/ ext warm without deformities Or obvious joint restrictions  calf tenderness, cyanosis or clubbing     SKIN: warm and dry without lesions    NEURO:  alert, approp, nl  sensorium with  no motor or cerebellar deficits apparent.        Assessment   GOLD ? / still smoking Active smoker - onset of symptoms of doe/ cough x 2021 - LDSCT 05/06/22  very mild centrilobular and paraseptal emphysema  - 11/28/2023  After extensive coaching inhaler device,  effectiveness =    25%  - 11/28/2023 added bud 0.25 bid to duoneb  - Allergy screen 11/28/2023 >  Eos 0. /  IgE  and alpha one Phenotype  - 11/28/2023   Walked on RA  x  3  lap(s) =  approx 450  ft  @ mod pace, stopped due to back pain with lowest 02 sats 93% but ended at 94%   Although she has emphysema on CT her pattern is more of an AB pt who struggles with devices/ names of meds so rec keep it simple for now by adding budesonide  0.25 mg twice daily to the duoneb which she is already using qid and offer short term relief with Prednisone 10 mg take  4 each am x 2 days,   2 each am x 2 days,  1 each am x 2 days and stop   Optimal rx would be bud/ performist and yupelri if affordable on her plan p returns for f/u pfts and returns with all meds in hand using a trust but verify approach to confirm accurate Medication  Reconciliation The principal here is that until we are certain that the  patients are doing what we've asked, it makes no sense to ask them to do more.   Discussed in detail all the  indications, usual  risks and alternatives  relative to the benefits with patient who agrees to proceed with Rx as outlined.      Each maintenance medication was reviewed in detail including emphasizing most importantly the difference between maintenance and prns and  under what circumstances the prns are to be triggered using an action plan format where appropriate.  Total time for H and P, chart review, counseling, reviewing hfa/ neb device(s) , directly observing portions of ambulatory 02 saturation study/ and generating customized AVS unique to this office visit / same day charting = 45 min new pt eval                 Cigarette smoker 4-5 min discussion re active cigarette smoking in addition to office E&M  Ask about tobacco use:   ongoing Advise quitting   direct relationship to CB/AB explained since this is her main symptom today  Assess willingness:  Not committed at this point Assist in quit attempt:  Per PCP when ready? Responded to chantix starter pak but did not stay on it? (She isn't sure what the name of the med was but "it workedScience writer follow up:   Follow up per Primary Care planned   Low-dose CT lung cancer screening is recommended for patients who are 27-49 years of age with a 20+ pack-year history of smoking and who are currently smoking or quit <=15 years ago. No coughing up blood  No unintentional weight loss of > 15 pounds in the last 6 months - pt is eligible for scanning yearly until age 110 > referred today         Vernestine Gondola, MD 11/28/2023

## 2023-11-28 ENCOUNTER — Ambulatory Visit: Admitting: Internal Medicine

## 2023-11-28 ENCOUNTER — Encounter: Payer: Self-pay | Admitting: Internal Medicine

## 2023-11-28 VITALS — BP 99/67 | HR 92 | Ht 62.0 in | Wt 139.0 lb

## 2023-11-28 DIAGNOSIS — F17210 Nicotine dependence, cigarettes, uncomplicated: Secondary | ICD-10-CM | POA: Diagnosis not present

## 2023-11-28 DIAGNOSIS — J449 Chronic obstructive pulmonary disease, unspecified: Secondary | ICD-10-CM | POA: Diagnosis not present

## 2023-11-28 MED ORDER — PREDNISONE 10 MG PO TABS: ORAL_TABLET | ORAL | 0 refills | Status: DC

## 2023-11-28 MED ORDER — BUDESONIDE 0.25 MG/2ML IN SUSP: 0.2500 mg | Freq: Two times a day (BID) | RESPIRATORY_TRACT | 12 refills | Status: DC

## 2023-11-28 NOTE — Assessment & Plan Note (Addendum)
 Active smoker - onset of symptoms of doe/ cough x 2021 - LDSCT 05/06/22  very mild centrilobular and paraseptal emphysema  - 11/28/2023  After extensive coaching inhaler device,  effectiveness =    25%  - 11/28/2023 added bud 0.25 bid to duoneb  - Allergy screen 11/28/2023 >  Eos 0. /  IgE  and alpha one Phenotype  - 11/28/2023   Walked on RA  x  3  lap(s) =  approx 450  ft  @ mod pace, stopped due to back pain with lowest 02 sats 93% but ended at 94%   Although she has emphysema on CT her pattern is more of an AB pt who struggles with devices/ names of meds so rec keep it simple for now by adding budesonide  0.25 mg twice daily to the duoneb which she is already using qid and offer short term relief with Prednisone 10 mg take  4 each am x 2 days,   2 each am x 2 days,  1 each am x 2 days and stop   Optimal rx would be bud/ performist and yupelri if affordable on her plan p returns for f/u pfts and returns with all meds in hand using a trust but verify approach to confirm accurate Medication  Reconciliation The principal here is that until we are certain that the  patients are doing what we've asked, it makes no sense to ask them to do more.   Discussed in detail all the  indications, usual  risks and alternatives  relative to the benefits with patient who agrees to proceed with Rx as outlined.      Each maintenance medication was reviewed in detail including emphasizing most importantly the difference between maintenance and prns and under what circumstances the prns are to be triggered using an action plan format where appropriate.  Total time for H and P, chart review, counseling, reviewing hfa/ neb device(s) , directly observing portions of ambulatory 02 saturation study/ and generating customized AVS unique to this office visit / same day charting = 45 min new pt eval

## 2023-11-28 NOTE — Assessment & Plan Note (Addendum)
 4-5 min discussion re active cigarette smoking in addition to office E&M  Ask about tobacco use:   ongoing Advise quitting   direct relationship to CB/AB explained since this is her main symptom today  Assess willingness:  Not committed at this point Assist in quit attempt:  Per PCP when ready? Responded to chantix starter pak but did not stay on it? (She isn't sure what the name of the med was but "it workedScience writer follow up:   Follow up per Primary Care planned   Low-dose CT lung cancer screening is recommended for patients who are 89-10 years of age with a 20+ pack-year history of smoking and who are currently smoking or quit <=15 years ago. No coughing up blood  No unintentional weight loss of > 15 pounds in the last 6 months - pt is eligible for scanning yearly until age 37 > referred today

## 2023-11-28 NOTE — Patient Instructions (Addendum)
 My office will be contacting you by phone for referral to lung cancer screening   [phone]) - if you don't hear back from my office within one week,  please call us  back or notify us  thru MyChart and we'll address it right away.    Add budesonide  0.25 mg  with the AM duoneb and the PM duoneb (supper time)  Time your activities at least 15 min after your nebulizer   For cough congestion > Mucinex dm 1200 mg every 12 hours as needed   The key is to stop smoking completely before smoking completely stops you!  Prednisone 10 mg take  4 each am x 2 days,   2 each am x 2 days,  1 each am x 2 days and stop    PFTs prior to  return  Please schedule a follow up visit in 3 months but call sooner if needed  with all medications /inhalers/ solutions in hand so we can verify exactly what you are taking. This includes all medications from all doctors and over the counters

## 2023-12-02 LAB — CBC WITH DIFFERENTIAL/PLATELET
Basophils Absolute: 0 10*3/uL (ref 0.0–0.2)
Basos: 0 %
EOS (ABSOLUTE): 0.2 10*3/uL (ref 0.0–0.4)
Eos: 2 %
Hematocrit: 47.4 % — ABNORMAL HIGH (ref 34.0–46.6)
Hemoglobin: 15.5 g/dL (ref 11.1–15.9)
Immature Grans (Abs): 0 10*3/uL (ref 0.0–0.1)
Immature Granulocytes: 0 %
Lymphocytes Absolute: 3.3 10*3/uL — ABNORMAL HIGH (ref 0.7–3.1)
Lymphs: 36 %
MCH: 30.2 pg (ref 26.6–33.0)
MCHC: 32.7 g/dL (ref 31.5–35.7)
MCV: 92 fL (ref 79–97)
Monocytes Absolute: 0.8 10*3/uL (ref 0.1–0.9)
Monocytes: 9 %
Neutrophils Absolute: 4.8 10*3/uL (ref 1.4–7.0)
Neutrophils: 53 %
Platelets: 225 10*3/uL (ref 150–450)
RBC: 5.14 x10E6/uL (ref 3.77–5.28)
RDW: 12 % (ref 11.7–15.4)
WBC: 9.1 10*3/uL (ref 3.4–10.8)

## 2023-12-02 LAB — ALPHA-1-ANTITRYPSIN PHENOTYP: A-1 Antitrypsin: 156 mg/dL (ref 101–187)

## 2023-12-02 LAB — IGE: IgE (Immunoglobulin E), Serum: 10 [IU]/mL (ref 6–495)

## 2023-12-12 ENCOUNTER — Telehealth: Payer: Self-pay

## 2023-12-12 DIAGNOSIS — F17210 Nicotine dependence, cigarettes, uncomplicated: Secondary | ICD-10-CM

## 2023-12-12 DIAGNOSIS — J449 Chronic obstructive pulmonary disease, unspecified: Secondary | ICD-10-CM

## 2023-12-12 NOTE — Telephone Encounter (Signed)
 Add her on this week with cxr

## 2023-12-12 NOTE — Telephone Encounter (Signed)
 Copied from CRM [id number]. Topic: General - Other >> Dec 12, 2023  3:33 PM Eveleen Hinds B wrote: Reason for CRM: Patient stated she received a call from office. I verified upcoming pulm visits in Aug. Patient would like a call back from office. Stated it was regarding an xray.  Pt states [place] wanted the pt to have a f/u xray from a procedure she had done. Pt wanted confirmation from Dr Waymond Hailey if this was needed.

## 2023-12-13 ENCOUNTER — Ambulatory Visit (INDEPENDENT_AMBULATORY_CARE_PROVIDER_SITE_OTHER)

## 2023-12-13 ENCOUNTER — Ambulatory Visit

## 2023-12-13 ENCOUNTER — Encounter: Payer: Self-pay | Admitting: Internal Medicine

## 2023-12-13 ENCOUNTER — Ambulatory Visit (INDEPENDENT_AMBULATORY_CARE_PROVIDER_SITE_OTHER): Admitting: Internal Medicine

## 2023-12-13 VITALS — BP 104/63 | HR 94 | Temp 96.9°F | Ht 62.0 in | Wt 139.0 lb

## 2023-12-13 DIAGNOSIS — R0602 Shortness of breath: Secondary | ICD-10-CM

## 2023-12-13 DIAGNOSIS — J449 Chronic obstructive pulmonary disease, unspecified: Secondary | ICD-10-CM

## 2023-12-13 DIAGNOSIS — R059 Cough, unspecified: Secondary | ICD-10-CM

## 2023-12-13 DIAGNOSIS — F17210 Nicotine dependence, cigarettes, uncomplicated: Secondary | ICD-10-CM

## 2023-12-13 NOTE — Patient Instructions (Addendum)
 My office will be contacting you by phone for referral to lung cancer screening   [phone]) - if you don't hear back from my office within one week,  please call us  back or notify us  thru MyChart and we'll address it right away.   The key is to stop smoking completely before smoking completely stops you!    See your eye doctor or Dr Merlyn Starring at [facility]   Keep previous appt.

## 2023-12-13 NOTE — Telephone Encounter (Signed)
 Appt scheduled for today and cxr ordered  Pt aware it's the [address] location that she is scheduled for  Nothing further needed

## 2023-12-13 NOTE — Progress Notes (Addendum)
 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   Add budesonide  0.25 mg  with the AM duoneb and the PM duoneb (supper time) Time your activities at least 15 min after your nebulizer  For cough congestion > Mucinex dm 1200 mg every 12 hours as needed  The key is to stop smoking completely before smoking completely stops you! Prednisone  10 mg take  4 each am x 2 days,   2 each am x 2 days,  1 each am x 2 days and stop PFTs prior to  return Please schedule a follow up visit in 3 months but call sooner if needed  with all medications /inhalers/ solutions in hand      12/13/2023  f/u ov/Audrionna Lampton re: doe/ cough    maint on DUONEB QID   DID NOT BRING meds as requested / no pfts yet  Chief Complaint  Patient presents with   Follow-up    F/u on cxr   Dyspnea:  IMPROVING  Cough: NONE  Sleeping: ARM OF COUCH  x 2.5 years s resp cc  SABA use: none  02: none   Lung cancer screening :  not since 05/06/22  = RADS 2 c/w mild centrilobular  emphysema > referred back 12/13/2023   No obvious day to day or daytime variability or assoc excess/ purulent sputum or mucus plugs or hemoptysis or cp or chest tightness, subjective wheeze or overt sinus or hb symptoms.    Also denies any obvious fluctuation of symptoms with weather or environmental changes or other aggravating or alleviating factors except as outlined above   No unusual exposure hx or h/o childhood pna/ asthma or knowledge of premature birth.  Current Allergies, Complete Past Medical History, Past Surgical History, Family History, and Social History were reviewed in [employer] record.  ROS  The following are not active complaints unless bolded Hoarseness, sore throat, dysphagia, dental problems, itching eyes, sneezing,  nasal congestion or discharge of excess mucus or purulent secretions, ear ache,   fever, chills, sweats, unintended wt loss or wt gain, classically pleuritic or exertional cp,  orthopnea pnd or arm/hand swelling  or leg swelling, presyncope, palpitations, abdominal pain, anorexia, nausea, vomiting, diarrhea  or change in bowel habits or change in bladder habits, change in stools or change in urine, dysuria,  hematuria,  rash, arthralgias, visual complaints, headache, numbness, weakness or ataxia or problems with walking or coordination,  change in mood or  memory.        Current Meds  Medication Sig   acetaminophen (TYLENOL) 650 MG CR tablet Take 650 mg by mouth every 8 (eight) hours as needed for pain.   albuterol (VENTOLIN HFA) 108 (90 Base) MCG/ACT inhaler Inhale 1-2 puffs into the lungs every 6 (six) hours as needed for shortness of breath or wheezing.   bismuth  subsalicylate (STOMACH RELIEF) 262 MG chewable tablet chew 3 TABLETS BY MOUTH THREE TIMES DAILY   budesonide  (PULMICORT ) 0.25 MG/2ML nebulizer solution Take 2 mLs (0.25 mg total) by nebulization 2 (two) times daily.   Calcium Carb-Cholecalciferol (CALCIUM 600+D3 PO) Take 2  tablets by mouth in the morning.   Camphor-Menthol-Methyl Sal (SALONPAS) 3.08-06-08 % PTCH Apply 1 Application topically daily as needed (pain).   cycloSPORINE (RESTASIS) 0.05 % ophthalmic emulsion Place 1 drop into both eyes in the morning, at noon, and at bedtime.   diclofenac Sodium (VOLTAREN) 1 % GEL Apply 2 g topically daily as needed (pain).   dicyclomine  (BENTYL ) 10 MG capsule TAKE 1 CAPSULE BY MOUTH EVERY MORNING, TAKE 1 CAPSULE AT NOON, TAKE 1 CAPSULE EACH EVENING AND TAKE 1 CAPSULE AT BEDTIME   fluticasone (FLONASE) 50 MCG/ACT nasal spray Place 2 sprays into both nostrils in the morning.   HYDROcodone bit-homatropine (HYDROMET) 5-1.5 MG/5ML syrup Take 5 mL every 4-6 hours by oral route as needed.   hydrocortisone  (ANUSOL -HC) 2.5 % rectal cream APPLY 1 APPLICATION RECTALLY TWICE DAILY. TO RECTUM FOR HEMORRHOID DISCOMFORT OR BLEEDING   hydrOXYzine (VISTARIL) 25 MG capsule Take 25 mg by mouth daily as needed for itching.   ipratropium-albuterol (DUONEB) 0.5-2.5 (3) MG/3ML SOLN Inhale 3 mLs into the lungs 2 (two) times daily.   ketoconazole  (NIZORAL ) 2 % cream APPLY 1 APPLICATION TOPICALLY TWICE DAILY BETWEEN BUTTOCKS FOLDS FOR YEAST *REFILL REQUEST*   levothyroxine (SYNTHROID) 75 MCG tablet Take 75 mcg by mouth daily.   methocarbamol (ROBAXIN) 500 MG tablet Take 500 mg by mouth daily as needed for muscle spasms.   mirabegron ER (MYRBETRIQ) 25 MG TB24 tablet Take 25 mg by mouth at bedtime.   ondansetron  (ZOFRAN -ODT) 4 MG disintegrating tablet Take 1 tablet (4 mg total) by mouth every 6 (six) hours.   Potassium Chloride  ER 20 MEQ TBCR Take 1 tablet by mouth 2 (two) times daily with a meal.   predniSONE  (DELTASONE ) 10 MG tablet Take  4 each am x 2 days,   2 each am x 2 days,  1 each am x 2 days and stop   PROLIA 60 MG/ML SOSY injection Inject 60 mg into the skin every 6 (six) months.   simvastatin (ZOCOR) 40 MG tablet Take 40 mg by mouth at bedtime.   sodium chloride  (OCEAN) 0.65 % SOLN nasal  spray Place 1 spray into both nostrils as needed for congestion.        Past Medical History:  Diagnosis Date   Complication of anesthesia    difficulty waking up after hysterectomy   COPD (chronic obstructive pulmonary disease) (HCC)    Fatty liver    GERD (gastroesophageal reflux disease)    History of colon polyps    remote past, unable to retrieve records    Hyperlipidemia    Hypertension    Hypothyroidism    Lymphocytic colitis    Rheumatoid arthritis (HCC)       Objective:  Wt Readings from Last 3 Encounters:  12/13/23 139 lb (63 kg)  11/28/23 139 lb (63 kg)  08/22/23 133 lb 3.2 oz (60.4 kg)      Vital signs reviewed  12/13/2023  - Note at rest 02 sats  95% on ra   General appearance:    AMB WF/RED EYES  / grossly nl pressures/ no purulent secretions     HEENT : Oropharynx  clear   Nasal turbinates mod edema    NECK :  without  apparent JVD/ palpable Nodes/TM    LUNGS: no acc muscle use,  Mild barrel  contour chest wall with bilateral  Distant bs s audible wheeze and  without cough on insp or exp maneuvers  and mild  Hyperresonant  to  percussion bilaterally     CV:  RRR  no s3 or murmur or increase in P2, and no edema   ABD:  soft and nontender with pos end  insp Hoover's  in the supine position.  No bruits or organomegaly appreciated   MS:  Nl gait/ ext warm without deformities Or obvious joint restrictions  calf tenderness, cyanosis or clubbing     SKIN: warm and dry without lesions    NEURO:  alert, approp, nl sensorium with  no motor or cerebellar deficits apparent.        Assessment

## 2023-12-14 ENCOUNTER — Ambulatory Visit: Payer: Self-pay | Admitting: Internal Medicine

## 2023-12-16 NOTE — Assessment & Plan Note (Signed)
 Active smokerMM - onset of symptoms of doe/ cough x 2021 - LDSCT 05/06/22  very mild centrilobular and paraseptal emphysema  - 11/28/2023  After extensive coaching inhaler device,  effectiveness =    25%  - 11/28/2023 added bud 0.25 bid to duoneb  - Allergy screen 11/28/2023 >  Eos 0. 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  Discussed in detail all the  indications, usual  risks and alternatives  relative to the benefits with patient who agrees to proceed with Rx as outlined.      F/u at regularly schedules ov/ sooner prn

## 2023-12-16 NOTE — Assessment & Plan Note (Addendum)
 The key is to stop smoking completely before smoking completely stops you!  Low-dose CT lung cancer screening is recommended for patients who are 85-77 years of age with a 20+ pack-year history of smoking and who are currently smoking or quit <=15 years ago. No coughing up blood  No unintentional weight loss of > 15 pounds in the last 6 months - pt is eligible for scanning yearly until age 30 > referred back to program      Each maintenance medication was reviewed in detail including emphasizing most importantly the difference between maintenance and prns and under what circumstances the prns are to be triggered using an action plan format where appropriate.  Total time for H and P, chart review, counseling, reviewing neb  device(s) and generating customized AVS unique to this office visit / same day charting = 24 min

## 2023-12-22 ENCOUNTER — Telehealth: Payer: Self-pay | Admitting: Acute Care

## 2023-12-22 DIAGNOSIS — F17210 Nicotine dependence, cigarettes, uncomplicated: Secondary | ICD-10-CM

## 2023-12-22 DIAGNOSIS — Z122 Encounter for screening for malignant neoplasm of respiratory organs: Secondary | ICD-10-CM

## 2023-12-22 DIAGNOSIS — Z87891 Personal history of nicotine dependence: Secondary | ICD-10-CM

## 2023-12-22 NOTE — Telephone Encounter (Signed)
 Lung Cancer Screening Narrative/Criteria Questionnaire (Cigarette Smokers Only- No Cigars/Pipes/vapes)   Jennifer [name]   SDMV:01/11/2024 12:00 Natalie        1946-02-20   LDCT: 01/12/2024 7:00a Jennifer [name]    78 y.o.   Phone: [phone]  Lung Screening Narrative (confirm age 5-77 yrs Medicare / 50-80 yrs Private pay insurance)   Insurance information:[employer]   Referring Provider:Dr. Waymond Hailey   This screening involves an initial phone call with a team member from our program. It is called a shared decision making visit. The initial meeting is required by  insurance and Medicare to make sure you understand the program. This appointment takes about 15-20 minutes to complete. You will complete the screening scan at your scheduled date/time.  This scan takes about 5-10 minutes to complete. You can eat and drink normally before and after the scan.  Criteria questions for Lung Cancer Screening:   Are you a current or former smoker? Current Age began smoking: 78 yo   If you are a former smoker, what year did you quit smoking? Quit for 7 years then started again(within 15 yrs)   To calculate your smoking history, I need an accurate estimate of how many packs of cigarettes you smoked per day and for how many years. (Not just the number of PPD you are now smoking)   Years smoking 54 x Packs per day 1/2 = Pack years 27   (at least 20 pack yrs)   (Make sure they understand that we need to know how much they have smoked in the past, not just the number of PPD they are smoking now)  Do you have a personal history of cancer?  No    Do you have a family history of cancer? No  Are you coughing up blood?  No  Have you had unexplained weight loss of 15 lbs or more in the last 6 months? No  It looks like you meet all criteria.  When would be a good time for us  to schedule you for this screening?   Additional information: N/A

## 2024-01-01 DIAGNOSIS — R7303 Prediabetes: Secondary | ICD-10-CM | POA: Diagnosis not present

## 2024-01-01 DIAGNOSIS — Z0000 Encounter for general adult medical examination without abnormal findings: Secondary | ICD-10-CM | POA: Diagnosis not present

## 2024-01-01 DIAGNOSIS — E039 Hypothyroidism, unspecified: Secondary | ICD-10-CM | POA: Diagnosis not present

## 2024-01-01 DIAGNOSIS — M810 Age-related osteoporosis without current pathological fracture: Secondary | ICD-10-CM | POA: Diagnosis not present

## 2024-01-04 DIAGNOSIS — J449 Chronic obstructive pulmonary disease, unspecified: Secondary | ICD-10-CM | POA: Diagnosis not present

## 2024-01-04 DIAGNOSIS — Z515 Encounter for palliative care: Secondary | ICD-10-CM | POA: Diagnosis not present

## 2024-01-10 DIAGNOSIS — M5440 Lumbago with sciatica, unspecified side: Secondary | ICD-10-CM | POA: Diagnosis not present

## 2024-01-10 DIAGNOSIS — R7303 Prediabetes: Secondary | ICD-10-CM | POA: Diagnosis not present

## 2024-01-10 DIAGNOSIS — F17210 Nicotine dependence, cigarettes, uncomplicated: Secondary | ICD-10-CM | POA: Diagnosis not present

## 2024-01-10 DIAGNOSIS — K7689 Other specified diseases of liver: Secondary | ICD-10-CM | POA: Diagnosis not present

## 2024-01-10 DIAGNOSIS — R600 Localized edema: Secondary | ICD-10-CM | POA: Diagnosis not present

## 2024-01-10 DIAGNOSIS — R778 Other specified abnormalities of plasma proteins: Secondary | ICD-10-CM | POA: Diagnosis not present

## 2024-01-10 DIAGNOSIS — G8929 Other chronic pain: Secondary | ICD-10-CM | POA: Diagnosis not present

## 2024-01-10 DIAGNOSIS — Z0001 Encounter for general adult medical examination with abnormal findings: Secondary | ICD-10-CM | POA: Diagnosis not present

## 2024-01-10 DIAGNOSIS — J449 Chronic obstructive pulmonary disease, unspecified: Secondary | ICD-10-CM | POA: Diagnosis not present

## 2024-01-10 DIAGNOSIS — E782 Mixed hyperlipidemia: Secondary | ICD-10-CM | POA: Diagnosis not present

## 2024-01-10 DIAGNOSIS — E039 Hypothyroidism, unspecified: Secondary | ICD-10-CM | POA: Diagnosis not present

## 2024-01-10 DIAGNOSIS — M810 Age-related osteoporosis without current pathological fracture: Secondary | ICD-10-CM | POA: Diagnosis not present

## 2024-01-11 ENCOUNTER — Encounter: Payer: Self-pay | Admitting: *Deleted

## 2024-01-11 ENCOUNTER — Ambulatory Visit: Admitting: *Deleted

## 2024-01-11 DIAGNOSIS — F17210 Nicotine dependence, cigarettes, uncomplicated: Secondary | ICD-10-CM

## 2024-01-11 NOTE — Progress Notes (Signed)
  Virtual Visit via Telephone Note  I connected with Olivia Bevel on 01/11/24 at 12:00 PM EDT by telephone and verified that I am speaking with the correct person using two identifiers.  Location: Patient: Jennifer [name] Provider: Alyse Bach, RN   I discussed the limitations, risks, security and privacy concerns of performing an evaluation and management service by telephone and the availability of in person appointments. I also discussed with the patient that there may be a patient responsible charge related to this service. The patient expressed understanding and agreed to proceed.   Shared Decision Making Visit Lung Cancer Screening Program [phone])   Eligibility: Age 78 y.o. Pack Years Smoking History Calculation 27 (# packs/per year x # years smoked) Recent History of coughing up blood  no Unexplained weight loss? no ( >Than 15 pounds within the last 6 months ) Prior History Lung / other cancer no (Diagnosis within the last 5 years already requiring surveillance chest CT Scans). Smoking Status Current Smoker Former Smokers: Years since quit: n/a  Quit Date: n/a  Visit Components: Discussion included one or more decision making aids. yes Discussion included risk/benefits of screening. yes Discussion included potential follow up diagnostic testing for abnormal scans. yes Discussion included meaning and risk of over diagnosis. yes Discussion included meaning and risk of False Positives. yes Discussion included meaning of total radiation exposure. yes  Counseling Included: Importance of adherence to annual lung cancer LDCT screening. yes Impact of comorbidities on ability to participate in the program. yes Ability and willingness to under diagnostic treatment. yes  Smoking Cessation Counseling: Current Smokers:  Discussed importance of smoking cessation. yes Information about tobacco cessation classes and interventions provided to patient. yes Patient provided with  ticket for LDCT Scan. no Symptomatic Patient. no  Counseling(Intermediate counseling: > three minutes) 99406 Diagnosis Code: Tobacco Use Z72.0 Asymptomatic Patient yes  Counseling (Intermediate counseling: > three minutes counseling) U0454 Former Smokers:  Discussed the importance of maintaining cigarette abstinence. yes Diagnosis Code: Personal History of Nicotine Dependence. U98.119 Information about tobacco cessation classes and interventions provided to patient. Yes Patient provided with ticket for LDCT Scan. no Written Order for Lung Cancer Screening with LDCT placed in Epic. Yes (CT Chest Lung Cancer Screening Low Dose W/O CM) [id number] Z12.2-Screening of respiratory organs Z87.891-Personal history of nicotine dependence   Alyse Bach, RN

## 2024-01-11 NOTE — Patient Instructions (Signed)

## 2024-01-12 ENCOUNTER — Ambulatory Visit (HOSPITAL_COMMUNITY)
Admission: RE | Admit: 2024-01-12 | Discharge: 2024-01-12 | Disposition: A | Discharge status: Home / Self Care | Source: Ambulatory Visit | Attending: Acute Care | Admitting: Acute Care

## 2024-01-12 DIAGNOSIS — Z87891 Personal history of nicotine dependence: Secondary | ICD-10-CM | POA: Diagnosis present

## 2024-01-12 DIAGNOSIS — Z122 Encounter for screening for malignant neoplasm of respiratory organs: Secondary | ICD-10-CM | POA: Diagnosis present

## 2024-01-12 DIAGNOSIS — F17210 Nicotine dependence, cigarettes, uncomplicated: Secondary | ICD-10-CM | POA: Insufficient documentation

## 2024-01-19 DIAGNOSIS — M545 Low back pain, unspecified: Secondary | ICD-10-CM | POA: Diagnosis not present

## 2024-01-29 DIAGNOSIS — H02831 Dermatochalasis of right upper eyelid: Secondary | ICD-10-CM | POA: Diagnosis not present

## 2024-01-29 DIAGNOSIS — H02834 Dermatochalasis of left upper eyelid: Secondary | ICD-10-CM | POA: Diagnosis not present

## 2024-01-29 DIAGNOSIS — H02035 Senile entropion of left lower eyelid: Secondary | ICD-10-CM | POA: Diagnosis not present

## 2024-01-29 DIAGNOSIS — H01001 Unspecified blepharitis right upper eyelid: Secondary | ICD-10-CM | POA: Diagnosis not present

## 2024-01-31 DIAGNOSIS — E039 Hypothyroidism, unspecified: Secondary | ICD-10-CM | POA: Diagnosis not present

## 2024-01-31 DIAGNOSIS — M545 Low back pain, unspecified: Secondary | ICD-10-CM | POA: Diagnosis not present

## 2024-01-31 DIAGNOSIS — F17210 Nicotine dependence, cigarettes, uncomplicated: Secondary | ICD-10-CM | POA: Diagnosis not present

## 2024-01-31 DIAGNOSIS — G8929 Other chronic pain: Secondary | ICD-10-CM | POA: Diagnosis not present

## 2024-01-31 DIAGNOSIS — R062 Wheezing: Secondary | ICD-10-CM | POA: Diagnosis not present

## 2024-01-31 DIAGNOSIS — Z79890 Hormone replacement therapy (postmenopausal): Secondary | ICD-10-CM | POA: Diagnosis not present

## 2024-01-31 DIAGNOSIS — Z79899 Other long term (current) drug therapy: Secondary | ICD-10-CM | POA: Diagnosis not present

## 2024-01-31 DIAGNOSIS — F17200 Nicotine dependence, unspecified, uncomplicated: Secondary | ICD-10-CM | POA: Diagnosis not present

## 2024-01-31 DIAGNOSIS — J449 Chronic obstructive pulmonary disease, unspecified: Secondary | ICD-10-CM | POA: Diagnosis not present

## 2024-01-31 DIAGNOSIS — R0609 Other forms of dyspnea: Secondary | ICD-10-CM | POA: Diagnosis not present

## 2024-02-08 ENCOUNTER — Encounter (INDEPENDENT_AMBULATORY_CARE_PROVIDER_SITE_OTHER): Payer: Self-pay | Admitting: *Deleted

## 2024-02-11 DIAGNOSIS — M545 Low back pain, unspecified: Secondary | ICD-10-CM | POA: Diagnosis not present

## 2024-02-19 DIAGNOSIS — J449 Chronic obstructive pulmonary disease, unspecified: Secondary | ICD-10-CM | POA: Diagnosis not present

## 2024-02-19 DIAGNOSIS — Z515 Encounter for palliative care: Secondary | ICD-10-CM | POA: Diagnosis not present

## 2024-02-21 DIAGNOSIS — E039 Hypothyroidism, unspecified: Secondary | ICD-10-CM | POA: Diagnosis not present

## 2024-02-26 DIAGNOSIS — M5416 Radiculopathy, lumbar region: Secondary | ICD-10-CM | POA: Diagnosis not present

## 2024-02-26 DIAGNOSIS — M47816 Spondylosis without myelopathy or radiculopathy, lumbar region: Secondary | ICD-10-CM | POA: Diagnosis not present

## 2024-02-28 DIAGNOSIS — M5416 Radiculopathy, lumbar region: Secondary | ICD-10-CM | POA: Diagnosis not present

## 2024-03-04 DIAGNOSIS — H02035 Senile entropion of left lower eyelid: Secondary | ICD-10-CM | POA: Diagnosis not present

## 2024-03-07 ENCOUNTER — Ambulatory Visit: Admitting: Adult Health

## 2024-03-07 ENCOUNTER — Encounter (HOSPITAL_COMMUNITY)

## 2024-03-08 DIAGNOSIS — B370 Candidal stomatitis: Secondary | ICD-10-CM | POA: Diagnosis not present

## 2024-03-08 DIAGNOSIS — B9689 Other specified bacterial agents as the cause of diseases classified elsewhere: Secondary | ICD-10-CM | POA: Diagnosis not present

## 2024-03-08 DIAGNOSIS — J019 Acute sinusitis, unspecified: Secondary | ICD-10-CM | POA: Diagnosis not present

## 2024-03-10 NOTE — Progress Notes (Deleted)
 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   Add budesonide  0.25 mg  with the AM duoneb and the PM duoneb (supper time) Time your activities at least 15 min after your nebulizer  For cough congestion > Mucinex dm 1200 mg every 12 hours as needed  The key is to stop smoking completely before smoking completely stops you! Prednisone  10 mg take  4 each am x 2 days,   2 each am x 2 days,  1 each am x 2 days and stop PFTs prior to  return Please schedule a follow up visit in 3 months but call sooner if needed  with all medications /inhalers/ solutions in hand      12/13/2023  f/u ov/Jennifer [name] re: doe/ cough    maint on DUONEB QID   DID NOT BRING meds as requested / no pfts yet  Chief Complaint  Patient presents with   Follow-up    F/u on cxr   Dyspnea:  IMPROVING  Cough: NONE  Sleeping: ARM OF COUCH  x 2.5 years s resp cc  SABA use: none  02: none   Lung cancer screening :  not since 05/06/22  = RADS 2 c/w mild centrilobular  emphysema > referred back 12/13/2023   No obvious day to day or daytime variability or assoc excess/ purulent sputum or mucus plugs or hemoptysis or cp or chest tightness, subjective wheeze or overt sinus or hb symptoms.    Also denies any obvious fluctuation of symptoms with weather or environmental changes or other aggravating or alleviating factors except as outlined above   No unusual exposure hx or h/o childhood pna/ asthma or knowledge of premature birth.  Current Allergies, Complete Past Medical History, Past Surgical History, Family History, and Social History were reviewed in [employer] record.  ROS  The following are not active complaints unless bolded Hoarseness, sore throat, dysphagia, dental problems, itching eyes, sneezing,  nasal congestion or discharge of excess mucus or purulent secretions, ear ache,   fever, chills, sweats, unintended wt loss or wt gain, classically pleuritic or exertional cp,  orthopnea pnd or arm/hand swelling  or leg swelling, presyncope, palpitations, abdominal pain, anorexia, nausea, vomiting, diarrhea  or change in bowel habits or change in bladder habits, change in stools or change in urine, dysuria,  hematuria,  rash, arthralgias, visual complaints, headache, numbness, weakness or ataxia or problems with walking or coordination,  change in mood or  memory.        Current Meds  Medication Sig   acetaminophen (TYLENOL) 650 MG CR tablet Take 650 mg by mouth every 8 (eight) hours as needed for pain.   albuterol (VENTOLIN HFA) 108 (90 Base) MCG/ACT inhaler Inhale 1-2 puffs into the lungs every 6 (six) hours as needed for shortness of breath or wheezing.   bismuth  subsalicylate (STOMACH RELIEF) 262 MG chewable tablet chew 3 TABLETS BY MOUTH THREE TIMES DAILY   budesonide  (PULMICORT ) 0.25 MG/2ML nebulizer solution Take 2 mLs (0.25 mg total) by nebulization 2 (two) times daily.   Calcium Carb-Cholecalciferol (CALCIUM 600+D3 PO) Take 2  tablets by mouth in the morning.   Camphor-Menthol-Methyl Sal (SALONPAS) 3.08-06-08 % PTCH Apply 1 Application topically daily as needed (pain).   cycloSPORINE (RESTASIS) 0.05 % ophthalmic emulsion Place 1 drop into both eyes in the morning, at noon, and at bedtime.   diclofenac Sodium (VOLTAREN) 1 % GEL Apply 2 g topically daily as needed (pain).   dicyclomine  (BENTYL ) 10 MG capsule TAKE 1 CAPSULE BY MOUTH EVERY MORNING, TAKE 1 CAPSULE AT NOON, TAKE 1 CAPSULE EACH EVENING AND TAKE 1 CAPSULE AT BEDTIME   fluticasone (FLONASE) 50 MCG/ACT nasal spray Place 2 sprays into both nostrils in the morning.   HYDROcodone bit-homatropine (HYDROMET) 5-1.5 MG/5ML syrup Take 5 mL every 4-6 hours by oral route as needed.   hydrocortisone  (ANUSOL -HC) 2.5 % rectal cream APPLY 1 APPLICATION RECTALLY TWICE DAILY. TO RECTUM FOR HEMORRHOID DISCOMFORT OR BLEEDING   hydrOXYzine (VISTARIL) 25 MG capsule Take 25 mg by mouth daily as needed for itching.   ipratropium-albuterol (DUONEB) 0.5-2.5 (3) MG/3ML SOLN Inhale 3 mLs into the lungs 2 (two) times daily.   ketoconazole  (NIZORAL ) 2 % cream APPLY 1 APPLICATION TOPICALLY TWICE DAILY BETWEEN BUTTOCKS FOLDS FOR YEAST *REFILL REQUEST*   levothyroxine (SYNTHROID) 75 MCG tablet Take 75 mcg by mouth daily.   methocarbamol (ROBAXIN) 500 MG tablet Take 500 mg by mouth daily as needed for muscle spasms.   mirabegron ER (MYRBETRIQ) 25 MG TB24 tablet Take 25 mg by mouth at bedtime.   ondansetron  (ZOFRAN -ODT) 4 MG disintegrating tablet Take 1 tablet (4 mg total) by mouth every 6 (six) hours.   Potassium Chloride  ER 20 MEQ TBCR Take 1 tablet by mouth 2 (two) times daily with a meal.   predniSONE  (DELTASONE ) 10 MG tablet Take  4 each am x 2 days,   2 each am x 2 days,  1 each am x 2 days and stop   PROLIA 60 MG/ML SOSY injection Inject 60 mg into the skin every 6 (six) months.   simvastatin (ZOCOR) 40 MG tablet Take 40 mg by mouth at bedtime.   sodium chloride  (OCEAN) 0.65 % SOLN nasal  spray Place 1 spray into both nostrils as needed for congestion.        Past Medical History:  Diagnosis Date   Complication of anesthesia    difficulty waking up after hysterectomy   COPD (chronic obstructive pulmonary disease) (HCC)    Fatty liver    GERD (gastroesophageal reflux disease)    History of colon polyps    remote past, unable to retrieve records    Hyperlipidemia    Hypertension    Hypothyroidism    Lymphocytic colitis    Rheumatoid arthritis (HCC)       Objective:  Wts  03/12/2024        ***  12/13/23 139 lb (63 kg)  11/28/23 139 lb (63 kg)  08/22/23 133 lb 3.2 oz (60.4 kg)    Vital signs reviewed  03/12/2024  - Note at rest 02 sats  ***% on ***   General appearance:    ***      AMB WF/RED EYES  ***   Mild bar***      Assessment

## 2024-03-11 ENCOUNTER — Telehealth: Payer: Self-pay | Admitting: Internal Medicine

## 2024-03-11 NOTE — Telephone Encounter (Signed)
 Copied from CRM #[id number]. Topic: Appointments - Scheduling Inquiry for Clinic >> Mar 11, 2024 11:14 AM Shona S wrote: Reason for CRM: patient would like to know if it is okay for her to still come to her appointments even though she has a sinus infection  Called patient and rescheduled PFT at [facility] and follow up with Dr. Darlean to 05/07/24---will mail information to patient and she voiced her understanding

## 2024-03-12 ENCOUNTER — Ambulatory Visit: Admitting: Internal Medicine

## 2024-03-12 ENCOUNTER — Ambulatory Visit (HOSPITAL_COMMUNITY)

## 2024-03-14 ENCOUNTER — Telehealth: Payer: Self-pay | Admitting: Internal Medicine

## 2024-03-14 DIAGNOSIS — M5416 Radiculopathy, lumbar region: Secondary | ICD-10-CM | POA: Diagnosis not present

## 2024-03-14 NOTE — Telephone Encounter (Signed)
 Called to speak with patient regarding the 05/07/24 1:30pm appointment with Dr. Darlean (provider not in office)---rescheduled to 05/09/24 at 2:30 pm---patient told to keep the PFT appointment at [facility] on 05/07/24.  Will mail new information to patient and she voiced her understanding

## 2024-03-15 ENCOUNTER — Ambulatory Visit: Attending: Cardiology | Admitting: Cardiology

## 2024-03-15 ENCOUNTER — Encounter: Payer: Self-pay | Admitting: Cardiology

## 2024-03-15 VITALS — BP 120/77 | HR 84 | Ht 64.0 in | Wt 148.0 lb

## 2024-03-15 DIAGNOSIS — R002 Palpitations: Secondary | ICD-10-CM

## 2024-03-15 DIAGNOSIS — E782 Mixed hyperlipidemia: Secondary | ICD-10-CM

## 2024-03-15 DIAGNOSIS — R600 Localized edema: Secondary | ICD-10-CM

## 2024-03-15 NOTE — Patient Instructions (Signed)
Medication Instructions:  Continue all current medications.  Labwork: none  Testing/Procedures: none  Follow-Up: As needed.    Any Other Special Instructions Will Be Listed Below (If Applicable).  If you need a refill on your cardiac medications before your next appointment, please call your pharmacy.  

## 2024-03-15 NOTE — Progress Notes (Signed)
 Clinical Summary Ms. Kohrs is a 78 y.o.female seen today for follow up of the following medical problems.    1. LE edema - comes and goes, ongoing x 10 years.  - seems to be worst  09/2016 echo LVEF 60-65%, no WMAs, grade I DDx 11/2020 echo: LVEF 60-65%, no WMAs, grade I dd, normal RV function  -from ntoes NP Huffman changed patient to lasix 03/2022. Torsemide causes leg cramps  - since last visit she has stopped lasix, had prn most recently but swelling had resolved and had not needed.     2. HLD  - 12/2023 TC 148 TG 84 HDL 56 LDL 76  Past Medical History:  Diagnosis Date   Complication of anesthesia    difficulty waking up after hysterectomy   COPD (chronic obstructive pulmonary disease) (HCC)    Fatty liver    GERD (gastroesophageal reflux disease)    History of colon polyps    remote past, unable to retrieve records    Hyperlipidemia    Hypertension    Hypothyroidism    Lymphocytic colitis    Rheumatoid arthritis (HCC)      Allergies  Allergen Reactions   Desyrel [Trazodone] Other (See Comments)    Body shaking    Keflex [Cephalexin] Other (See Comments)    (Unknown)   Klonopin [Clonazepam] Other (See Comments)    (Unknown)   Minocin [Minocycline Hcl] Other (See Comments)    (Unknown)   Stelazine [Trifluoperazine]     Mouth was crossing over    Viberzi  [Eluxadoline ]     abd pain, dries out eyes,      Current Outpatient Medications  Medication Sig Dispense Refill   acetaminophen (TYLENOL) 650 MG CR tablet Take 650 mg by mouth every 8 (eight) hours as needed for pain.     albuterol (VENTOLIN HFA) 108 (90 Base) MCG/ACT inhaler Inhale 1-2 puffs into the lungs every 6 (six) hours as needed for shortness of breath or wheezing.     bismuth  subsalicylate (STOMACH RELIEF) 262 MG chewable tablet chew 3 TABLETS BY MOUTH THREE TIMES DAILY 270 tablet 1   budesonide  (PULMICORT ) 0.25 MG/2ML nebulizer solution Take 2 mLs (0.25 mg total) by nebulization 2 (two) times  daily. 120 mL 12   Calcium Carb-Cholecalciferol (CALCIUM 600+D3 PO) Take 2 tablets by mouth in the morning.     Camphor-Menthol-Methyl Sal (SALONPAS) 3.08-06-08 % PTCH Apply 1 Application topically daily as needed (pain).     cycloSPORINE (RESTASIS) 0.05 % ophthalmic emulsion Place 1 drop into both eyes in the morning, at noon, and at bedtime.     diclofenac Sodium (VOLTAREN) 1 % GEL Apply 2 g topically daily as needed (pain).     dicyclomine  (BENTYL ) 10 MG capsule TAKE 1 CAPSULE BY MOUTH EVERY MORNING, TAKE 1 CAPSULE AT NOON, TAKE 1 CAPSULE EACH EVENING AND TAKE 1 CAPSULE AT BEDTIME 120 capsule 10   fluticasone (FLONASE) 50 MCG/ACT nasal spray Place 2 sprays into both nostrils in the morning.     HYDROcodone bit-homatropine (HYDROMET) 5-1.5 MG/5ML syrup Take 5 mL every 4-6 hours by oral route as needed.     hydrocortisone  (ANUSOL -HC) 2.5 % rectal cream APPLY 1 APPLICATION RECTALLY TWICE DAILY. TO RECTUM FOR HEMORRHOID DISCOMFORT OR BLEEDING 30 g 10   hydrOXYzine (VISTARIL) 25 MG capsule Take 25 mg by mouth daily as needed for itching.     ipratropium-albuterol (DUONEB) 0.5-2.5 (3) MG/3ML SOLN Inhale 3 mLs into the lungs 2 (two) times daily.  ketoconazole  (NIZORAL ) 2 % cream APPLY 1 APPLICATION TOPICALLY TWICE DAILY BETWEEN BUTTOCKS FOLDS FOR YEAST *REFILL REQUEST* 60 g 0   levothyroxine (SYNTHROID) 75 MCG tablet Take 75 mcg by mouth daily.     methocarbamol (ROBAXIN) 500 MG tablet Take 500 mg by mouth daily as needed for muscle spasms.     mirabegron ER (MYRBETRIQ) 25 MG TB24 tablet Take 25 mg by mouth at bedtime.     ondansetron  (ZOFRAN -ODT) 4 MG disintegrating tablet Take 1 tablet (4 mg total) by mouth every 6 (six) hours. 10 tablet 0   Potassium Chloride  ER 20 MEQ TBCR Take 1 tablet by mouth 2 (two) times daily with a meal.     predniSONE  (DELTASONE ) 10 MG tablet Take  4 each am x 2 days,   2 each am x 2 days,  1 each am x 2 days and stop 14 tablet 0   PROLIA 60 MG/ML SOSY injection Inject 60  mg into the skin every 6 (six) months.     simvastatin (ZOCOR) 40 MG tablet Take 40 mg by mouth at bedtime.     sodium chloride  (OCEAN) 0.65 % SOLN nasal spray Place 1 spray into both nostrils as needed for congestion.     No current facility-administered medications for this visit.     Past Surgical History:  Procedure Laterality Date   cataract  surgery Bilateral    COLONOSCOPY     last one was about 3 yrs in [facility]   COLONOSCOPY N/A 02/19/2016   Dr. Shaaron: three 3-6 mm polyps (tubular adenomas) in ascending colon, segmental biopsies consistent with lymphocytic colitis. Prescribed entocort.    COLONOSCOPY N/A 03/06/2019   Dr. Shaaron: 2 polyps removed, tubular adenomas, diverticulosis.  NO random colon biopsies performed.  Next colonoscopy 5 years.   ESOPHAGOGASTRODUODENOSCOPY N/A 08/11/2014   RMR: MIld erosive  reflux esophagitis. small hiatal hernia. Abnormal gastric muocsa uncertain significance. Subtly abnormal duodeanl (bulbar) mucosa- status post multiple biopsies. BENIGN small bowel mucosa, no evidence of villous blulnting, mild reactive gastropathy on stomach biopsy    ESOPHAGOGASTRODUODENOSCOPY (EGD) WITH PROPOFOL  N/A 05/31/2021   normal   ESOPHAGOGASTRODUODENOSCOPY (EGD) WITH PROPOFOL  N/A 07/07/2023   Procedure: ESOPHAGOGASTRODUODENOSCOPY (EGD) WITH PROPOFOL ;  Surgeon: Shaaron Lamar HERO, MD;  Location: [facility];  Service: Endoscopy;  Laterality: N/A;  11:15AM, ASA 3   GALLBLADDER SURGERY     LEG SURGERY     right   MALONEY DILATION N/A 05/31/2021   Procedure: MALONEY DILATION;  Surgeon: Shaaron Lamar HERO, MD;  Location: [facility];  Service: Endoscopy;  Laterality: N/A;   MALONEY DILATION N/A 07/07/2023   Procedure: AGAPITO DILATION;  Surgeon: Shaaron Lamar HERO, MD;  Location: [facility];  Service: Endoscopy;  Laterality: N/A;   PARTIAL HYSTERECTOMY       Allergies  Allergen Reactions   Desyrel [Trazodone] Other (See Comments)    Body shaking    Keflex  [Cephalexin] Other (See Comments)    (Unknown)   Klonopin [Clonazepam] Other (See Comments)    (Unknown)   Minocin [Minocycline Hcl] Other (See Comments)    (Unknown)   Stelazine [Trifluoperazine]     Mouth was crossing over    Viberzi  [Eluxadoline ]     abd pain, dries out eyes,       Family History  Problem Relation Age of Onset   Cancer Sister        blood disorder ??   Colon cancer Neg Hx      Social History  Ms. Gittings reports that she has been smoking cigarettes. She started smoking about 60 years ago. She has a 30.3 pack-year smoking history. She has been exposed to tobacco smoke. She has never used smokeless tobacco. Ms. Epting reports no history of alcohol use.     Physical Examination Today's Vitals   03/15/24 1519  BP: 120/77  Pulse: 84  SpO2: 94%  Weight: 148 lb (67.1 kg)  Height: 5' 4 (1.626 m)   Body mass index is 25.4 kg/m.  Gen: resting comfortably, no acute distress HEENT: no scleral icterus, pupils equal round and reactive, no palptable cervical adenopathy,  CV: RRR, no m/rg, no jvd Resp: Clear to auscultation bilaterally GI: abdomen is soft, non-tender, non-distended, normal bowel sounds, no hepatosplenomegaly MSK: extremities are warm, no edema.  Skin: warm, no rash Neuro:  no focal deficits Psych: appropriate affect   Diagnostic Studies 11/2020 echo 1. Left ventricular ejection fraction, by estimation, is 60 to 65%. The  left ventricle has normal function. The left ventricle has no regional  wall motion abnormalities. Left ventricular diastolic parameters are  consistent with Grade I diastolic  dysfunction (impaired relaxation).   2. Right ventricular systolic function is normal. The right ventricular  size is normal.   3. The mitral valve is normal in structure. No evidence of mitral valve  regurgitation. No evidence of mitral stenosis.   4. The aortic valve has an indeterminant number of cusps. Aortic valve  regurgitation is trivial.    5. The inferior vena cava is normal in size with greater than 50%  respiratory variability, suggesting right atrial pressure of 3 mmHg.     Assessment and Plan  LE edema - prior echos had just show mild diastolic dysfunction - swelling has resolved, no longer requiring any diuretics - she will continue to monitor at home for recurrence  2. HLD - at goal, continue current meds  F/u as needed     Dorn PHEBE Ross, M.D.

## 2024-04-03 DIAGNOSIS — J449 Chronic obstructive pulmonary disease, unspecified: Secondary | ICD-10-CM | POA: Diagnosis not present

## 2024-04-03 DIAGNOSIS — Z515 Encounter for palliative care: Secondary | ICD-10-CM | POA: Diagnosis not present

## 2024-04-04 ENCOUNTER — Other Ambulatory Visit (HOSPITAL_COMMUNITY): Payer: Self-pay | Admitting: Internal Medicine

## 2024-04-04 DIAGNOSIS — E782 Mixed hyperlipidemia: Secondary | ICD-10-CM | POA: Diagnosis not present

## 2024-04-04 DIAGNOSIS — E039 Hypothyroidism, unspecified: Secondary | ICD-10-CM | POA: Diagnosis not present

## 2024-04-04 DIAGNOSIS — Z1231 Encounter for screening mammogram for malignant neoplasm of breast: Secondary | ICD-10-CM

## 2024-04-11 DIAGNOSIS — J449 Chronic obstructive pulmonary disease, unspecified: Secondary | ICD-10-CM | POA: Diagnosis not present

## 2024-04-11 DIAGNOSIS — R7303 Prediabetes: Secondary | ICD-10-CM | POA: Diagnosis not present

## 2024-04-11 DIAGNOSIS — E782 Mixed hyperlipidemia: Secondary | ICD-10-CM | POA: Diagnosis not present

## 2024-04-11 DIAGNOSIS — R600 Localized edema: Secondary | ICD-10-CM | POA: Diagnosis not present

## 2024-04-11 DIAGNOSIS — F17210 Nicotine dependence, cigarettes, uncomplicated: Secondary | ICD-10-CM | POA: Diagnosis not present

## 2024-04-11 DIAGNOSIS — E039 Hypothyroidism, unspecified: Secondary | ICD-10-CM | POA: Diagnosis not present

## 2024-04-11 DIAGNOSIS — N3281 Overactive bladder: Secondary | ICD-10-CM | POA: Diagnosis not present

## 2024-04-11 DIAGNOSIS — R778 Other specified abnormalities of plasma proteins: Secondary | ICD-10-CM | POA: Diagnosis not present

## 2024-04-11 DIAGNOSIS — K7689 Other specified diseases of liver: Secondary | ICD-10-CM | POA: Diagnosis not present

## 2024-04-11 DIAGNOSIS — M450 Ankylosing spondylitis of multiple sites in spine: Secondary | ICD-10-CM | POA: Diagnosis not present

## 2024-04-11 DIAGNOSIS — K529 Noninfective gastroenteritis and colitis, unspecified: Secondary | ICD-10-CM | POA: Diagnosis not present

## 2024-04-11 DIAGNOSIS — M810 Age-related osteoporosis without current pathological fracture: Secondary | ICD-10-CM | POA: Diagnosis not present

## 2024-05-06 ENCOUNTER — Ambulatory Visit (HOSPITAL_COMMUNITY)
Admission: RE | Admit: 2024-05-06 | Discharge: 2024-05-06 | Disposition: A | Discharge status: Home / Self Care | Source: Ambulatory Visit | Attending: Internal Medicine | Admitting: Internal Medicine

## 2024-05-06 DIAGNOSIS — J449 Chronic obstructive pulmonary disease, unspecified: Secondary | ICD-10-CM | POA: Insufficient documentation

## 2024-05-06 DIAGNOSIS — F17210 Nicotine dependence, cigarettes, uncomplicated: Secondary | ICD-10-CM | POA: Insufficient documentation

## 2024-05-06 DIAGNOSIS — Z1231 Encounter for screening mammogram for malignant neoplasm of breast: Secondary | ICD-10-CM | POA: Diagnosis not present

## 2024-05-07 ENCOUNTER — Ambulatory Visit (HOSPITAL_COMMUNITY)
Admission: RE | Admit: 2024-05-07 | Discharge: 2024-05-07 | Disposition: A | Source: Ambulatory Visit | Attending: Internal Medicine | Admitting: Internal Medicine

## 2024-05-07 ENCOUNTER — Ambulatory Visit: Admitting: Internal Medicine

## 2024-05-07 DIAGNOSIS — J449 Chronic obstructive pulmonary disease, unspecified: Secondary | ICD-10-CM

## 2024-05-07 DIAGNOSIS — Z1231 Encounter for screening mammogram for malignant neoplasm of breast: Secondary | ICD-10-CM | POA: Diagnosis not present

## 2024-05-07 DIAGNOSIS — F17210 Nicotine dependence, cigarettes, uncomplicated: Secondary | ICD-10-CM

## 2024-05-07 LAB — PULMONARY FUNCTION TEST
DL/VA % pred: 85 %
DL/VA: 3.54 ml/min/mmHg/L
DLCO unc % pred: 60 %
DLCO unc: 11.37 ml/min/mmHg
FEF 25-75 Post: 0.68 L/s
FEF 25-75 Pre: 0.31 L/s
FEF2575-%Change-Post: 118 %
FEF2575-%Pred-Post: 43 %
FEF2575-%Pred-Pre: 19 %
FEV1-%Change-Post: 25 %
FEV1-%Pred-Post: 57 %
FEV1-%Pred-Pre: 46 %
FEV1-Post: 1.18 L
FEV1-Pre: 0.94 L
FEV1FVC-%Change-Post: 1 %
FEV1FVC-%Pred-Pre: 67 %
FEV6-%Change-Post: 24 %
FEV6-%Pred-Post: 80 %
FEV6-%Pred-Pre: 65 %
FEV6-Post: 2.09 L
FEV6-Pre: 1.69 L
FEV6FVC-%Change-Post: 0 %
FEV6FVC-%Pred-Post: 95 %
FEV6FVC-%Pred-Pre: 95 %
FVC-%Change-Post: 24 %
FVC-%Pred-Post: 84 %
FVC-%Pred-Pre: 68 %
FVC-Post: 2.32 L
FVC-Pre: 1.87 L
Post FEV1/FVC ratio: 51 %
Post FEV6/FVC ratio: 90 %
Pre FEV1/FVC ratio: 50 %
Pre FEV6/FVC Ratio: 90 %
RV % pred: 144 %
RV: 3.37 L
TLC % pred: 104 %
TLC: 5.29 L

## 2024-05-07 MED ORDER — ALBUTEROL SULFATE (2.5 MG/3ML) 0.083% IN NEBU
2.5000 mg | INHALATION_SOLUTION | Freq: Once | RESPIRATORY_TRACT | Status: AC
Start: 2024-05-07 — End: 2024-05-07
  Administered 2024-05-07: 2.5 mg via RESPIRATORY_TRACT

## 2024-05-09 ENCOUNTER — Ambulatory Visit: Admitting: Internal Medicine

## 2024-05-09 ENCOUNTER — Telehealth: Payer: Self-pay | Admitting: Internal Medicine

## 2024-05-09 ENCOUNTER — Encounter: Payer: Self-pay | Admitting: Internal Medicine

## 2024-05-09 VITALS — BP 104/66 | HR 83 | Ht 64.0 in | Wt 155.0 lb

## 2024-05-09 DIAGNOSIS — J449 Chronic obstructive pulmonary disease, unspecified: Secondary | ICD-10-CM

## 2024-05-09 DIAGNOSIS — R058 Other specified cough: Secondary | ICD-10-CM

## 2024-05-09 DIAGNOSIS — F17210 Nicotine dependence, cigarettes, uncomplicated: Secondary | ICD-10-CM

## 2024-05-09 NOTE — Patient Instructions (Addendum)
 Correction For cough/ congestion >  Mucinex dm maximum of  1200 mg every 12 hours as needed and if not better add Try prilosec otc 20mg   Take 30-60 min before first meal of the day and Pepcid ac (famotidine) 20 mg one @  bedtime until cough is completely gone for at least a week without the need for cough suppression   Corporate treasurer ( not the white ones ) to help   If throat congestion not better, let me refer you to a throat specialist   Plan A = Automatic = Always=    Breztri Take 2 puffs first thing in am and then another 2 puffs about 12 hours later.    Work on inhaler technique:  relax and gently blow all the way out then take a nice smooth full deep breath back in, triggering the inhaler at same time you start breathing in.  Hold breath in for at least  5 seconds if you can. Blow out breztri  thru nose. Rinse and gargle with water  when done.  If mouth or throat bother you at all,  try brushing teeth/gums/tongue with arm and hammer toothpaste/ make a slurry and gargle and spit out.       Plan B = Backup (to supplement plan A, not to replace it) Use your albuterol inhaler as a rescue medication to be used if you can't catch your breath by resting or slowing your pace  or doing a relaxed purse lip breathing pattern.  - The less you use it, the better it will work when you need it. - Ok to use the inhaler up to 2 puffs  every 4 hours if you must but call for appointment if use goes up over your usual need - Don't leave home without it !!  (think of it like the spare tire or starter fluid for your car)   Plan C = Crisis (instead of Plan B but only if Plan B stops working) - only use your albuterol nebulizer if you first try Plan B and it fails to help > ok to use the nebulizer up to every 4 hours but if start needing it regularly call for immediate appointment    Please schedule a follow up visit in 6 months but call sooner if needed

## 2024-05-09 NOTE — Assessment & Plan Note (Addendum)
 Onset shortly p quit smoking Mar 31 2024  -  05/09/2024 absent noct with sense of globus / thick mucus daytime only  - 05/09/2024 rec hard rock candy/ mucinex and consider adding LPR rx  >>>  f/u ent prn   Each maintenance medication was reviewed in detail including emphasizing most importantly the difference between maintenance and prns and under what circumstances the prns are to be triggered using an action plan format where appropriate.  Total time for H and P, chart review, counseling, reviewing hfa/ neb device(s) , directly observing portions of ambulatory 02 saturation study/ and generating customized AVS unique to this office visit / same day charting = 45 min > 30 min for multiple  refractory respiratory  symptoms of uncertain etiology

## 2024-05-09 NOTE — Telephone Encounter (Signed)
 Error in 1st paragraph of AVS   Correction   For cough/ congestion >  Mucinex dm maximum of  1200 mg every 12 hours as needed and if not better add Try prilosec otc 20mg   Take 30-60 min before first meal of the day and Pepcid ac (famotidine) 20 mg one @  bedtime until cough is completely gone for at least a week without the need for cough suppression

## 2024-05-09 NOTE — Assessment & Plan Note (Addendum)
 Quit smoking Aug 31 2025MM - onset of symptoms of doe/ cough x 2021 - LDSCT 05/06/22  very mild centrilobular and paraseptal emphysema  - 11/28/2023  After extensive coaching inhaler device,  effectiveness =    25%  - 11/28/2023 added bud 0.25 bid to duoneb  - Allergy screen 11/28/2023 >  Eos 0. 2/  IgE  10 and alpha one Phenotype MM level 156  - 11/28/2023   Walked on RA  x  3  lap(s) =  approx 450  ft  @ mod pace, stopped due to back pain with lowest 02 sats 93% but ended at 94%   LDSCT  01/12/24 RADS Mild centrilobular emphysema  - PFT's  05/07/24  FEV1 1.18 (57 % ) ratio 0.51  p 25 % improvement from saba p ? breztri prior to study with DLCO  11.4 (60%)   and FV curve classically concave   - 05/09/2024   Walked on RA   x  3  lap(s) =  approx 450  ft  @ mod fast  pace, stopped due to end of study s sob  with lowest 02 sats 93%      - 05/09/2024  After extensive coaching inhaler device,  effectiveness =    75% HF (poor breath hold)  >>>  continue beztri and approb saba   Re SABA :  I spent extra time with pt today reviewing appropriate use of albuterol for prn use on exertion with the following points: 1) saba is for relief of sob that does not improve by walking a slower pace or resting but rather if the pt does not improve after trying this first. 2) If the pt is convinced, as many are, that saba helps recover from activity faster then it's easy to tell if this is the case by re-challenging : ie stop, take the inhaler, then p 5 minutes try the exact same activity (intensity of workload) that just caused the symptoms and see if they are substantially diminished or not after saba 3) if there is an activity that reproducibly causes the symptoms, try the saba 15 min before the activity on alternate days   If in fact the saba really does help, then fine to continue to use it prn but advised may need to look closer at the maintenance regimen being used to achieve better control of airways disease with  exertion.   If symptoms of mb to house doe  persist may need cards eval next though we could not reproduce it here in office x 450 ft fast walk.

## 2024-05-09 NOTE — Progress Notes (Signed)
 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   Add budesonide  0.25 mg  with the AM duoneb and the PM duoneb (supper time) Time your activities at least 15 min after your nebulizer  For cough congestion > Mucinex dm 1200 mg every 12 hours as needed  The key is to stop smoking completely before smoking completely stops you! Prednisone  10 mg take  4 each am x 2 days,   2 each am x 2 days,  1 each am x 2 days and stop PFTs prior to  return Please schedule a follow up visit in 3 months but call sooner if needed  with all medications /inhalers/ solutions in hand      12/13/2023  f/u ov/Jennifer [name] re: doe/ cough    maint on DUONEB QID   DID NOT BRING meds as requested / no pfts yet  Chief Complaint  Patient presents with   Follow-up    F/u on cxr   Dyspnea:  IMPROVING  Cough: NONE  Sleeping: ARM OF COUCH  x 2.5 years s resp cc  SABA use: none  02: none  Lung cancer screening :  not since 05/06/22  = RADS 2 c/w mild centrilobular emphysema > referred  back 12/13/2023  Rec The key is to stop smoking completely before smoking completely stops you! See your eye doctor or Dr Jama at [facility]   LDSCT  01/12/24 RADS Mild centrilobular emphysema  - PFT's  05/07/24  FEV1 1.18 (57 % ) ratio 0.51  p 25 % improvement from saba p ? breztri prior to study with DLCO  11.4 (60%)   and FV curve classically concave     05/09/2024  f/u ov/Jennifer [name] office/Jennifer [name] mz:RNEI GOLD 2 / AB   maint on breztri 2bid   Chief Complaint  Patient presents with   Shortness of Breath    Pft 10/8 Thick musus that wont move   Dyspnea:  mb and back 50 ft flat gives out (not reproduced in office 05/09/2024) Cough: sporadic daytime sensation of globus but min thick white mucus production  Sleeping: flat on couch  one pillow no  resp cc - cough / mucus congestion do not awaken SABA use: up to one a day and once a week neb  02: none     No obvious day to day  or daytime variability or assoc  purulent sputum or mucus plugs or hemoptysis or cp or chest tightness, subjective wheeze or overt sinus or hb symptoms.    Also denies any obvious fluctuation of symptoms with weather or environmental changes or other aggravating or alleviating factors except as outlined above   No unusual exposure hx or h/o childhood pna/ asthma or knowledge of premature birth.  Current Allergies, Complete Past Medical History, Past Surgical History, Family History, and Social History were reviewed in [employer] record.  ROS  The following are not active complaints unless bolded Hoarseness, sore throat, dysphagia/globus sensation, dental problems, itching, sneezing,  nasal congestion or discharge of excess mucus or purulent secretions, ear ache,   fever, chills, sweats, unintended wt loss or wt gain, classically pleuritic or exertional cp,  orthopnea pnd or arm/hand swelling  or leg swelling, presyncope, palpitations, abdominal pain, anorexia, nausea, vomiting, diarrhea  or  change in bowel habits or change in bladder habits, change in stools or change in urine, dysuria, hematuria,  rash, arthralgias, visual complaints, headache, numbness, weakness or ataxia or problems with walking or coordination,  change in mood or  memory.        Current Meds  Medication Sig   acetaminophen (TYLENOL) 650 MG CR tablet Take 650 mg by mouth every 8 (eight) hours as needed for pain.   albuterol (VENTOLIN HFA) 108 (90 Base) MCG/ACT inhaler Inhale 1-2 puffs into the lungs every 6 (six) hours as needed for shortness of breath or wheezing.   bismuth  subsalicylate (STOMACH RELIEF) 262 MG chewable tablet chew 3 TABLETS BY MOUTH THREE TIMES DAILY   budesonide  (PULMICORT ) 0.25 MG/2ML nebulizer solution Take 2 mLs (0.25 mg total) by nebulization 2 (two) times daily.   budesonide -glycopyrrolate-formoterol (BREZTRI AEROSPHERE) 160-9-4.8 MCG/ACT AERO inhaler Inhale 2 puffs into the lungs.   cycloSPORINE (RESTASIS) 0.05 % ophthalmic emulsion Place 1 drop into both eyes in the morning, at noon, and at bedtime.   diclofenac Sodium (VOLTAREN) 1 % GEL Apply 2 g topically daily as needed (pain).   dicyclomine  (BENTYL ) 10 MG capsule TAKE 1 CAPSULE BY MOUTH EVERY MORNING, TAKE 1 CAPSULE AT NOON, TAKE 1 CAPSULE EACH EVENING AND TAKE 1 CAPSULE AT BEDTIME   fluticasone (FLONASE) 50 MCG/ACT nasal spray Place 2 sprays into both nostrils in the morning.   HYDROcodone bit-homatropine (HYDROMET) 5-1.5 MG/5ML syrup Take 5 mL every 4-6 hours by oral route as needed.   hydrocortisone  (ANUSOL -HC) 2.5 % rectal cream APPLY 1 APPLICATION RECTALLY TWICE DAILY. TO RECTUM FOR HEMORRHOID DISCOMFORT OR BLEEDING   hydrOXYzine (VISTARIL) 25 MG capsule Take 25 mg by mouth daily as needed for itching.   ipratropium-albuterol (DUONEB) 0.5-2.5 (3) MG/3ML SOLN Inhale 3 mLs into the lungs 2 (two) times daily.   levothyroxine (SYNTHROID) 75 MCG tablet Take 75 mcg by mouth daily.   methocarbamol (ROBAXIN) 500 MG tablet Take 500 mg by  mouth daily as needed for muscle spasms.   mirabegron ER (MYRBETRIQ) 25 MG TB24 tablet Take 25 mg by mouth at bedtime.   PROLIA 60 MG/ML SOSY injection Inject 60 mg into the skin every 6 (six) months.   simvastatin (ZOCOR) 40 MG tablet Take 40 mg by mouth at bedtime.   sodium chloride  (OCEAN) 0.65 % SOLN nasal spray Place 1 spray into both nostrils as needed for congestion.   varenicline (CHANTIX) 1 MG tablet Take 1 mg by mouth 2 (two) times daily.  Past Medical History:  Diagnosis Date   Complication of anesthesia    difficulty waking up after hysterectomy   COPD (chronic obstructive pulmonary disease) (HCC)    Fatty liver    GERD (gastroesophageal reflux disease)    History of colon polyps    remote past, unable to retrieve records    Hyperlipidemia    Hypertension    Hypothyroidism    Lymphocytic colitis    Rheumatoid arthritis (HCC)       Objective:    Wts  05/09/2024        155   12/13/23 139 lb (63 kg)  11/28/23 139 lb (63 kg)  08/22/23 133 lb 3.2 oz (60.4 kg)       Somber amb wf nad/ occ throat clearing but on voluntary cough no rattling at all  HEENT : Oropharynx  no PND  Nasal turbinates nl    NECK :  without  apparent JVD/ palpable Nodes/TM    LUNGS: no acc muscle use,  Mild barrel  contour chest wall with bilateral  Distant bs s audible wheeze and  without cough on insp or exp maneuvers  and mild  Hyperresonant  to  percussion bilaterally     CV:  RRR  no s3 or murmur or increase in P2, and no edema   ABD:  soft and nontender   MS:  Nl gait/ ext warm without deformities Or obvious joint restrictions  calf tenderness, cyanosis or clubbing     SKIN: warm and dry without lesions    NEURO:  alert, approp, nl sensorium with  no motor or cerebellar deficits apparent.        Assessment   Assessment & Plan GOLD ? / still smoking  Quit smoking Aug 31 2025MM - onset of symptoms of doe/ cough x 2021 - LDSCT 05/06/22  very mild  centrilobular and paraseptal emphysema  - 11/28/2023  After extensive coaching inhaler device,  effectiveness =    25%  - 11/28/2023 added bud 0.25 bid to duoneb  - Allergy screen 11/28/2023 >  Eos 0. 2/  IgE  10 and alpha one Phenotype MM level 156  - 11/28/2023   Walked on RA  x  3  lap(s) =  approx 450  ft  @ mod pace, stopped due to back pain with lowest 02 sats 93% but ended at 94%   LDSCT  01/12/24 RADS Mild centrilobular emphysema  - PFT's  05/07/24  FEV1 1.18 (57 % ) ratio 0.51  p 25 % improvement from saba p ? breztri prior to study with DLCO  11.4 (60%)   and FV curve classically concave   - 05/09/2024   Walked on RA   x  3  lap(s) =  approx 450  ft  @ mod fast  pace, stopped due to end of study s sob  with lowest 02 sats 93%      - 05/09/2024  After extensive coaching inhaler device,  effectiveness =    75% HF (poor breath hold)  >>>  continue beztri and approb saba   Re SABA :  I spent extra time with pt today reviewing appropriate use of albuterol for prn use on exertion with the following points: 1) saba is for relief of sob that does not improve by walking a slower pace or resting but rather if the pt does not improve after trying this first. 2) If the pt is convinced, as many are, that saba helps recover from activity  faster then it's easy to tell if this is the case by re-challenging : ie stop, take the inhaler, then p 5 minutes try the exact same activity (intensity of workload) that just caused the symptoms and see if they are substantially diminished or not after saba 3) if there is an activity that reproducibly causes the symptoms, try the saba 15 min before the activity on alternate days   If in fact the saba really does help, then fine to continue to use it prn but advised may need to look closer at the maintenance regimen being used to achieve better control of airways disease with exertion.   If symptoms of mb to house doe  persist may need cards eval next though we could not  reproduce it here in office x 450 ft fast walk.  Upper airway cough syndrome Onset shortly p quit smoking Mar 31 2024  -  05/09/2024 absent noct with sense of globus / thick mucus daytime only  - 05/09/2024 rec hard Jennifer candy/ mucinex and consider adding LPR rx  >>>  f/u ent prn   Each maintenance medication was reviewed in detail including emphasizing most importantly the difference between maintenance and prns and under what circumstances the prns are to be triggered using an action plan format where appropriate.  Total time for H and P, chart review, counseling, reviewing hfa/ neb device(s) , directly observing portions of ambulatory 02 saturation study/ and generating customized AVS unique to this office visit / same day charting = 45 min > 30 min for multiple  refractory respiratory  symptoms of uncertain etiology                    AVS  Patient Instructions  Correction For cough/ congestion >  Mucinex dm maximum of  1200 mg every 12 hours as needed and if not better add Try prilosec otc 20mg   Take 30-60 min before first meal of the day and Pepcid ac (famotidine) 20 mg one @  bedtime until cough is completely gone for at least a week without the need for cough suppression   Corporate treasurer ( not the white ones ) to help   If throat congestion not better, let me refer you to a throat specialist   Plan A = Automatic = Always=    Breztri Take 2 puffs first thing in am and then another 2 puffs about 12 hours later.    Work on inhaler technique:  relax and gently blow all the way out then take a nice smooth full deep breath back in, triggering the inhaler at same time you start breathing in.  Hold breath in for at least  5 seconds if you can. Blow out breztri  thru nose. Rinse and gargle with water  when done.  If mouth or throat bother you at all,  try brushing teeth/gums/tongue with arm and hammer toothpaste/ make a slurry and gargle and spit out.       Plan B = Backup  (to supplement plan A, not to replace it) Use your albuterol inhaler as a rescue medication to be used if you can't catch your breath by resting or slowing your pace  or doing a relaxed purse lip breathing pattern.  - The less you use it, the better it will work when you need it. - Ok to use the inhaler up to 2 puffs  every 4 hours if you must but call for appointment if use goes up over  your usual need - Don't leave home without it !!  (think of it like the spare tire or starter fluid for your car)   Plan C = Crisis (instead of Plan B but only if Plan B stops working) - only use your albuterol nebulizer if you first try Plan B and it fails to help > ok to use the nebulizer up to every 4 hours but if start needing it regularly call for immediate appointment    Please schedule a follow up visit in 6 months but call sooner if needed    Ozell America, MD 05/09/2024

## 2024-05-15 ENCOUNTER — Ambulatory Visit: Payer: Self-pay | Admitting: Internal Medicine

## 2024-06-08 LAB — COLOGUARD: COLOGUARD: NEGATIVE

## 2024-07-18 ENCOUNTER — Ambulatory Visit: Admitting: Internal Medicine

## 2024-07-18 ENCOUNTER — Ambulatory Visit: Payer: Self-pay | Admitting: Internal Medicine

## 2024-07-18 ENCOUNTER — Encounter: Payer: Self-pay | Admitting: Internal Medicine

## 2024-07-18 VITALS — BP 109/71 | HR 83 | Ht 64.0 in | Wt 154.0 lb

## 2024-07-18 DIAGNOSIS — Z87891 Personal history of nicotine dependence: Secondary | ICD-10-CM | POA: Diagnosis not present

## 2024-07-18 DIAGNOSIS — R058 Other specified cough: Secondary | ICD-10-CM

## 2024-07-18 DIAGNOSIS — J449 Chronic obstructive pulmonary disease, unspecified: Secondary | ICD-10-CM | POA: Diagnosis not present

## 2024-07-18 NOTE — Progress Notes (Unsigned)
 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   Add budesonide  0.25 mg  with the AM duoneb and the PM duoneb (supper time) Time your activities at least 15 min after your nebulizer  For cough congestion > Mucinex dm 1200 mg every 12 hours as needed  The key is to stop smoking completely before smoking completely stops you! Prednisone  10 mg take  4 each am x 2 days,   2 each am x 2 days,  1 each am x 2 days and stop PFTs prior to  return Please schedule a follow up visit in 3 months but call sooner if needed  with all medications /inhalers/ solutions in hand      12/13/2023  f/u ov/Deashia Soule re: doe/ cough    maint on DUONEB QID   DID NOT BRING meds as requested / no pfts yet  Chief Complaint  Patient presents with   Follow-up    F/u on cxr   Dyspnea:  IMPROVING  Cough: NONE  Sleeping: ARM OF COUCH  x 2.5 years s resp cc  SABA use: none  02: none  Lung cancer screening :  not since 05/06/22  = RADS 2 c/w mild centrilobular emphysema > referred  back 12/13/2023  Rec The key is to stop smoking completely before smoking completely stops you! See your eye doctor or Dr Jama at [facility]   LDSCT  01/12/24 RADS Mild centrilobular emphysema  - PFT's  05/07/24  FEV1 1.18 (57 % ) ratio 0.51  p 25 % improvement from saba p ? breztri prior to study with DLCO  11.4 (60%)   and FV curve classically concave     05/09/2024  f/u ov/[place] office/Jonquil Stubbe mz:RNEI GOLD 2 / AB   maint on breztri 2bid   Chief Complaint  Patient presents with   Shortness of Breath    Pft 10/8 Thick musus that wont move   Dyspnea:  mb and back 50 ft flat gives out (not reproduced in office 05/09/2024) Cough: sporadic daytime sensation of globus but min thick white mucus production  Sleeping: flat on couch  one pillow no  resp cc - cough / mucus congestion do not awaken SABA use: up to one a day and once a week neb  02: none  Patient Instructions  Correction For cough/ congestion >  Mucinex dm maximum of  1200 mg every 12 hours as needed and if not better add Try prilosec otc 20mg   Take 30-60 min before first meal of the day and Pepcid ac (famotidine) 20 mg one @  bedtime until cough is completely gone for at least a week without the need for cough suppression Corporate treasurer ( not the white ones ) to help  If throat congestion not better, let me refer you to a throat specialist  Plan A = Automatic = Always=    Breztri Take 2 puffs first thing in am and then another 2 puffs about 12 hours later.  Work on inhaler technique:   Plan B = Backup (to supplement plan A, not to replace it) Use your albuterol  inhaler as a rescue medication  Plan C = Crisis (instead of Plan B but only if Plan B stops working) - only use your albuterol  nebulizer if you first try Plan B   07/18/2024  f/u ov/[place] office/Lesley Galentine re: GOLD 2 copd  maint on ?  Really not clear at this  point what she is and does not take  Chief Complaint  Patient presents with   Acute Visit     Inhalers are not helping - shob even talking  Coughing clear    Dyspnea:  mailbox  and back 75 each way/ slt hill to MB / going to doctor  Cough: 24/7 more productive p neb not clear  Sleeping: on couch /arm /pillow x  SABA use: not  02: none     No obvious day to day or daytime variability or assoc excess/ purulent sputum or mucus plugs or hemoptysis or cp or chest tightness, subjective wheeze or overt sinus or hb symptoms.    Also denies any obvious fluctuation of symptoms with weather or environmental changes or other aggravating or alleviating factors except as outlined above   No unusual exposure hx or h/o childhood pna/ asthma or knowledge of premature birth.  Current Allergies, Complete Past Medical History, Past Surgical History, Family History, and Social History were reviewed in [employer] record.  ROS  The following are not active complaints unless bolded Hoarseness, sore throat, dysphagia, dental problems, itching, sneezing,  nasal congestion or discharge of excess mucus or purulent secretions, ear ache,   fever, chills, sweats, unintended wt loss or wt gain, classically pleuritic or exertional cp,  orthopnea pnd or arm/hand swelling  or leg swelling, presyncope, palpitations, abdominal pain, anorexia, nausea, vomiting, diarrhea  or change in bowel habits or change in bladder habits, change in stools or change in urine, dysuria, hematuria,  rash, arthralgias, visual complaints, headache, numbness, weakness or ataxia or problems with walking or coordination,  change in mood or  memory.         Outpatient Medications Prior to Visit  Medication Sig Dispense Refill   Camphor-Menthol-Methyl Sal (SALONPAS) 3.08-06-08 % PTCH Apply 1 Application topically daily as needed (pain).     cycloSPORINE (RESTASIS) 0.05 % ophthalmic emulsion Place 1 drop into both eyes in the morning, at noon, and at bedtime.     dicyclomine  (BENTYL ) 10 MG capsule TAKE 1 CAPSULE BY MOUTH  EVERY MORNING, TAKE 1 CAPSULE AT NOON, TAKE 1 CAPSULE EACH EVENING AND TAKE 1 CAPSULE AT BEDTIME 120 capsule 10   acetaminophen (TYLENOL) 650 MG CR tablet Take 650 mg by mouth every 8 (eight) hours as needed for pain.     albuterol  (VENTOLIN  HFA) 108 (90 Base) MCG/ACT inhaler Inhale  1-2 puffs into the lungs every 6 (six) hours as needed for shortness of breath or wheezing.     bismuth  subsalicylate (STOMACH RELIEF) 262 MG chewable tablet chew 3 TABLETS BY MOUTH THREE TIMES DAILY 270 tablet 1   budesonide  (PULMICORT ) 0.25 MG/2ML nebulizer solution Take 2 mLs (0.25 mg total) by nebulization 2 (two) times daily. 120 mL 12   budesonide -glycopyrrolate-formoterol (BREZTRI AEROSPHERE) 160-9-4.8 MCG/ACT AERO inhaler Inhale 2 puffs into the lungs.     Calcium Carb-Cholecalciferol (CALCIUM 600+D3 PO) Take 2 tablets by mouth in the morning. (Patient not taking: Reported on 07/18/2024)     diclofenac Sodium (VOLTAREN) 1 % GEL Apply 2 g topically daily as needed (pain).     fluticasone (FLONASE) 50 MCG/ACT nasal spray Place 2 sprays into both nostrils in the morning.     HYDROcodone bit-homatropine (HYDROMET) 5-1.5 MG/5ML syrup Take 5 mL every 4-6 hours by oral route as needed.     hydrocortisone  (ANUSOL -HC) 2.5 % rectal cream APPLY 1 APPLICATION RECTALLY TWICE DAILY. TO RECTUM FOR HEMORRHOID DISCOMFORT OR BLEEDING 30 g 10   hydrOXYzine (VISTARIL) 25 MG capsule Take 25 mg by mouth daily as needed for itching.     ipratropium-albuterol  (DUONEB) 0.5-2.5 (3) MG/3ML SOLN Inhale 3 mLs into the lungs 2 (two) times daily.     ketoconazole  (NIZORAL ) 2 % cream APPLY 1 APPLICATION TOPICALLY TWICE DAILY BETWEEN BUTTOCKS FOLDS FOR YEAST *REFILL REQUEST* (Patient not taking: Reported on 05/09/2024) 60 g 0   levothyroxine (SYNTHROID) 75 MCG tablet Take 75 mcg by mouth daily.     methocarbamol (ROBAXIN) 500 MG tablet Take 500 mg by mouth daily as needed for muscle spasms.     mirabegron ER (MYRBETRIQ) 25 MG TB24 tablet Take 25  mg by mouth at bedtime.     ondansetron  (ZOFRAN -ODT) 4 MG disintegrating tablet Take 1 tablet (4 mg total) by mouth every 6 (six) hours. (Patient not taking: Reported on 05/09/2024) 10 tablet 0   Potassium Chloride  ER 20 MEQ TBCR Take 1 tablet by mouth 2 (two) times daily with a meal. (Patient not taking: Reported on 05/09/2024)     predniSONE  (DELTASONE ) 10 MG tablet Take  4 each am x 2 days,   2 each am x 2 days,  1 each am x 2 days and stop (Patient not taking: Reported on 05/09/2024) 14 tablet 0   PROLIA 60 MG/ML SOSY injection Inject 60 mg into the skin every 6 (six) months.     simvastatin (ZOCOR) 40 MG tablet Take 40 mg by mouth at bedtime.     sodium chloride  (OCEAN) 0.65 % SOLN nasal spray Place 1 spray into both nostrils as needed for congestion.     varenicline (CHANTIX) 1 MG tablet Take 1 mg by mouth 2 (two) times daily.     No facility-administered medications prior to visit.           Past Medical History:  Diagnosis Date   Complication of anesthesia    difficulty waking up after hysterectomy   COPD (chronic obstructive pulmonary disease) (HCC)    Fatty liver    GERD (gastroesophageal reflux disease)    History of colon polyps    remote past, unable to retrieve records    Hyperlipidemia    Hypertension    Hypothyroidism    Lymphocytic colitis    Rheumatoid arthritis (HCC)       Objective:    Wts  07/18/2024      ***  05/09/2024  155   12/13/23 139 lb (63 kg)  11/28/23 139 lb (63 kg)  08/22/23 133 lb 3.2 oz (60.4 kg)    Vital signs reviewed  07/18/2024  - Note at rest 02 sats  ***% on ***   General appearance:    ***       Mild bar***        Assessment

## 2024-07-18 NOTE — Patient Instructions (Signed)
 Please remember to go to the  x-ray department  @  [facility] for your tests - we will call you with the results when they are available     My office will be contacting you by phone for referral to sinus CT at [facility]  - if you don't hear back from my office within one week please call us  back or notify us  thru MyChart and we'll address it right away.   Please schedule a follow up office visit in 2-4 weeks, sooner if needed  with all medications /inhalers/ solutions in hand so we can verify exactly what you are taking. This includes all medications from all doctors and over the counters

## 2024-07-18 NOTE — Telephone Encounter (Signed)
 [name] Pulmonary Triage - Initial Assessment Questions Chief Complaint (e.g., cough, sob, wheezing, fever, chills, sweat or additional symptoms) *Go to specific symptom protocol after initial questions. labored  How long have symptoms been present? month  Have you tested for COVID or Flu? Note: If not, ask patient if a home test can be taken. If so, instruct patient to call back for positive results. No  MEDICINES:   Have you used any OTC meds to help with symptoms? Yes If yes, ask What medications? Sinus medication, breztri  OXYGEN: Do you wear supplemental oxygen? No  Do you monitor your oxygen levels? No  FYI Only or Action Required?: FYI only for provider: appointment scheduled on today.  Called Nurse Triage reporting Breathing Problem.  Symptoms began about a month ago.  Interventions attempted: OTC medications: sinus medication and breztri.  Symptoms are: gradually worsening.  Triage Disposition: See HCP Within 4 Hours (Or PCP Triage)  Patient/caregiver understands and will follow disposition?: yes  Copied from CRM #[id number]. Topic: Clinical - Red Word Triage >> Jul 18, 2024 10:23 AM Jennifer [name] wrote: Red Word that prompted transfer to Nurse Triage: Patient states breathing is getting worse every day. Seeking appointment with Dr. Darlean. Reason for Disposition  [1] MILD difficulty breathing (e.g., minimal/no SOB at rest, SOB with walking, pulse < 100) AND [2] NEW-onset or WORSE than normal  Answer Assessment - Initial Assessment Questions 1. RESPIRATORY STATUS: Describe your breathing? (e.g., wheezing, shortness of breath, unable to speak, severe coughing)      labored 2. ONSET: When did this breathing problem begin?      About a month 3. PATTERN Does the difficult breathing come and go, or has it been constant since it started?      intermittent 4. SEVERITY: How bad is your breathing? (e.g., mild, moderate, severe)      Moderate-severe 5.  RECURRENT SYMPTOM: Have you had difficulty breathing before? If Yes, ask: When was the last time? and What happened that time?      denies 6. CARDIAC HISTORY: Do you have any history of heart disease? (e.g., heart attack, angina, bypass surgery, angioplasty)      denies 7. LUNG HISTORY: Do you have any history of lung disease?  (e.g., pulmonary embolus, asthma, emphysema)     COPD 8. CAUSE: What do you think is causing the breathing problem?      unsure 9. OTHER SYMPTOMS: Do you have any other symptoms? (e.g., chest pain, cough, dizziness, fever, runny nose)     Denies  12. TRAVEL: Have you traveled out of the country in the last month? (e.g., travel history, exposures)       denies  Protocols used: Breathing Difficulty-A-AH

## 2024-07-19 NOTE — Telephone Encounter (Signed)
 Pt seen in clinic 12/18

## 2024-07-20 NOTE — Assessment & Plan Note (Addendum)
"   Quit smoking Aug 31 2025MM - onset of symptoms of doe/ cough x 2021 - LDSCT 05/06/22  very mild centrilobular and paraseptal emphysema  - 11/28/2023  After extensive coaching inhaler device,  effectiveness =    25%  - 11/28/2023 added bud 0.25 bid to duoneb  - Allergy screen 11/28/2023 >  Eos 0. 2/  IgE  10 and alpha one Phenotype MM level 156  - 11/28/2023   Walked on RA  x  3  lap(s) =  approx 450  ft  @ mod pace, stopped due to back pain with lowest 02 sats 93% but ended at 94%  LDSCT  01/12/24 . Mild centrilobular emphysema - PFT's  05/07/24  FEV1 1.18 (57 % ) ratio 0.51  p 25 % improvement from saba p ? breztri prior to study with DLCO  11.4 (60%)   and FV curve classically concave  - 05/09/2024   Walked on RA   x  3  lap(s) =  approx 450  ft  @ mod fast  pace, stopped due to end of study s sob  with lowest 02 sats 93%  - 05/09/2024  After extensive coaching inhaler device,  effectiveness =    75% HF (poor breath hold) > continue beztri and approb saba  07/18/2024   Walked on  RA  x  3  lap(s) =  approx 450  ft  @ mod pace, stopped due to end of study  with lowest 02 sats 93% and min sob   Breathing clearly better since quit smoking but still  Group E in terms of symptoms/risk so  laba/lama/ICS  therefore appropriate rx at this point >>>  continue breztri   and approp SABA prn.  Re SABA :  I spent extra time with pt today reviewing appropriate use of albuterol  for prn use on exertion with the following points: 1) saba is for relief of sob that does not improve by walking a slower pace or resting but rather if the pt does not improve after trying this first. 2) If the pt is convinced, as many are, that saba helps recover from activity faster then it's easy to tell if this is the case by re-challenging : ie stop, take the inhaler, then p 5 minutes try the exact same activity (intensity of workload) that just caused the symptoms and see if they are substantially diminished or not after saba 3) if there  is an activity that reproducibly causes the symptoms, try the saba 15 min before the activity on alternate days   If in fact the saba really does help, then fine to continue to use it prn but advised may need to look closer at the maintenance regimen being used to achieve better control of airways disease with exertion.    "

## 2024-07-20 NOTE — Assessment & Plan Note (Addendum)
 [id number]  Allergy screen 11/28/2023 >  Eos 0. 2/  IgE  10 -  05/09/2024 absent noct with sense of globus / thick mucus daytime only  - 05/09/2024 rec hard rock candy/ mucinex and consider adding LPR rx  >>>  f/u ent prn (did not do)   - Sinus CT 07/18/2024 >>>

## 2024-07-27 ENCOUNTER — Ambulatory Visit (HOSPITAL_COMMUNITY)
Admission: RE | Admit: 2024-07-27 | Discharge: 2024-07-27 | Disposition: A | Discharge status: Home / Self Care | Source: Ambulatory Visit | Attending: Internal Medicine | Admitting: Internal Medicine

## 2024-07-27 DIAGNOSIS — R058 Other specified cough: Secondary | ICD-10-CM | POA: Insufficient documentation

## 2024-08-10 ENCOUNTER — Ambulatory Visit: Payer: Self-pay | Admitting: Internal Medicine

## 2024-08-23 ENCOUNTER — Ambulatory Visit: Admitting: Internal Medicine

## 2024-08-23 ENCOUNTER — Encounter: Payer: Self-pay | Admitting: Internal Medicine

## 2024-08-23 ENCOUNTER — Ambulatory Visit (HOSPITAL_COMMUNITY)
Admission: RE | Admit: 2024-08-23 | Discharge: 2024-08-23 | Disposition: A | Source: Ambulatory Visit | Attending: Internal Medicine | Admitting: Internal Medicine

## 2024-08-23 VITALS — BP 125/71 | HR 83 | Ht 64.0 in | Wt 155.0 lb

## 2024-08-23 DIAGNOSIS — J449 Chronic obstructive pulmonary disease, unspecified: Secondary | ICD-10-CM | POA: Diagnosis present

## 2024-08-23 DIAGNOSIS — Z87891 Personal history of nicotine dependence: Secondary | ICD-10-CM

## 2024-08-23 DIAGNOSIS — R058 Other specified cough: Secondary | ICD-10-CM | POA: Diagnosis not present

## 2024-08-23 MED ORDER — BUDESONIDE 0.25 MG/2ML IN SUSP: RESPIRATORY_TRACT | 12 refills | Status: AC

## 2024-08-23 NOTE — Progress Notes (Addendum)
 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   Add budesonide  0.25 mg  with the AM duoneb and the PM duoneb (supper time) Time your activities at least 15 min after your nebulizer  For cough congestion > Mucinex dm 1200 mg every 12 hours as needed  The key is to stop smoking completely before smoking completely stops you! Prednisone  10 mg take  4 each am x 2 days,   2 each am x 2 days,  1 each am x 2 days and stop PFTs prior to  return Please schedule a follow up visit in 3 months but call sooner if needed  with all medications /inhalers/ solutions in hand      12/13/2023  f/u ov/[place] re: doe/ cough    maint on DUONEB QID   DID NOT BRING meds as requested / no pfts yet  Chief Complaint  Patient presents with   Follow-up    F/u on cxr   Dyspnea:  IMPROVING  Cough: NONE  Sleeping: ARM OF COUCH  x 2.5 years s resp cc  SABA use: none  02: none  Lung cancer screening :  not since 05/06/22  = RADS 2 c/w mild centrilobular emphysema > referred  back 12/13/2023  Rec The key is to stop smoking completely before smoking completely stops you! See your eye doctor or Dr [facility]   LDSCT  01/12/24 RADS Mild centrilobular emphysema  - PFT's  05/07/24  FEV1 1.18 (57 % ) ratio 0.51  p 25 % improvement from saba p ? breztri prior to study with DLCO  11.4 (60%)   and FV curve classically concave     05/09/2024  f/u ov/[place] office/Anhelica Fowers mz:RNEI GOLD 2 / AB   maint on breztri 2bid   Chief Complaint  Patient presents with   Shortness of Breath    Pft 10/8 Thick musus that wont move   Dyspnea:  mb and back 50 ft flat gives out (not reproduced in office 05/09/2024) Cough: sporadic daytime sensation of globus but min thick white mucus production  Sleeping: flat on couch  one pillow no  resp cc - cough / mucus congestion do not awaken SABA use: up to one a day and once a week neb  02: none  Patient Instructions  Correction For cough/  congestion >  Mucinex dm maximum of  1200 mg every 12 hours as needed and if not better add Try prilosec otc 20mg   Take 30-60 min before first meal of the day and Pepcid ac (famotidine) 20 mg one @  bedtime until cough is completely gone for at least a week without the need for cough suppression Corporate treasurer ( not the white ones ) to help  If throat congestion not better, let me refer you to a throat specialist  Plan A = Automatic = Always=    Breztri Take 2 puffs first thing in am and then another 2 puffs about 12 hours later.  Work on inhaler technique:   Plan B = Backup (to supplement plan A, not to replace it) Use your albuterol  inhaler as a rescue medication  Plan C = Crisis (instead of Plan B but only if Plan B stops working) - only use your albuterol  nebulizer if you first try Plan B   07/18/2024  f/u ov/[place] office/Michoel Kunin re: GOLD 2 copd  maint on ???  Really not clear at this  point what she is and does not take  Chief Complaint  Patient presents with   Acute Visit     Inhalers are not helping - shob even talking  Coughing clear    Dyspnea:  mailbox  and back 75 each way/ [place] to [place] / going to doctor  Cough: 24/7 more productive p neb/ mostly lcear  Sleeping: on couch /arm /pillow x  SABA use: not using as plan C above 02: none  Patient Instructions   Sinus CT 07/27/24 dev septum to R o/w ok  - if you don't hear back from my office within one week please call us  back or notify us  thru MyChart and we'll address it right away.  Please schedule a follow up office visit in 2-4 weeks, sooner if needed  with all medications /inhalers/ solutions in hand   08/23/2024  f/u ov/[place] office/Tyjai Charbonnet re: GOLD 2 COPD  maint on breztri    Chief Complaint  Patient presents with   COPD    No resp symptoms today / got air purifier has help   Cxr not comp. CT 1/10    Dyspnea:  no change in ex tol / very sedentary though  Cough: better - thinks it's from the air purifier she bought - but still can't breathe thru R side of  nose   No obvious day to day or daytime variability or assoc excess/ purulent sputum or mucus plugs or hemoptysis or cp or chest tightness, subjective wheeze or overt sinus or hb symptoms.    Also denies any obvious fluctuation of symptoms with weather or environmental changes or other aggravating or alleviating factors except as outlined above   No unusual exposure hx or h/o childhood pna/ asthma or knowledge of premature birth.  Current Allergies, Complete Past Medical History, Past Surgical History, Family History, and Social History were reviewed in [employer] record.  ROS  The following are not active complaints unless bolded Hoarseness, sore throat, dysphagia, dental problems, itching, sneezing,  nasal congestion or discharge of excess mucus or purulent secretions, ear ache,   fever, chills, sweats, unintended wt loss or wt gain, classically pleuritic or exertional cp,  orthopnea pnd or arm/hand swelling  or leg  swelling, presyncope, palpitations, abdominal pain, anorexia, nausea, vomiting, diarrhea  or change in bowel habits or change in bladder habits, change in stools or change in  urine, dysuria, hematuria,  rash, arthralgias, visual complaints, headache, numbness, weakness or ataxia or problems with walking or coordination,  change in mood or  memory.         Outpatient Medications Prior to Visit  Medication Sig Dispense Refill   acetaminophen (TYLENOL) 650 MG CR tablet Take 650 mg by mouth every 8 (eight) hours as needed for pain.     albuterol  (VENTOLIN  HFA) 108 (90 Base) MCG/ACT inhaler Inhale 1-2 puffs into the lungs every 6 (six) hours as needed for shortness of breath or wheezing.     bismuth  subsalicylate (STOMACH RELIEF) 262 MG chewable tablet chew 3 TABLETS BY MOUTH THREE TIMES DAILY 270 tablet 1   budesonide  (PULMICORT ) 0.25 MG/2ML nebulizer solution Take 2 mLs (0.25 mg total) by nebulization 2 (two) times daily. 120 mL 12   budesonide -glycopyrrolate-formoterol (BREZTRI AEROSPHERE) 160-9-4.8 MCG/ACT AERO inhaler Inhale 2 puffs into the lungs.     Camphor-Menthol-Methyl Sal (SALONPAS) 3.08-06-08 % PTCH Apply 1 Application topically daily as needed (pain).     cycloSPORINE (RESTASIS) 0.05 % ophthalmic emulsion Place 1 drop into both eyes in the morning, at noon, and at bedtime.     dicyclomine  (BENTYL ) 10 MG capsule TAKE 1 CAPSULE BY MOUTH EVERY MORNING, TAKE 1 CAPSULE AT NOON, TAKE 1 CAPSULE EACH EVENING AND TAKE 1 CAPSULE AT BEDTIME 120 capsule 10   methocarbamol (ROBAXIN) 500 MG tablet Take 500 mg by mouth daily as needed for muscle spasms.     mirabegron ER (MYRBETRIQ) 25 MG TB24 tablet Take 25 mg by mouth at bedtime.     PROLIA 60 MG/ML SOSY injection Inject 60 mg into the skin every 6 (six) months.     simvastatin (ZOCOR) 40 MG tablet Take 40 mg by mouth at bedtime.     Calcium Carb-Cholecalciferol (CALCIUM 600+D3 PO) Take 2 tablets by mouth in the morning. (Patient not taking: Reported on  08/23/2024)     diclofenac Sodium (VOLTAREN) 1 % GEL Apply 2 g topically daily as needed (pain). (Patient not taking: Reported on 08/23/2024)     fluticasone (FLONASE) 50 MCG/ACT nasal spray Place 2 sprays into both nostrils in the morning. (Patient not taking: Reported on 08/23/2024)     HYDROcodone bit-homatropine (HYDROMET) 5-1.5 MG/5ML syrup Take 5 mL every 4-6 hours by oral route as needed. (Patient not taking: Reported on 08/23/2024)     hydrocortisone  (ANUSOL -HC) 2.5 % rectal cream APPLY 1 APPLICATION RECTALLY TWICE DAILY. TO RECTUM FOR HEMORRHOID DISCOMFORT OR BLEEDING (Patient not taking: Reported on 08/23/2024) 30 g 10   hydrOXYzine (VISTARIL) 25 MG capsule Take 25 mg by mouth daily as needed for itching. (Patient not taking: Reported on 08/23/2024)     ipratropium-albuterol  (DUONEB) 0.5-2.5 (3) MG/3ML SOLN Inhale 3 mLs into the lungs 2 (two) times daily. (Patient not taking: Reported on 08/23/2024)     ketoconazole  (NIZORAL ) 2 % cream APPLY 1 APPLICATION TOPICALLY TWICE DAILY BETWEEN BUTTOCKS FOLDS FOR YEAST *REFILL REQUEST* (Patient not taking: Reported on 08/23/2024) 60 g 0   levothyroxine (SYNTHROID) 75 MCG tablet Take 75 mcg by mouth daily. (Patient not taking: Reported on 08/23/2024)     ondansetron  (ZOFRAN -ODT) 4 MG disintegrating tablet Take 1 tablet (4 mg total) by mouth every 6 (six) hours. (Patient not taking: Reported on 08/23/2024) 10 tablet 0   Potassium Chloride  ER 20 MEQ TBCR Take 1 tablet by mouth 2 (two) times daily with a meal. (Patient not taking: Reported on 08/23/2024)     predniSONE  (DELTASONE ) 10 MG  tablet Take  4 each am x 2 days,   2 each am x 2 days,  1 each am x 2 days and stop (Patient not taking: Reported on 08/23/2024) 14 tablet 0   sodium chloride  (OCEAN) 0.65 % SOLN nasal spray Place 1 spray into both nostrils as needed for congestion. (Patient not taking: Reported on 08/23/2024)     varenicline (CHANTIX) 1 MG tablet Take 1 mg by mouth 2 (two) times daily. (Patient not  taking: Reported on 08/23/2024)     No facility-administered medications prior to visit.         Past Medical History:  Diagnosis Date   Complication of anesthesia    difficulty waking up after hysterectomy   COPD (chronic obstructive pulmonary disease) (HCC)    Fatty liver    GERD (gastroesophageal reflux disease)    History of colon polyps    remote past, unable to retrieve records    Hyperlipidemia    Hypertension    Hypothyroidism    Lymphocytic colitis    Rheumatoid arthritis (HCC)       Objective:    Wts  08/23/2024        155  07/18/2024      154  05/09/2024        155   12/13/23 139 lb (63 kg)  11/28/23 139 lb (63 kg)  08/22/23 133 lb 3.2 oz (60.4 kg)    Vital signs reviewed  08/23/2024  - Note at rest 02 sats  93% on RA   General appearance:    pleasant wf nad /  hfa 60 form 30    HEENT : Oropharynx  clear   Nasal turbinates nl    NECK :  without  apparent JVD/ palpable Nodes/TM    LUNGS: no acc muscle use,  Mild barrel  contour chest wall with bilateral  Distant bs s audible wheeze and  without cough on insp or exp maneuvers  and mild  Hyperresonant  to  percussion bilaterally     CV:  RRR  no s3 or murmur or increase in P2, and no edema   ABD:  soft and nontender   MS:  Nl gait/ ext warm without deformities Or obvious joint restrictions  calf tenderness, cyanosis or clubbing     SKIN: warm and dry without lesions    NEURO:  alert, approp, nl sensorium with  no motor or cerebellar deficits apparent.      CXR PA and Lateral:   08/23/2024 :    I personally reviewed images and impression is as follows:     No acute findings/ mod cod   Assessment   Assessment & Plan Upper airway cough syndrome Onset shortly p quit smoking Mar 31 2024  Allergy screen 11/28/2023 >  Eos 0. 2/  IgE  10 -  05/09/2024 absent noct with sense of globus / thick mucus daytime only  - 05/09/2024 rec hard rock candy/ mucinex and consider adding LPR rx  >>>  f/u ent prn  -  Sinus CT  07/27/24  dev septum o/w ok    Having both chronic nasal obst and watery nasaal disharge so >>> ENT eval re deviated septum and rhinitis    COPD mixed type [facility])  Quit smoking Aug 31 2025MM - onset of symptoms of doe/ cough x 2021 - LDSCT 05/06/22  very mild centrilobular and paraseptal emphysema  - 11/28/2023  After extensive coaching inhaler device,  effectiveness =    25%  -  11/28/2023 added bud 0.25 bid to duoneb  - Allergy screen 11/28/2023 >  Eos 0. 2/  IgE  10 and alpha one Phenotype MM level 156  - 11/28/2023   Walked on RA  x  3  lap(s) =  approx 450  ft  @ mod pace, stopped due to back pain with lowest 02 sats 93% but ended at 94%  LDSCT  01/12/24 . Mild centrilobular emphysema - PFT's  05/07/24  FEV1 1.18 (57 % ) ratio 0.51  p 25 % improvement from saba p ? breztri prior to study with DLCO  11.4 (60%)   and FV curve classically concave  - 05/09/2024   Walked on RA   x  3  lap(s) =  approx 450  ft  @ mod fast  pace, stopped due to end of study s sob  with lowest 02 sats 93%  - 05/09/2024  After extensive coaching inhaler device,  effectiveness =    75% HF (poor breath hold) > continue beztri and approb saba  07/18/2024   Walked on  RA  x  3  lap(s) =  approx 450  ft  @ mod pace, stopped due to end of study  with lowest 02 sats 93% and min sob  - 08/23/2024  After extensive coaching inhaler device,  effectiveness =    60% from baseline 30% hfa (Ti too short)   Group E in terms of symptoms/risk so  laba/lama/ICS  therefore appropriate rx at this point  >>>  Breztri   and approp SABA prn    >>>> add budesonide  0.25 mg to neb saba which will be the equivalent of air surpa but follow the ABC plan as below re timing of SABA          Each maintenance medication was reviewed in detail including emphasizing most importantly the difference between maintenance and prns and under what circumstances the prns are to be triggered using an action plan format where appropriate.  Total time  for H and P, chart review, counseling, reviewing hfa/ neb device(s) and generating customized AVS unique to this office visit / same day charting = 35 min               AVS  Patient Instructions  Plan A = Automatic = Always=    Breztri  Take 2 puffs first thing in am and then another 2 puffs about 12 hours later.   Work on inhaler technique:  relax and gently blow all the way out then take a nice smooth full deep breath back in, triggering the inhaler at same time you start breathing in.  Hold breath in for at least  5 seconds if you can. Blow out breztri  thru nose. Rinse and gargle with water  when done.  If mouth or throat bother you at all,  try brushing teeth/gums/tongue with arm and hammer toothpaste/ make a slurry and gargle and spit out.      Plan B = Backup (to supplement plan A, not to replace it) Use your albuterol  inhaler as a rescue medication to be used if you can't catch your breath by resting or slowing your pace  or doing a relaxed purse lip breathing pattern.  - The less you use it, the better it will work when you need it. - Ok to use the inhaler up to 2 puffs  every 4 hours if you must but call for appointment if use goes up over your usual need -  Don't leave home without it !!  (think of it like the spare tire or starter fluid for your car)   Plan C = Crisis (instead of Plan B but only if Plan B stops working) - only use your albuterol - budesonide  nebulizer if you first try Plan B and it fails to help > ok to use the nebulizer up to every 4 hours but if start needing it regularly call for immediate appointment  Also  Ok to try albuterol  15 min before an activity (on alternating days)  that you know would usually make you short of breath and see if it makes any difference and if makes none then don't take albuterol  after activity unless you can't catch your breath as this means it's the resting that helps, not the albuterol .  Please remember to go to the  x-ray  department  @  [facility] for your tests - we will call you with the results when they are available     My office will be contacting you by phone for referral to ENT in [place]  - if you don't hear back from my office within one week please call us  back or notify us  thru MyChart and we'll address it right away.      Please schedule a follow up visit in 3 months but call sooner if needed - bring all your respiratory medications       Ozell America, MD 08/24/2024            "

## 2024-08-23 NOTE — Patient Instructions (Addendum)
 Plan A = Automatic = Always=    Breztri  Take 2 puffs first thing in am and then another 2 puffs about 12 hours later.   Work on inhaler technique:  relax and gently blow all the way out then take a nice smooth full deep breath back in, triggering the inhaler at same time you start breathing in.  Hold breath in for at least  5 seconds if you can. Blow out breztri  thru nose. Rinse and gargle with water  when done.  If mouth or throat bother you at all,  try brushing teeth/gums/tongue with arm and hammer toothpaste/ make a slurry and gargle and spit out.      Plan B = Backup (to supplement plan A, not to replace it) Use your albuterol  inhaler as a rescue medication to be used if you can't catch your breath by resting or slowing your pace  or doing a relaxed purse lip breathing pattern.  - The less you use it, the better it will work when you need it. - Ok to use the inhaler up to 2 puffs  every 4 hours if you must but call for appointment if use goes up over your usual need - Don't leave home without it !!  (think of it like the spare tire or starter fluid for your car)   Plan C = Crisis (instead of Plan B but only if Plan B stops working) - only use your albuterol - budesonide  nebulizer if you first try Plan B and it fails to help > ok to use the nebulizer up to every 4 hours but if start needing it regularly call for immediate appointment  Also  Ok to try albuterol  15 min before an activity (on alternating days)  that you know would usually make you short of breath and see if it makes any difference and if makes none then don't take albuterol  after activity unless you can't catch your breath as this means it's the resting that helps, not the albuterol .  Please remember to go to the  x-ray department  @  [facility] for your tests - we will call you with the results when they are available     My office will be contacting you by phone for referral to ENT in [place]  - if you don't hear back from  my office within one week please call us  back or notify us  thru MyChart and we'll address it right away.      Please schedule a follow up visit in 3 months but call sooner if needed - bring all your respiratory medications

## 2024-08-24 NOTE — Assessment & Plan Note (Addendum)
 Onset shortly p quit smoking Mar 31 2024  Allergy screen 11/28/2023 >  Eos 0. 2/  IgE  10 -  05/09/2024 absent noct with sense of globus / thick mucus daytime only  - 05/09/2024 rec [place] candy/ mucinex and consider adding LPR rx  >>>  f/u ent prn  - Sinus CT  07/27/24  dev septum o/w ok    Having both chronic nasal obst and watery nasaal disharge so >>> ENT eval re deviated septum and rhinitis

## 2024-08-24 NOTE — Assessment & Plan Note (Addendum)
"   Quit smoking Aug 31 2025MM - onset of symptoms of doe/ cough x 2021 - LDSCT 05/06/22  very mild centrilobular and paraseptal emphysema  - 11/28/2023  After extensive coaching inhaler device,  effectiveness =    25%  - 11/28/2023 added bud 0.25 bid to duoneb  - Allergy screen 11/28/2023 >  Eos 0. 2/  IgE  10 and alpha one Phenotype MM level 156  - 11/28/2023   Walked on RA  x  3  lap(s) =  approx 450  ft  @ mod pace, stopped due to back pain with lowest 02 sats 93% but ended at 94%  LDSCT  01/12/24 . Mild centrilobular emphysema - PFT's  05/07/24  FEV1 1.18 (57 % ) ratio 0.51  p 25 % improvement from saba p ? breztri prior to study with DLCO  11.4 (60%)   and FV curve classically concave  - 05/09/2024   Walked on RA   x  3  lap(s) =  approx 450  ft  @ mod fast  pace, stopped due to end of study s sob  with lowest 02 sats 93%  - 05/09/2024  After extensive coaching inhaler device,  effectiveness =    75% HF (poor breath hold) > continue beztri and approb saba  07/18/2024   Walked on  RA  x  3  lap(s) =  approx 450  ft  @ mod pace, stopped due to end of study  with lowest 02 sats 93% and min sob  - 08/23/2024  After extensive coaching inhaler device,  effectiveness =    60% from baseline 30% hfa (Ti too short)   Group E in terms of symptoms/risk so  laba/lama/ICS  therefore appropriate rx at this point  >>>  Breztri   and approp SABA prn    >>>> add budesonide  0.25 mg to neb saba which will be the equivalent of air surpa but follow the ABC plan as below re timing of SABA          Each maintenance medication was reviewed in detail including emphasizing most importantly the difference between maintenance and prns and under what circumstances the prns are to be triggered using an action plan format where appropriate.  Total time for H and P, chart review, counseling, reviewing hfa/ neb device(s) and generating customized AVS unique to this office visit / same day charting = 35 min            "

## 2024-08-26 ENCOUNTER — Ambulatory Visit: Payer: Self-pay | Admitting: Internal Medicine

## 2024-09-16 ENCOUNTER — Ambulatory Visit: Admitting: Internal Medicine

## 2024-09-30 ENCOUNTER — Institutional Professional Consult (permissible substitution) (INDEPENDENT_AMBULATORY_CARE_PROVIDER_SITE_OTHER)

## 2024-11-21 ENCOUNTER — Ambulatory Visit: Admitting: Internal Medicine
